# Patient Record
Sex: Female | Born: 2000 | Race: Black or African American | Hispanic: No | Marital: Single | Smoking: Never smoker
Health system: Southern US, Community
[De-identification: ages and names within clinical notes are randomized; demographics above are authoritative.]

## PROBLEM LIST (undated history)

## (undated) ENCOUNTER — Emergency Department (HOSPITAL_COMMUNITY): Admission: EM | Payer: Self-pay

## (undated) ENCOUNTER — Ambulatory Visit (HOSPITAL_COMMUNITY): Admission: EM | Payer: MEDICAID | Source: Home / Self Care | Attending: Family Medicine | Admitting: Family Medicine

## (undated) DIAGNOSIS — F209 Schizophrenia, unspecified: Secondary | ICD-10-CM

## (undated) HISTORY — DX: Schizophrenia, unspecified: F20.9

---

## 2020-10-15 ENCOUNTER — Encounter (HOSPITAL_COMMUNITY): Payer: Self-pay | Admitting: Emergency Medicine

## 2020-10-15 ENCOUNTER — Other Ambulatory Visit: Payer: Self-pay

## 2020-10-15 ENCOUNTER — Inpatient Hospital Stay (HOSPITAL_COMMUNITY)
Admission: EM | Admit: 2020-10-15 | Discharge: 2020-10-18 | DRG: 346 | Disposition: A | Discharge status: Home / Self Care | Payer: Medicaid Other | Attending: General Surgery | Admitting: General Surgery

## 2020-10-15 ENCOUNTER — Emergency Department (HOSPITAL_COMMUNITY): Payer: Medicaid Other

## 2020-10-15 DIAGNOSIS — K611 Rectal abscess: Principal | ICD-10-CM | POA: Diagnosis present

## 2020-10-15 DIAGNOSIS — F17200 Nicotine dependence, unspecified, uncomplicated: Secondary | ICD-10-CM | POA: Diagnosis present

## 2020-10-15 DIAGNOSIS — Z20822 Contact with and (suspected) exposure to covid-19: Secondary | ICD-10-CM | POA: Diagnosis present

## 2020-10-15 LAB — CBC WITH DIFFERENTIAL/PLATELET
Abs Immature Granulocytes: 0.04 10*3/uL (ref 0.00–0.07)
Basophils Absolute: 0 10*3/uL (ref 0.0–0.1)
Basophils Relative: 0 %
Eosinophils Absolute: 0.1 10*3/uL (ref 0.0–0.5)
Eosinophils Relative: 1 %
HCT: 36.3 % (ref 36.0–46.0)
Hemoglobin: 11.3 g/dL — ABNORMAL LOW (ref 12.0–15.0)
Immature Granulocytes: 0 %
Lymphocytes Relative: 11 %
Lymphs Abs: 1.5 10*3/uL (ref 0.7–4.0)
MCH: 26.9 pg (ref 26.0–34.0)
MCHC: 31.1 g/dL (ref 30.0–36.0)
MCV: 86.4 fL (ref 80.0–100.0)
Monocytes Absolute: 1.1 10*3/uL — ABNORMAL HIGH (ref 0.1–1.0)
Monocytes Relative: 8 %
Neutro Abs: 10.6 10*3/uL — ABNORMAL HIGH (ref 1.7–7.7)
Neutrophils Relative %: 80 %
Platelets: 240 10*3/uL (ref 150–400)
RBC: 4.2 MIL/uL (ref 3.87–5.11)
RDW: 12.4 % (ref 11.5–15.5)
WBC: 13.4 10*3/uL — ABNORMAL HIGH (ref 4.0–10.5)
nRBC: 0 % (ref 0.0–0.2)

## 2020-10-15 LAB — HCG, QUANTITATIVE, PREGNANCY: hCG, Beta Chain, Quant, S: 1 m[IU]/mL (ref <5)

## 2020-10-15 LAB — BASIC METABOLIC PANEL
Anion gap: 10 (ref 5–15)
BUN: 9 mg/dL (ref 6–20)
CO2: 20 mmol/L — ABNORMAL LOW (ref 22–32)
Calcium: 8.9 mg/dL (ref 8.9–10.3)
Chloride: 106 mmol/L (ref 98–111)
Creatinine, Ser: 0.57 mg/dL (ref 0.44–1.00)
GFR, Estimated: >60 mL/min (ref >60)
Glucose, Bld: 92 mg/dL (ref 70–99)
Potassium: 3.6 mmol/L (ref 3.5–5.1)
Sodium: 136 mmol/L (ref 135–145)

## 2020-10-15 LAB — I-STAT BETA HCG BLOOD, ED (MC, WL, AP ONLY): I-stat hCG, quantitative: 6.8 m[IU]/mL — ABNORMAL HIGH (ref <5)

## 2020-10-15 MED ORDER — ONDANSETRON 4 MG PO TBDP: 4.0000 mg | ORAL_TABLET | Freq: Four times a day (QID) | ORAL | Status: DC | PRN

## 2020-10-15 MED ORDER — ZOLPIDEM TARTRATE 5 MG PO TABS: 5.0000 mg | ORAL_TABLET | Freq: Every evening | ORAL | Status: DC | PRN

## 2020-10-15 MED ORDER — CLINDAMYCIN PHOSPHATE 600 MG/50ML IV SOLN
600.0000 mg | Freq: Once | INTRAVENOUS | Status: AC
  Administered 2020-10-15: 600 mg via INTRAVENOUS
  Filled 2020-10-15: qty 50

## 2020-10-15 MED ORDER — FENTANYL CITRATE (PF) 100 MCG/2ML IJ SOLN
50.0000 ug | Freq: Once | INTRAMUSCULAR | Status: AC
  Administered 2020-10-15: 50 ug via INTRAVENOUS
  Filled 2020-10-15: qty 2

## 2020-10-15 MED ORDER — SODIUM CHLORIDE 0.9 % IV BOLUS
1000.0000 mL | Freq: Once | INTRAVENOUS | Status: AC
  Administered 2020-10-15: 1000 mL via INTRAVENOUS

## 2020-10-15 MED ORDER — MORPHINE SULFATE (PF) 4 MG/ML IV SOLN: 4.0000 mg | INTRAVENOUS | Status: DC | PRN

## 2020-10-15 MED ORDER — ACETAMINOPHEN 650 MG RE SUPP: 650.0000 mg | Freq: Four times a day (QID) | RECTAL | Status: DC | PRN

## 2020-10-15 MED ORDER — OXYCODONE HCL 5 MG PO TABS
5.0000 mg | ORAL_TABLET | ORAL | Status: DC | PRN
  Administered 2020-10-16 – 2020-10-17 (×3): 10 mg via ORAL
  Filled 2020-10-15 (×3): qty 2

## 2020-10-15 MED ORDER — ACETAMINOPHEN 325 MG PO TABS
650.0000 mg | ORAL_TABLET | Freq: Four times a day (QID) | ORAL | Status: DC | PRN
  Filled 2020-10-15: qty 2

## 2020-10-15 MED ORDER — ONDANSETRON HCL 4 MG/2ML IJ SOLN
4.0000 mg | Freq: Once | INTRAMUSCULAR | Status: AC
  Administered 2020-10-15: 4 mg via INTRAVENOUS
  Filled 2020-10-15: qty 2

## 2020-10-15 MED ORDER — POTASSIUM CHLORIDE IN NACL 20-0.9 MEQ/L-% IV SOLN
INTRAVENOUS | Status: DC
  Filled 2020-10-15 (×4): qty 1000

## 2020-10-15 MED ORDER — DIPHENHYDRAMINE HCL 50 MG/ML IJ SOLN: 25.0000 mg | Freq: Four times a day (QID) | INTRAMUSCULAR | Status: DC | PRN

## 2020-10-15 MED ORDER — MORPHINE SULFATE (PF) 4 MG/ML IV SOLN
4.0000 mg | Freq: Once | INTRAVENOUS | Status: AC
  Administered 2020-10-15: 4 mg via INTRAVENOUS
  Filled 2020-10-15: qty 1

## 2020-10-15 MED ORDER — ONDANSETRON HCL 4 MG/2ML IJ SOLN: 4.0000 mg | Freq: Four times a day (QID) | INTRAMUSCULAR | Status: DC | PRN

## 2020-10-15 MED ORDER — IOHEXOL 300 MG/ML  SOLN
100.0000 mL | Freq: Once | INTRAMUSCULAR | Status: AC | PRN
  Administered 2020-10-15: 100 mL via INTRAVENOUS

## 2020-10-15 MED ORDER — PIPERACILLIN-TAZOBACTAM 3.375 G IVPB
3.3750 g | Freq: Three times a day (TID) | INTRAVENOUS | Status: DC
  Administered 2020-10-16 – 2020-10-17 (×4): 3.375 g via INTRAVENOUS
  Filled 2020-10-15 (×4): qty 50

## 2020-10-15 MED ORDER — DIPHENHYDRAMINE HCL 25 MG PO CAPS: 25.0000 mg | ORAL_CAPSULE | Freq: Four times a day (QID) | ORAL | Status: DC | PRN

## 2020-10-15 NOTE — ED Notes (Addendum)
Pt removed from monitor by CT staff.

## 2020-10-15 NOTE — ED Triage Notes (Signed)
Pt coming from home. Complaint of abscess that is not getting better. Pt treated at [facility] ed earlier this week and prescribed antibiotics but is not feeling better. VSS. NAD.

## 2020-10-15 NOTE — H&P (Signed)
Sarah Solis is an 19 y.o. female.   Chief Complaint: perirectal pain HPI: 19 year old female presents to the emergency department complaining of left perirectal swelling and pain.  She was seen in the emergency department a couple days ago and diagnosed with suspected perirectal abscess and given clindamycin.  She continues to complain of pain.  She has had a small amount of drainage from the area as well.  It is painful for her to go to the bathroom.  She underwent evaluation in the emergency department which included laboratory studies showing white blood cell count 13,400.  She underwent CT scan of the abdomen and pelvis which shows a 6 cm left perirectal abscess.  I was asked to see her regarding surgical management.  She reports a history of infected cysts in her axilla in the past as well as an infected pilonidal cyst.  History reviewed. No pertinent past medical history.  History reviewed. No pertinent surgical history.  No family history on file. Social History:  has no history on file for tobacco use, alcohol use, and drug use.  Allergies: No Known Allergies  (Not in a hospital admission)   Results for orders placed or performed during the hospital encounter of 10/15/20 (from the past 48 hour(s))  Basic metabolic panel     Status: Abnormal   Collection Time: 10/15/20  7:19 PM  Result Value Ref Range   Sodium 136 135 - 145 mmol/L   Potassium 3.6 3.5 - 5.1 mmol/L   Chloride 106 98 - 111 mmol/L   CO2 20 (L) 22 - 32 mmol/L   Glucose, Bld 92 70 - 99 mg/dL    Comment: Glucose reference range applies only to samples taken after fasting for at least 8 hours.   BUN 9 6 - 20 mg/dL   Creatinine, Ser 1.190.57 0.44 - 1.00 mg/dL   Calcium 8.9 8.9 - 14.710.3 mg/dL   GFR, Estimated >82>60 >95>60 mL/min    Comment: (NOTE) Calculated using the CKD-EPI Creatinine Equation (2021)    Anion gap 10 5 - 15    Comment: Performed at [facility], [address] [place], [place] [address]  CBC with  Differential     Status: Abnormal   Collection Time: 10/15/20  7:19 PM  Result Value Ref Range   WBC 13.4 (H) 4.0 - 10.5 K/uL   RBC 4.20 3.87 - 5.11 MIL/uL   Hemoglobin 11.3 (L) 12.0 - 15.0 g/dL   HCT 86.536.3 36 - 46 %   MCV 86.4 80.0 - 100.0 fL   MCH 26.9 26.0 - 34.0 pg   MCHC 31.1 30.0 - 36.0 g/dL   RDW 78.412.4 69.611.5 - 29.515.5 %   Platelets 240 150 - 400 K/uL   nRBC 0.0 0.0 - 0.2 %   Neutrophils Relative % 80 %   Neutro Abs 10.6 (H) 1.7 - 7.7 K/uL   Lymphocytes Relative 11 %   Lymphs Abs 1.5 0.7 - 4.0 K/uL   Monocytes Relative 8 %   Monocytes Absolute 1.1 (H) 0.1 - 1.0 K/uL   Eosinophils Relative 1 %   Eosinophils Absolute 0.1 0.0 - 0.5 K/uL   Basophils Relative 0 %   Basophils Absolute 0.0 0.0 - 0.1 K/uL   Immature Granulocytes 0 %   Abs Immature Granulocytes 0.04 0.00 - 0.07 K/uL    Comment: Performed at [facility], [address] [place], [place] [address]  hCG, quantitative, pregnancy     Status: None   Collection Time:  10/15/20  7:26 PM  Result Value Ref Range   hCG, Beta Chain, Quant, S 1 <5 mIU/mL    Comment:          GEST. AGE      CONC.  (mIU/mL)   <=1 WEEK        5 - 50     2 WEEKS       50 - 500     3 WEEKS       100 - 10,000     4 WEEKS     1,000 - 30,000     5 WEEKS     3,500 - 115,000   6-8 WEEKS     12,000 - 270,000    12 WEEKS     15,000 - 220,000        FEMALE AND NON-PREGNANT FEMALE:     LESS THAN 5 mIU/mL Performed at [facility], [address], [place], [place] [address]   I-Stat Beta hCG blood, ED ([facility], [facility], [facility] only)     Status: Abnormal   Collection Time: 10/15/20  8:46 PM  Result Value Ref Range   I-stat hCG, quantitative 6.8 (H) <5 mIU/mL   Comment 3            Comment:   GEST. AGE      CONC.  (mIU/mL)   <=1 WEEK        5 - 50     2 WEEKS       50 - 500     3 WEEKS       100 - 10,000     4 WEEKS     1,000 - 30,000        FEMALE AND NON-PREGNANT FEMALE:     LESS THAN 5 mIU/mL    CT ABDOMEN PELVIS W CONTRAST  Result Date:  10/15/2020 CLINICAL DATA:  Rectal abscess, history of the same EXAM: CT ABDOMEN AND PELVIS WITH CONTRAST TECHNIQUE: Multidetector CT imaging of the abdomen and pelvis was performed using the standard protocol following bolus administration of intravenous contrast. CONTRAST:  OMNIPAQUE IOHEXOL 300 MG/ML  SOLN COMPARISON:  None. FINDINGS: Lower chest: Lung bases are clear. Normal heart size. No pericardial effusion. Hepatobiliary: No worrisome focal liver lesions. Smooth liver surface contour. Normal hepatic attenuation. Normal gallbladder and biliary tree. Pancreas: No pancreatic ductal dilatation or surrounding inflammatory changes. Spleen: Normal in size. No concerning splenic lesions. Adrenals/Urinary Tract: Normal adrenal glands. Kidneys are normally located with symmetric enhancement. Tiny subcentimeter hypoattenuating focus in the upper pole right kidney too small to fully characterize on CT imaging but statistically likely benign. No suspicious renal lesion, urolithiasis or hydronephrosis. Urinary bladder is largely decompressed at the time of exam and therefore poorly evaluated by CT imaging. No gross abnormality is seen. Stomach/Bowel: Distal esophagus, stomach and duodenum are unremarkable. No small bowel thickening or dilatation. A normal appendix is visualized. No colonic dilatation or wall thickening. No evidence of obstruction. Circumferential thickening of the rectum asymmetric thickening of the left levator plate and small amount of fluid and stranding within the left intersphincteric plane. This is contiguous with findings along the left gluteal cleft/ischioanal fossa as described below. Vascular/Lymphatic: No significant vascular findings are present. Likely reactive inguinal nodes are present. Several measuring up to 1 cm short axis. Reproductive: Anteverted uterus. Normal follicles present in both ovaries including what is likely a collapsing corpus luteum in the left ovary. Other: Extensive  skin thickening and subcutaneous fat  stranding extending predominantly along the left gluteal cleft with a partially rim enhancing heterogeneous attenuation collection in the subcutaneous tissues measuring approximately 6.3 x 3.4 x 5.3 cm in size extending from the gluteal cleft anteriorly towards the ischioanal fossa with surrounding phlegmonous change and extension to the left levator plate. There is asymmetric thickening and inflammation along the left intersphincteric plane and conspicuous loss of discernible fat plane between rectum at the 3 o'clock position and adjacent levator plate. Some low-attenuation free fluid is seen in the pelvis, nonspecific. May be reactive or physiologic in a reproductive age female. No free air is evident. Some mild focal sites of dermal thickening noted along the mons pubis. Correlate with visual inspection. No bowel containing hernias. Musculoskeletal: No acute osseous abnormality or suspicious osseous lesion. Asymmetric changes of th the left levator plate, as above. No intramuscular abscess or collection is seen. IMPRESSION: 1. Extensive skin thickening and subcutaneous fat stranding extending predominantly along the left gluteal cleft with a partially rim enhancing collection in the subcutaneous tissues measuring approximately 6.3 x 3.4 x 5.3 cm in size concerning for a perirectal abscess which extends anteriorly towards the ischioanal fossa and left levator plate. Thickening and inflammation along the left intersphincteric plane and conspicuous loss of discernible fat plane between rectum at the 3 o'clock position and adjacent levator plate. Findings are concerning for a perianal abscess with possible transsphincteric extension within the limitations of CT imaging. 2. Likely reactive inguinal nodes. 3. Small volume low-attenuation free fluid in the pelvis, nonspecific, but may be reactive or physiologic in a reproductive age female. 4. Likely collapsing corpus luteum in the  left ovary. Electronically Signed   By: Kreg Shropshire M.D.   On: 10/15/2020 22:37    Review of Systems  Constitutional: Negative.   HENT: Negative.   Eyes: Negative.   Respiratory: Negative.   Cardiovascular: Negative.   Gastrointestinal: Negative.   Endocrine: Negative.   Genitourinary: Negative.   Musculoskeletal:       See HPI  Allergic/Immunologic: Negative.   Neurological: Negative.   Hematological: Negative.   Psychiatric/Behavioral: Negative.     Blood pressure (!) 119/95, pulse 96, temperature 98.7 F (37.1 C), temperature source Oral, resp. rate 18, height 5\' 6"  (1.676 m), weight 74.8 kg, SpO2 98 %. Physical Exam Constitutional:      Appearance: Normal appearance.  HENT:     Head: Normocephalic.     Right Ear: External ear normal.     Left Ear: External ear normal.     Mouth/Throat:     Mouth: Mucous membranes are moist.  Eyes:     Pupils: Pupils are equal, round, and reactive to light.  Cardiovascular:     Rate and Rhythm: Normal rate and regular rhythm.     Pulses: Normal pulses.     Heart sounds: Normal heart sounds. No murmur heard.   Pulmonary:     Effort: Pulmonary effort is normal. No respiratory distress.     Breath sounds: Normal breath sounds. No wheezing or rhonchi.  Abdominal:     General: Abdomen is flat. There is no distension.     Palpations: Abdomen is soft. There is no mass.     Tenderness: There is no abdominal tenderness. There is no guarding.  Genitourinary:    Comments: Large area of left perirectal fluctuance with a small amount of serosanguineous drainage and significant tenderness Musculoskeletal:     Cervical back: Normal range of motion and neck supple.  Skin:    General:  Skin is warm and dry.     Capillary Refill: Capillary refill takes less than 2 seconds.  Neurological:     General: No focal deficit present.     Mental Status: She is alert and oriented to person, place, and time.  Psychiatric:        Mood and Affect: Mood  normal.      Assessment/Plan Large left perirectal abscess -admit, begin IV Zosyn, will plan incision and drainage in the operating room under general anesthesia by Dr. Magnus Ivan in the morning.  I discussed the procedure, risks, and benefits with her and her mother.  She is agreeable.  Liz Malady, MD 10/15/2020, 11:25 PM

## 2020-10-15 NOTE — ED Provider Notes (Signed)
[facility] EMERGENCY DEPARTMENT Provider Note   CSN: [id number] Arrival date & time: 10/15/20  1707     History Chief Complaint  Patient presents with  . Abscess    Sarah SalisburyQuanna Solis is a 19 y.o. female who presents for evaluation of rectal pain.  He states is been going on for about for 5 days.  She saw an emergency department in [place] and was told that she had an abscess but it was big enough to drain.  She was started on clindamycin which she states she has been taking but states that is continued to get bigger.  She states that the area has become more painful, and larger, more swollen.  She states that it hurts when she tries to sit on it when she tries to have a bowel movement.  She states that she has not noted any fevers.  She denies any abdominal pain, urinary complaints.   The history is provided by the patient.       History reviewed. No pertinent past medical history.  Patient Active Problem List   Diagnosis Date Noted  . Perirectal abscess 10/15/2020    History reviewed. No pertinent surgical history.   OB History   No obstetric history on file.     No family history on file.  Social History   Tobacco Use  . Smoking status: Not on file  Substance Use Topics  . Alcohol use: Not on file  . Drug use: Not on file    Home Medications Prior to Admission medications   Medication Sig Start Date End Date Taking? Authorizing Provider  clindamycin (CLEOCIN) 150 MG capsule Take 150 mg by mouth 3 (three) times daily. 10/13/20  Yes [provider]    Allergies    Patient has no known allergies.  Review of Systems   Review of Systems  Constitutional: Negative for fever.  Respiratory: Negative for shortness of breath.   Cardiovascular: Negative for chest pain.  Gastrointestinal: Negative for abdominal pain, nausea and vomiting.  Genitourinary: Negative for dysuria and hematuria.  Skin: Positive for color change.  Neurological:  Negative for headaches.    Physical Exam Updated Vital Signs BP 105/61   Pulse 92   Temp 98.7 F (37.1 C) (Oral)   Resp 18   Ht 5\' 6"  (1.676 m)   Wt 74.8 kg   SpO2 100%   BMI 26.63 kg/m   Physical Exam Vitals and nursing note reviewed.  Constitutional:      Appearance: Normal appearance. She is well-developed.  HENT:     Head: Normocephalic and atraumatic.  Eyes:     General: Lids are normal.     Conjunctiva/sclera: Conjunctivae normal.     Pupils: Pupils are equal, round, and reactive to light.  Cardiovascular:     Rate and Rhythm: Normal rate and regular rhythm.     Pulses: Normal pulses.     Heart sounds: Normal heart sounds. No murmur heard.  No friction rub. No gallop.   Pulmonary:     Effort: Pulmonary effort is normal.     Breath sounds: Normal breath sounds.  Abdominal:     Palpations: Abdomen is soft. Abdomen is not rigid.     Tenderness: There is no abdominal tenderness. There is no guarding.  Genitourinary:      Comments: The exam was performed with a chaperone present.  Large area of erythema, warmth, induration with medial fluctuance noted to the right gluteal cheek.  There is an area  that is adjacent to the anus.  Digital rectal exam shows tenderness.  No mass palpated. Musculoskeletal:        General: Normal range of motion.     Cervical back: Full passive range of motion without pain.  Skin:    General: Skin is warm and dry.     Capillary Refill: Capillary refill takes less than 2 seconds.  Neurological:     Mental Status: She is alert and oriented to person, place, and time.  Psychiatric:        Speech: Speech normal.     ED Results / Procedures / Treatments   Labs (all labs ordered are listed, but only abnormal results are displayed) Labs Reviewed  BASIC METABOLIC PANEL - Abnormal; Notable for the following components:      Result Value   CO2 20 (*)    All other components within normal limits  CBC WITH DIFFERENTIAL/PLATELET - Abnormal;  Notable for the following components:   WBC 13.4 (*)    Hemoglobin 11.3 (*)    Neutro Abs 10.6 (*)    Monocytes Absolute 1.1 (*)    All other components within normal limits  I-STAT BETA HCG BLOOD, ED ([facility], [facility], [facility] ONLY) - Abnormal; Notable for the following components:   I-stat hCG, quantitative 6.8 (*)    All other components within normal limits  RESPIRATORY PANEL BY RT PCR (FLU A&B, COVID)  HCG, QUANTITATIVE, PREGNANCY  HIV ANTIBODY (ROUTINE TESTING W REFLEX)  BASIC METABOLIC PANEL  CBC    EKG None  Radiology CT ABDOMEN PELVIS W CONTRAST  Result Date: 10/15/2020 CLINICAL DATA:  Rectal abscess, history of the same EXAM: CT ABDOMEN AND PELVIS WITH CONTRAST TECHNIQUE: Multidetector CT imaging of the abdomen and pelvis was performed using the standard protocol following bolus administration of intravenous contrast. CONTRAST:  OMNIPAQUE IOHEXOL 300 MG/ML  SOLN COMPARISON:  None. FINDINGS: Lower chest: Lung bases are clear. Normal heart size. No pericardial effusion. Hepatobiliary: No worrisome focal liver lesions. Smooth liver surface contour. Normal hepatic attenuation. Normal gallbladder and biliary tree. Pancreas: No pancreatic ductal dilatation or surrounding inflammatory changes. Spleen: Normal in size. No concerning splenic lesions. Adrenals/Urinary Tract: Normal adrenal glands. Kidneys are normally located with symmetric enhancement. Tiny subcentimeter hypoattenuating focus in the upper pole right kidney too small to fully characterize on CT imaging but statistically likely benign. No suspicious renal lesion, urolithiasis or hydronephrosis. Urinary bladder is largely decompressed at the time of exam and therefore poorly evaluated by CT imaging. No gross abnormality is seen. Stomach/Bowel: Distal esophagus, stomach and duodenum are unremarkable. No small bowel thickening or dilatation. A normal appendix is visualized. No colonic dilatation or wall thickening. No evidence of  obstruction. Circumferential thickening of the rectum asymmetric thickening of the left levator plate and small amount of fluid and stranding within the left intersphincteric plane. This is contiguous with findings along the left gluteal cleft/ischioanal fossa as described below. Vascular/Lymphatic: No significant vascular findings are present. Likely reactive inguinal nodes are present. Several measuring up to 1 cm short axis. Reproductive: Anteverted uterus. Normal follicles present in both ovaries including what is likely a collapsing corpus luteum in the left ovary. Other: Extensive skin thickening and subcutaneous fat stranding extending predominantly along the left gluteal cleft with a partially rim enhancing heterogeneous attenuation collection in the subcutaneous tissues measuring approximately 6.3 x 3.4 x 5.3 cm in size extending from the gluteal cleft anteriorly towards the ischioanal fossa with surrounding phlegmonous change and extension to the  left levator plate. There is asymmetric thickening and inflammation along the left intersphincteric plane and conspicuous loss of discernible fat plane between rectum at the 3 o'clock position and adjacent levator plate. Some low-attenuation free fluid is seen in the pelvis, nonspecific. May be reactive or physiologic in a reproductive age female. No free air is evident. Some mild focal sites of dermal thickening noted along the mons pubis. Correlate with visual inspection. No bowel containing hernias. Musculoskeletal: No acute osseous abnormality or suspicious osseous lesion. Asymmetric changes of th the left levator plate, as above. No intramuscular abscess or collection is seen. IMPRESSION: 1. Extensive skin thickening and subcutaneous fat stranding extending predominantly along the left gluteal cleft with a partially rim enhancing collection in the subcutaneous tissues measuring approximately 6.3 x 3.4 x 5.3 cm in size concerning for a perirectal abscess which  extends anteriorly towards the ischioanal fossa and left levator plate. Thickening and inflammation along the left intersphincteric plane and conspicuous loss of discernible fat plane between rectum at the 3 o'clock position and adjacent levator plate. Findings are concerning for a perianal abscess with possible transsphincteric extension within the limitations of CT imaging. 2. Likely reactive inguinal nodes. 3. Small volume low-attenuation free fluid in the pelvis, nonspecific, but may be reactive or physiologic in a reproductive age female. 4. Likely collapsing corpus luteum in the left ovary. Electronically Signed   By: Kreg Shropshire M.D.   On: 10/15/2020 22:37    Procedures Procedures (including critical care time)  Medications Ordered in ED Medications  clindamycin (CLEOCIN) IVPB 600 mg (600 mg Intravenous New Bag/Given 10/15/20 2335)  0.9 % NaCl with KCl 20 mEq/ L  infusion (has no administration in time range)  acetaminophen (TYLENOL) tablet 650 mg (has no administration in time range)    Or  acetaminophen (TYLENOL) suppository 650 mg (has no administration in time range)  oxyCODONE (Oxy IR/ROXICODONE) immediate release tablet 5-10 mg (has no administration in time range)  morphine 4 MG/ML injection 4 mg (has no administration in time range)  zolpidem (AMBIEN) tablet 5 mg (has no administration in time range)  diphenhydrAMINE (BENADRYL) capsule 25 mg (has no administration in time range)    Or  diphenhydrAMINE (BENADRYL) injection 25 mg (has no administration in time range)  ondansetron (ZOFRAN-ODT) disintegrating tablet 4 mg (has no administration in time range)    Or  ondansetron (ZOFRAN) injection 4 mg (has no administration in time range)  piperacillin-tazobactam (ZOSYN) IVPB 3.375 g (has no administration in time range)  fentaNYL (SUBLIMAZE) injection 50 mcg (50 mcg Intravenous Given 10/15/20 1917)  ondansetron (ZOFRAN) injection 4 mg (4 mg Intravenous Given 10/15/20 1917)  iohexol  (OMNIPAQUE) 300 MG/ML solution 100 mL (100 mLs Intravenous Contrast Given 10/15/20 2203)  ondansetron (ZOFRAN) injection 4 mg (4 mg Intravenous Given 10/15/20 2323)  morphine 4 MG/ML injection 4 mg (4 mg Intravenous Given 10/15/20 2322)  sodium chloride 0.9 % bolus 1,000 mL (1,000 mLs Intravenous New Bag/Given 10/15/20 2335)    ED Course  I have reviewed the triage vital signs and the nursing notes.  Pertinent labs & imaging results that were available during my care of the patient were reviewed by me and considered in my medical decision making (see chart for details).  Clinical Course as of Oct 16 2355  Tue Oct 15, 2020  1937 CT ABDOMEN PELVIS W CONTRAST [KK]  2216 CT ABDOMEN PELVIS W CONTRAST [KK]    Clinical Course User Index [KK] Criss Rosales, Student-PA  MDM Rules/Calculators/A&P                          19 year old female who presents for evaluation of rectal pain.  History of pain for 5 days.  Sought ED 3 days ago and was started on antibiotics.  States that despite taking antibiotics, the areas become more painful, larger.  No fevers.  Nausea arrival, she is afebrile, nontoxic-appearing.  Vital signs are stable.  On exam, she has a large area of warmth, erythema, induration noted left gluteus that is adjacent to the anus.  There appears to be some medial fluctuance.  Digital rectal exam shows tenderness.  No mass palpated.  Given her tenderness, we will plan for a CT scan to evaluate the extent of abscess.  BMP is normal rate and creatinine.  CBC shows leukocytosis of 13.4.  Hemoglobin is 11.3.  She has an evidence of a 6 x 3 x 5 cm perirectal abscess that extends anteriorly toward the ischio anal fossa and left levator plate.  She also has thickening and fluid noted to the left intersphincteric plane.  Findings are concerning for perianal abscess with possible Tran sphincteric extension.  Discussed patient with Dr. Janee Morn (General Surgery) who will admit the  patient.  Portions of this note were generated with Scientist, clinical (histocompatibility and immunogenetics). Dictation errors may occur despite best attempts at proofreading. Final Clinical Impression(s) / ED Diagnoses Final diagnoses:  Perirectal abscess    Rx / DC Orders ED Discharge Orders    None       Rosana Hoes 10/15/20 2356    Terald Sleeper, MD 10/16/20 0028

## 2020-10-16 ENCOUNTER — Encounter (HOSPITAL_COMMUNITY): Payer: Self-pay | Admitting: General Surgery

## 2020-10-16 ENCOUNTER — Observation Stay (HOSPITAL_COMMUNITY): Payer: Medicaid Other | Admitting: Certified Registered"

## 2020-10-16 ENCOUNTER — Encounter (HOSPITAL_COMMUNITY): Admission: EM | Disposition: A | Discharge status: Home / Self Care | Payer: Self-pay | Source: Home / Self Care

## 2020-10-16 HISTORY — PX: INCISION AND DRAINAGE PERIRECTAL ABSCESS: SHX1804

## 2020-10-16 LAB — BASIC METABOLIC PANEL
Anion gap: 11 (ref 5–15)
BUN: 7 mg/dL (ref 6–20)
CO2: 19 mmol/L — ABNORMAL LOW (ref 22–32)
Calcium: 8.1 mg/dL — ABNORMAL LOW (ref 8.9–10.3)
Chloride: 107 mmol/L (ref 98–111)
Creatinine, Ser: 0.58 mg/dL (ref 0.44–1.00)
GFR, Estimated: >60 mL/min (ref >60)
Glucose, Bld: 86 mg/dL (ref 70–99)
Potassium: 3.2 mmol/L — ABNORMAL LOW (ref 3.5–5.1)
Sodium: 137 mmol/L (ref 135–145)

## 2020-10-16 LAB — CBC
HCT: 33.9 % — ABNORMAL LOW (ref 36.0–46.0)
Hemoglobin: 10.6 g/dL — ABNORMAL LOW (ref 12.0–15.0)
MCH: 27.3 pg (ref 26.0–34.0)
MCHC: 31.3 g/dL (ref 30.0–36.0)
MCV: 87.4 fL (ref 80.0–100.0)
Platelets: 217 10*3/uL (ref 150–400)
RBC: 3.88 MIL/uL (ref 3.87–5.11)
RDW: 12.6 % (ref 11.5–15.5)
WBC: 12.9 10*3/uL — ABNORMAL HIGH (ref 4.0–10.5)
nRBC: 0 % (ref 0.0–0.2)

## 2020-10-16 LAB — HCG, QUANTITATIVE, PREGNANCY: hCG, Beta Chain, Quant, S: 1 m[IU]/mL (ref <5)

## 2020-10-16 LAB — RESPIRATORY PANEL BY RT PCR (FLU A&B, COVID)
Influenza A by PCR: NEGATIVE
Influenza B by PCR: NEGATIVE
SARS Coronavirus 2 by RT PCR: NEGATIVE

## 2020-10-16 LAB — HIV ANTIBODY (ROUTINE TESTING W REFLEX): HIV Screen 4th Generation wRfx: NONREACTIVE

## 2020-10-16 SURGERY — INCISION AND DRAINAGE, ABSCESS, PERIRECTAL
Anesthesia: General | Site: Perineum | Laterality: Left

## 2020-10-16 MED ORDER — OXYCODONE HCL 5 MG/5ML PO SOLN: 5.0000 mg | Freq: Once | ORAL | Status: DC | PRN

## 2020-10-16 MED ORDER — FENTANYL CITRATE (PF) 100 MCG/2ML IJ SOLN
INTRAMUSCULAR | Status: AC
  Administered 2020-10-16: 50 ug via INTRAVENOUS
  Filled 2020-10-16: qty 2

## 2020-10-16 MED ORDER — BUPIVACAINE HCL (PF) 0.25 % IJ SOLN
INTRAMUSCULAR | Status: AC
  Filled 2020-10-16: qty 30

## 2020-10-16 MED ORDER — SUFENTANIL CITRATE 50 MCG/ML IV SOLN
INTRAVENOUS | Status: AC
  Filled 2020-10-16: qty 1

## 2020-10-16 MED ORDER — FENTANYL CITRATE (PF) 100 MCG/2ML IJ SOLN: 25.0000 ug | INTRAMUSCULAR | Status: DC | PRN

## 2020-10-16 MED ORDER — GLYCOPYRROLATE 0.2 MG/ML IJ SOLN
INTRAMUSCULAR | Status: DC | PRN
  Administered 2020-10-16: .2 mg via INTRAVENOUS

## 2020-10-16 MED ORDER — ACETAMINOPHEN 160 MG/5ML PO SOLN: 1000.0000 mg | Freq: Once | ORAL | Status: DC | PRN

## 2020-10-16 MED ORDER — SUFENTANIL CITRATE 50 MCG/ML IV SOLN
INTRAVENOUS | Status: DC | PRN
  Administered 2020-10-16: 10 ug via INTRAVENOUS

## 2020-10-16 MED ORDER — CHLORHEXIDINE GLUCONATE 0.12 % MT SOLN
OROMUCOSAL | Status: AC
  Filled 2020-10-16: qty 15

## 2020-10-16 MED ORDER — LIDOCAINE HCL (CARDIAC) PF 100 MG/5ML IV SOSY
PREFILLED_SYRINGE | INTRAVENOUS | Status: DC | PRN
  Administered 2020-10-16: 60 mg via INTRATRACHEAL

## 2020-10-16 MED ORDER — PIPERACILLIN-TAZOBACTAM 3.375 G IVPB 30 MIN
3.3750 g | INTRAVENOUS | Status: AC
  Administered 2020-10-16: 3.375 g via INTRAVENOUS
  Filled 2020-10-16: qty 50

## 2020-10-16 MED ORDER — BUPIVACAINE LIPOSOME 1.3 % IJ SUSP
20.0000 mL | INTRAMUSCULAR | Status: AC
  Administered 2020-10-16: 20 mL
  Filled 2020-10-16: qty 20

## 2020-10-16 MED ORDER — ACETAMINOPHEN 500 MG PO TABS: 1000.0000 mg | ORAL_TABLET | Freq: Once | ORAL | Status: DC | PRN

## 2020-10-16 MED ORDER — ONDANSETRON HCL 4 MG/2ML IJ SOLN
INTRAMUSCULAR | Status: DC | PRN
  Administered 2020-10-16: 4 mg via INTRAVENOUS

## 2020-10-16 MED ORDER — CHLORHEXIDINE GLUCONATE 0.12 % MT SOLN: 15.0000 mL | OROMUCOSAL | Status: AC

## 2020-10-16 MED ORDER — MORPHINE SULFATE (PF) 4 MG/ML IV SOLN
4.0000 mg | INTRAVENOUS | Status: DC | PRN
  Administered 2020-10-17: 4 mg via INTRAVENOUS
  Filled 2020-10-16: qty 1

## 2020-10-16 MED ORDER — BUPIVACAINE HCL (PF) 0.25 % IJ SOLN
INTRAMUSCULAR | Status: DC | PRN
  Administered 2020-10-16: 20 mL

## 2020-10-16 MED ORDER — DEXAMETHASONE SODIUM PHOSPHATE 10 MG/ML IJ SOLN
INTRAMUSCULAR | Status: DC | PRN
  Administered 2020-10-16: 10 mg via INTRAVENOUS

## 2020-10-16 MED ORDER — LACTATED RINGERS IV SOLN: INTRAVENOUS | Status: DC | PRN

## 2020-10-16 MED ORDER — FENTANYL CITRATE (PF) 100 MCG/2ML IJ SOLN
25.0000 ug | INTRAMUSCULAR | Status: DC | PRN
  Administered 2020-10-16: 50 ug via INTRAVENOUS

## 2020-10-16 MED ORDER — OXYCODONE HCL 5 MG PO TABS: 5.0000 mg | ORAL_TABLET | Freq: Once | ORAL | Status: DC | PRN

## 2020-10-16 MED ORDER — ACETAMINOPHEN 10 MG/ML IV SOLN: 1000.0000 mg | Freq: Once | INTRAVENOUS | Status: DC | PRN

## 2020-10-16 MED ORDER — PROPOFOL 10 MG/ML IV BOLUS
INTRAVENOUS | Status: DC | PRN
  Administered 2020-10-16: 200 mg via INTRAVENOUS

## 2020-10-16 MED ORDER — LACTATED RINGERS IV SOLN: INTRAVENOUS | Status: DC

## 2020-10-16 MED ORDER — OXYCODONE HCL 5 MG PO TABS
ORAL_TABLET | ORAL | Status: AC
  Filled 2020-10-16: qty 1

## 2020-10-16 SURGICAL SUPPLY — 35 items
BNDG CONFORM 2 STRL LF (GAUZE/BANDAGES/DRESSINGS) ×2 IMPLANT
BNDG GAUZE ELAST 4 BULKY (GAUZE/BANDAGES/DRESSINGS) ×2 IMPLANT
CANISTER SUCT 3000ML PPV (MISCELLANEOUS) ×2 IMPLANT
COVER SURGICAL LIGHT HANDLE (MISCELLANEOUS) ×2 IMPLANT
COVER WAND RF STERILE (DRAPES) IMPLANT
DRAPE LAPAROTOMY T 102X78X121 (DRAPES) ×2 IMPLANT
DRAPE UTILITY XL STRL (DRAPES) ×4 IMPLANT
DRSG PAD ABDOMINAL 8X10 ST (GAUZE/BANDAGES/DRESSINGS) ×2 IMPLANT
ELECT CAUTERY BLADE 6.4 (BLADE) ×2 IMPLANT
ELECT REM PT RETURN 9FT ADLT (ELECTROSURGICAL) ×2
ELECTRODE REM PT RTRN 9FT ADLT (ELECTROSURGICAL) ×1 IMPLANT
GAUZE PACKING IODOFORM 1X5 (PACKING) IMPLANT
GAUZE SPONGE 4X4 12PLY STRL (GAUZE/BANDAGES/DRESSINGS) ×2 IMPLANT
GLOVE SURG SIGNA 7.5 PF LTX (GLOVE) ×2 IMPLANT
GOWN STRL REUS W/ TWL LRG LVL3 (GOWN DISPOSABLE) ×1 IMPLANT
GOWN STRL REUS W/ TWL XL LVL3 (GOWN DISPOSABLE) ×1 IMPLANT
GOWN STRL REUS W/TWL LRG LVL3 (GOWN DISPOSABLE) ×1
GOWN STRL REUS W/TWL XL LVL3 (GOWN DISPOSABLE) ×1
KIT BASIN OR (CUSTOM PROCEDURE TRAY) ×2 IMPLANT
KIT TURNOVER KIT B (KITS) ×2 IMPLANT
LEGGING LITHOTOMY PAIR STRL (DRAPES) ×2 IMPLANT
NS IRRIG 1000ML POUR BTL (IV SOLUTION) ×2 IMPLANT
PACK GENERAL/GYN (CUSTOM PROCEDURE TRAY) ×2 IMPLANT
PACK LITHOTOMY IV (CUSTOM PROCEDURE TRAY) IMPLANT
PAD ARMBOARD 7.5X6 YLW CONV (MISCELLANEOUS) ×2 IMPLANT
PENCIL BUTTON HOLSTER BLD 10FT (ELECTRODE) ×2 IMPLANT
SPONGE LAP 18X18 RF (DISPOSABLE) IMPLANT
SWAB COLLECTION DEVICE MRSA (MISCELLANEOUS) IMPLANT
SWAB CULTURE ESWAB REG 1ML (MISCELLANEOUS) IMPLANT
SYR BULB EAR ULCER 3OZ GRN STR (SYRINGE) IMPLANT
TOWEL GREEN STERILE (TOWEL DISPOSABLE) ×2 IMPLANT
TOWEL GREEN STERILE FF (TOWEL DISPOSABLE) ×2 IMPLANT
TUBE CONNECTING 12X1/4 (SUCTIONS) ×2 IMPLANT
UNDERPAD 30X36 HEAVY ABSORB (UNDERPADS AND DIAPERS) ×2 IMPLANT
YANKAUER SUCT BULB TIP NO VENT (SUCTIONS) ×2 IMPLANT

## 2020-10-16 NOTE — ED Notes (Signed)
Abscess to buttocks draining moderate amount of purulent drainage. Applied abd pad to area.

## 2020-10-16 NOTE — Plan of Care (Signed)
  Problem: Education: Goal: Required Educational Video(s) 10/16/2020 1900 by Tobi Bastos, RN Outcome: Progressing 10/16/2020 1513 by Tobi Bastos, RN Outcome: Progressing   Problem: Clinical Measurements: Goal: Ability to maintain clinical measurements within normal limits will improve Outcome: Progressing Goal: Postoperative complications will be avoided or minimized Outcome: Progressing   Problem: Skin Integrity: Goal: Demonstration of wound healing without infection will improve Outcome: Progressing

## 2020-10-16 NOTE — Op Note (Signed)
Procedure Note  Ryeleigh Santore 10/16/2020   Pre-op Diagnosis: left perirectal abscess     Post-op Diagnosis: same  Procedure(s): INCISION AND DRAINAGE LEFT PERIRECTAL ABSCESS  Surgeon(s): Abigail Miyamoto, MD  Anesthesia: General  Staff:  Circulator: Hermelinda Dellen, RN Scrub Person: Molly Maduro D, RN  Estimated Blood Loss: Minimal               Procedure: The patient was brought to operating identifies correct patient.  She is less upon the operating table general anesthesia was induced.  The patient was then placed in lithotomy position.  On her left perianal area near the buttock, she had multiple small wounds.  I ellipsed the skin around this and made one large wound.  I then took this down to the abscess cavity.  Most of the purulence had already drained from the cavity.  I irrigated the cavity with saline.  I anesthetized it circumferentially with Marcaine and Exparel.  I then achieved hemostasis with the cautery.  There appeared to be no communication with the anus.  I then packed the large abscess cavity with a 2 inch Kling.  This was then covered with dry gauze and ABD pads.  The patient tolerated procedure well.  All the counts were correct at the end of the procedure.  The patient was then extubated in the operating room and taken in a stable condition to the recovery room.          Abigail Miyamoto   Date: 10/16/2020  Time: 10:45 AM

## 2020-10-16 NOTE — Plan of Care (Signed)
  Problem: Education: Goal: Required Educational Video(s) Outcome: Progressing   

## 2020-10-16 NOTE — Transfer of Care (Signed)
Immediate Anesthesia Transfer of Care Note  Patient: Sarah Solis  Procedure(s) Performed: IRRIGATION AND DEBRIDEMENT PERIRECTAL ABSCESS (Left Perineum)  Patient Location: PACU  Anesthesia Type:General  Level of Consciousness: awake, alert , oriented and patient cooperative  Airway & Oxygen Therapy: Patient Spontanous Breathing  Post-op Assessment: Report given to RN, Post -op Vital signs reviewed and stable and Patient moving all extremities X 4  Post vital signs: Reviewed and stable  Last Vitals:  Vitals Value Taken Time  BP 104/70 10/16/20 1052  Temp    Pulse 96 10/16/20 1053  Resp 21 10/16/20 1053  SpO2 100 % 10/16/20 1053  Vitals shown include unvalidated device data.  Last Pain:  Vitals:   10/16/20 0858  TempSrc: Oral  PainSc:          Complications: No complications documented.

## 2020-10-16 NOTE — ED Notes (Signed)
Underpad changed due to purulent drainage output. Assisted pt with bedpan.

## 2020-10-16 NOTE — ED Notes (Signed)
Pt requesting supplies to wash. Nurse gave supplies and completed linen change.

## 2020-10-16 NOTE — Discharge Instructions (Signed)
How to Take a Sitz Bath A sitz bath is a warm water bath that may be used to care for your rectum, genital area, or the area between your rectum and genitals (perineum). For a sitz bath, the water only comes up to your hips and covers your buttocks. A sitz bath may done at home in a bathtub or with a portable sitz bath that fits over the toilet. Your health care provider may recommend a sitz bath to help:  Relieve pain and discomfort after delivering a baby.  Relieve pain and itching from hemorrhoids or anal fissures.  Relieve pain after certain surgeries.  Relax muscles that are sore or tight. How to take a sitz bath Take 3-4 sitz baths a day, or as many as told by your health care provider. Bathtub sitz bath To take a sitz bath in a bathtub: 1. Partially fill a bathtub with warm water. The water should be deep enough to cover your hips and buttocks when you are sitting in the tub. 2. If your health care provider told you to put medicine in the water, follow his or her instructions. 3. Sit in the water. 4. Open the tub drain a little, and leave it open during your bath. 5. Turn on the warm water again, enough to replace the water that is draining out. Keep the water running throughout your bath. This helps keep the water at the right level and the right temperature. 6. Soak in the water for 15-20 minutes, or as long as told by your health care provider. 7. When you are done, be careful when you stand up. You may feel dizzy. 8. After the sitz bath, pat yourself dry. Do not rub your skin to dry it.  Over-the-toilet sitz bath To take a sitz bath with an over-the-toilet basin: 1. Follow the manufacturer's instructions. 2. Fill the basin with warm water. 3. If your health care provider told you to put medicine in the water, follow his or her instructions. 4. Sit on the seat. Make sure the water covers your buttocks and perineum. 5. Soak in the water for 15-20 minutes, or as long as told by  your health care provider. 6. After the sitz bath, pat yourself dry. Do not rub your skin to dry it. 7. Clean and dry the basin between uses. 8. Discard the basin if it cracks, or according to the manufacturer's instructions. Contact a health care provider if:  Your symptoms get worse. Do not continue with sitz baths if your symptoms get worse.  You have new symptoms. If this happens, do not continue with sitz baths until you talk with your health care provider. Summary  A sitz bath is a warm water bath in which the water only comes up to your hips and covers your buttocks.  A sitz bath may help relieve itching, relieve pain, and relax muscles that are sore or tight in the lower part of your body, including your genital area.  Take 3-4 sitz baths a day, or as many as told by your health care provider. Soak in the water for 15-20 minutes.  Do not continue with sitz baths if your symptoms get worse. This information is not intended to replace advice given to you by your health care provider. Make sure you discuss any questions you have with your health care provider. Document Revised: 04/24/2019 Document Reviewed: 11/25/2017 Elsevier Patient Education  2020 [employer]  

## 2020-10-16 NOTE — Anesthesia Preprocedure Evaluation (Addendum)
Anesthesia Evaluation  Patient identified by MRN, date of birth, ID band Patient awake    Reviewed: Allergy & Precautions, NPO status , Patient's Chart, lab work & pertinent test results  History of Anesthesia Complications Negative for: history of anesthetic complications  Airway Mallampati: II  TM Distance: >3 FB Neck ROM: Full    Dental  (+) Dental Advisory Given, Teeth Intact   Pulmonary neg recent URI, Current Smoker and Patient abstained from smoking.,  Covid-19 Nucleic Acid Test Results Lab Results      Component                Value               Date                      SARSCOV2NAA              NEGATIVE            10/15/2020          \    breath sounds clear to auscultation       Cardiovascular negative cardio ROS   Rhythm:Regular     Neuro/Psych negative neurological ROS  negative psych ROS   GI/Hepatic negative GI ROS, Neg liver ROS,   Endo/Other  negative endocrine ROS  Renal/GU negative Renal ROS     Musculoskeletal negative musculoskeletal ROS (+)   Abdominal   Peds  Hematology negative hematology ROS (+)   Anesthesia Other Findings   Reproductive/Obstetrics Lab Results      Component                Value               Date                      HCG                      6.8 (H)             10/15/2020             preg test indeterminate, patient with recent spotting which she believed was her menstrual cycle. Discussed with patient and will proceed with surgery, withhold midazolam, will get urine test for confirmation prior to discharge                             Anesthesia Physical Anesthesia Plan  ASA: II  Anesthesia Plan: General   Post-op Pain Management:    Induction: Intravenous  PONV Risk Score and Plan: 2 and Ondansetron and Dexamethasone  Airway Management Planned: Oral ETT  Additional Equipment: None  Intra-op Plan:   Post-operative Plan:  Extubation in OR  Informed Consent: I have reviewed the patients History and Physical, chart, labs and discussed the procedure including the risks, benefits and alternatives for the proposed anesthesia with the patient or authorized representative who has indicated his/her understanding and acceptance.     Dental advisory given  Plan Discussed with: CRNA and Surgeon  Anesthesia Plan Comments:         Anesthesia Quick Evaluation

## 2020-10-16 NOTE — Anesthesia Procedure Notes (Signed)
Procedure Name: LMA Insertion Date/Time: 10/16/2020 10:18 AM Performed by: Melina Schools, CRNA Pre-anesthesia Checklist: Patient identified, Emergency Drugs available, Suction available, Patient being monitored and Timeout performed Patient Re-evaluated:Patient Re-evaluated prior to induction Oxygen Delivery Method: Circle system utilized Preoxygenation: Pre-oxygenation with 100% oxygen Induction Type: IV induction Ventilation: Mask ventilation without difficulty LMA Size: 4.0 Number of attempts: 1 Placement Confirmation: positive ETCO2 and breath sounds checked- equal and bilateral Tube secured with: Tape Dental Injury: Teeth and Oropharynx as per pre-operative assessment

## 2020-10-16 NOTE — ED Notes (Signed)
Pt ambulated to restroom. 

## 2020-10-16 NOTE — Progress Notes (Signed)
Patient ID: Sarah Solis, female   DOB: April 04, 2001, 19 y.o.   MRN: [id number]   Pre Procedure note for inpatients:   Donneisha Beane has been scheduled for Procedure(s): IRRIGATION AND DEBRIDEMENT PERIRECTAL ABSCESS (Left) today. The various methods of treatment have been discussed with the patient. After consideration of the risks, benefits and treatment options the patient has consented to the planned procedure.   The patient has been seen and labs reviewed. There are no changes in the patient's condition to prevent proceeding with the planned procedure today.  Recent labs:  Lab Results  Component Value Date   WBC 12.9 (H) 10/16/2020   HGB 10.6 (L) 10/16/2020   HCT 33.9 (L) 10/16/2020   PLT 217 10/16/2020   GLUCOSE 86 10/16/2020   NA 137 10/16/2020   K 3.2 (L) 10/16/2020   CL 107 10/16/2020   CREATININE 0.58 10/16/2020   BUN 7 10/16/2020   CO2 19 (L) 10/16/2020    Abigail Miyamoto, MD 10/16/2020 9:38 AM

## 2020-10-17 ENCOUNTER — Encounter (HOSPITAL_COMMUNITY): Payer: Self-pay | Admitting: Surgery

## 2020-10-17 DIAGNOSIS — F17200 Nicotine dependence, unspecified, uncomplicated: Secondary | ICD-10-CM | POA: Diagnosis present

## 2020-10-17 DIAGNOSIS — Z20822 Contact with and (suspected) exposure to covid-19: Secondary | ICD-10-CM | POA: Diagnosis present

## 2020-10-17 DIAGNOSIS — K611 Rectal abscess: Secondary | ICD-10-CM | POA: Diagnosis present

## 2020-10-17 MED ORDER — CLINDAMYCIN HCL 300 MG PO CAPS
300.0000 mg | ORAL_CAPSULE | Freq: Three times a day (TID) | ORAL | Status: DC
  Administered 2020-10-17 – 2020-10-18 (×3): 300 mg via ORAL
  Filled 2020-10-17 (×5): qty 1

## 2020-10-17 MED ORDER — MORPHINE SULFATE (PF) 4 MG/ML IV SOLN: 4.0000 mg | INTRAVENOUS | Status: DC | PRN

## 2020-10-17 MED ORDER — ENOXAPARIN SODIUM 40 MG/0.4ML ~~LOC~~ SOLN
40.0000 mg | SUBCUTANEOUS | Status: DC
  Administered 2020-10-17: 40 mg via SUBCUTANEOUS
  Filled 2020-10-17 (×2): qty 0.4

## 2020-10-17 MED ORDER — POLYETHYLENE GLYCOL 3350 17 G PO PACK
17.0000 g | PACK | Freq: Every day | ORAL | Status: DC
  Administered 2020-10-17: 17 g via ORAL
  Filled 2020-10-17 (×2): qty 1

## 2020-10-17 NOTE — Anesthesia Postprocedure Evaluation (Signed)
Anesthesia Post Note  Patient: Sarah Solis  Procedure(s) Performed: IRRIGATION AND DEBRIDEMENT PERIRECTAL ABSCESS (Left Perineum)     Patient location during evaluation: PACU Anesthesia Type: General Level of consciousness: awake and alert Pain management: pain level controlled Vital Signs Assessment: post-procedure vital signs reviewed and stable Respiratory status: spontaneous breathing, nonlabored ventilation, respiratory function stable and patient connected to nasal cannula oxygen Cardiovascular status: blood pressure returned to baseline and stable Postop Assessment: no apparent nausea or vomiting Anesthetic complications: no   No complications documented.  Last Vitals:  Vitals:   10/17/20 0325 10/17/20 0500  BP: (!) 93/47 (!) 95/49  Pulse: (!) 58   Resp: 14   Temp: 36.7 C   SpO2: 98%     Last Pain:  Vitals:   10/17/20 0500  TempSrc:   PainSc: 6                  Logun Colavito

## 2020-10-17 NOTE — Progress Notes (Signed)
1 Day Post-Op  Subjective: Having pain as expected, but no other complaints.  Repeat hcg negative.  ROS: See above, otherwise other systems negative  Objective: Vital signs in last 24 hours: Temp:  [97.7 F (36.5 C)-99 F (37.2 C)] 98 F (36.7 C) (11/11 0325) Pulse Rate:  [58-95] 58 (11/11 0325) Resp:  [14-21] 14 (11/11 0325) BP: (93-113)/(47-80) 95/49 (11/11 0500) SpO2:  [98 %-100 %] 98 % (11/11 0325)    Intake/Output from previous day: 11/10 0701 - 11/11 0700 In: 1912.9 [P.O.:480; I.V.:1382.9; IV Piggyback:50] Out: 2 [Blood:2] Intake/Output this shift: No intake/output data recorded.  PE: Skin: left sided perirectal wound packing removed.  Wound is completely clean.  Decreasing erythema around wound.  No purulent drainage  Lab Results:  Recent Labs    10/15/20 1919 10/16/20 0316  WBC 13.4* 12.9*  HGB 11.3* 10.6*  HCT 36.3 33.9*  PLT 240 217   BMET Recent Labs    10/15/20 1919 10/16/20 0316  NA 136 137  K 3.6 3.2*  CL 106 107  CO2 20* 19*  GLUCOSE 92 86  BUN 9 7  CREATININE 0.57 0.58  CALCIUM 8.9 8.1*   PT/INR No results for input(s): LABPROT, INR in the last 72 hours. CMP     Component Value Date/Time   NA 137 10/16/2020 0316   K 3.2 (L) 10/16/2020 0316   CL 107 10/16/2020 0316   CO2 19 (L) 10/16/2020 0316   GLUCOSE 86 10/16/2020 0316   BUN 7 10/16/2020 0316   CREATININE 0.58 10/16/2020 0316   CALCIUM 8.1 (L) 10/16/2020 0316   GFRNONAA >60 10/16/2020 0316   Lipase  No results found for: LIPASE     Studies/Results: CT ABDOMEN PELVIS W CONTRAST  Result Date: 10/15/2020 CLINICAL DATA:  Rectal abscess, history of the same EXAM: CT ABDOMEN AND PELVIS WITH CONTRAST TECHNIQUE: Multidetector CT imaging of the abdomen and pelvis was performed using the standard protocol following bolus administration of intravenous contrast. CONTRAST:  OMNIPAQUE IOHEXOL 300 MG/ML  SOLN COMPARISON:  None. FINDINGS: Lower chest: Lung bases are clear.  Normal heart size. No pericardial effusion. Hepatobiliary: No worrisome focal liver lesions. Smooth liver surface contour. Normal hepatic attenuation. Normal gallbladder and biliary tree. Pancreas: No pancreatic ductal dilatation or surrounding inflammatory changes. Spleen: Normal in size. No concerning splenic lesions. Adrenals/Urinary Tract: Normal adrenal glands. Kidneys are normally located with symmetric enhancement. Tiny subcentimeter hypoattenuating focus in the upper pole right kidney too small to fully characterize on CT imaging but statistically likely benign. No suspicious renal lesion, urolithiasis or hydronephrosis. Urinary bladder is largely decompressed at the time of exam and therefore poorly evaluated by CT imaging. No gross abnormality is seen. Stomach/Bowel: Distal esophagus, stomach and duodenum are unremarkable. No small bowel thickening or dilatation. A normal appendix is visualized. No colonic dilatation or wall thickening. No evidence of obstruction. Circumferential thickening of the rectum asymmetric thickening of the left levator plate and small amount of fluid and stranding within the left intersphincteric plane. This is contiguous with findings along the left gluteal cleft/ischioanal fossa as described below. Vascular/Lymphatic: No significant vascular findings are present. Likely reactive inguinal nodes are present. Several measuring up to 1 cm short axis. Reproductive: Anteverted uterus. Normal follicles present in both ovaries including what is likely a collapsing corpus luteum in the left ovary. Other: Extensive skin thickening and subcutaneous fat stranding extending predominantly along the left gluteal cleft with a partially rim enhancing heterogeneous attenuation collection in the subcutaneous tissues  measuring approximately 6.3 x 3.4 x 5.3 cm in size extending from the gluteal cleft anteriorly towards the ischioanal fossa with surrounding phlegmonous change and extension to the  left levator plate. There is asymmetric thickening and inflammation along the left intersphincteric plane and conspicuous loss of discernible fat plane between rectum at the 3 o'clock position and adjacent levator plate. Some low-attenuation free fluid is seen in the pelvis, nonspecific. May be reactive or physiologic in a reproductive age female. No free air is evident. Some mild focal sites of dermal thickening noted along the mons pubis. Correlate with visual inspection. No bowel containing hernias. Musculoskeletal: No acute osseous abnormality or suspicious osseous lesion. Asymmetric changes of th the left levator plate, as above. No intramuscular abscess or collection is seen. IMPRESSION: 1. Extensive skin thickening and subcutaneous fat stranding extending predominantly along the left gluteal cleft with a partially rim enhancing collection in the subcutaneous tissues measuring approximately 6.3 x 3.4 x 5.3 cm in size concerning for a perirectal abscess which extends anteriorly towards the ischioanal fossa and left levator plate. Thickening and inflammation along the left intersphincteric plane and conspicuous loss of discernible fat plane between rectum at the 3 o'clock position and adjacent levator plate. Findings are concerning for a perianal abscess with possible transsphincteric extension within the limitations of CT imaging. 2. Likely reactive inguinal nodes. 3. Small volume low-attenuation free fluid in the pelvis, nonspecific, but may be reactive or physiologic in a reproductive age female. 4. Likely collapsing corpus luteum in the left ovary. Electronically Signed   By: Kreg Shropshire M.D.   On: 10/15/2020 22:37    Anti-infectives: Anti-infectives (From admission, onward)   Start     Dose/Rate Route Frequency Ordered Stop   10/17/20 1400  clindamycin (CLEOCIN) capsule 300 mg        300 mg Oral Every 8 hours 10/17/20 1003     10/16/20 0945  piperacillin-tazobactam (ZOSYN) IVPB 3.375 g         3.375 g 100 mL/hr over 30 Minutes Intravenous To Short Stay 10/16/20 0944 10/16/20 1038   10/15/20 2345  piperacillin-tazobactam (ZOSYN) IVPB 3.375 g  Status:  Discontinued        3.375 g 12.5 mL/hr over 240 Minutes Intravenous Every 8 hours 10/15/20 2332 10/17/20 1003   10/15/20 2245  clindamycin (CLEOCIN) IVPB 600 mg        600 mg 100 mL/hr over 30 Minutes Intravenous  Once 10/15/20 2241 10/16/20 0005       Assessment/Plan POD 1, s/p I&D of perirectal abscess -packing removed.  Won't repack wound -sitz bathes TID and after each BM -miralax daily to help soften stools -mobilize -plan for DC tomorrow -Switch to oral clinda   FEN - regular diet VTE - lovenox ID - DC zosyn, oral clinda, complete 5 days total   LOS: 0 days    Letha Cape , [facility] 10/17/2020, 10:08 AM Please see Amion for pager number during day hours 7:00am-4:30pm or 7:00am -11:30am on weekends

## 2020-10-18 MED ORDER — OXYCODONE HCL 5 MG PO TABS: 5.0000 mg | ORAL_TABLET | Freq: Four times a day (QID) | ORAL | 0 refills | Status: AC | PRN

## 2020-10-18 MED ORDER — ACETAMINOPHEN 325 MG PO TABS
650.0000 mg | ORAL_TABLET | Freq: Four times a day (QID) | ORAL | Status: DC | PRN
Start: 2020-10-18 — End: 2022-08-05

## 2020-10-18 MED ORDER — IBUPROFEN 200 MG PO TABS: 400.0000 mg | ORAL_TABLET | Freq: Three times a day (TID) | ORAL | Status: DC | PRN

## 2020-10-18 MED ORDER — POLYETHYLENE GLYCOL 3350 17 G PO PACK
17.0000 g | PACK | Freq: Every day | ORAL | 0 refills | Status: DC | PRN
Start: 2020-10-18 — End: 2022-08-04

## 2020-10-18 NOTE — Discharge Summary (Signed)
    Patient ID: Sarah Solis [id number] December 25, 2000 19 y.o.  Admit date: 10/15/2020 Discharge date: 10/18/2020  Admitting Diagnosis: Perirectal abscess  Discharge Diagnosis Patient Active Problem List   Diagnosis Date Noted  . Perirectal abscess 10/15/2020  s/p I&D  Consultants none  Reason for Admission: 19 year old female presents to the emergency department complaining of left perirectal swelling and pain.  She was seen in the emergency department a couple days ago and diagnosed with suspected perirectal abscess and given clindamycin.  She continues to complain of pain.  She has had a small amount of drainage from the area as well.  It is painful for her to go to the bathroom.  She underwent evaluation in the emergency department which included laboratory studies showing white blood cell count 13,400.  She underwent CT scan of the abdomen and pelvis which shows a 6 cm left perirectal abscess.  I was asked to see her regarding surgical management.  She reports a history of infected cysts in her axilla in the past as well as an infected pilonidal cyst.  Procedures Incision and drainage of perirectal abscess, Dr. Vladimir Faster Course:  The patient was admitted and started on IV zosyn.  She went to the OR and had the above procedure completed.  The following day her packing was removed.  Her wound was clean and packing not replaced.  She was started on sitz bathes.  She was transitioned to oral clindamycin.  On POD 2, her pain was better controlled and her wound remained clean.  She was otherwise stable for DC home.  Physical Exam: Skin: wound is clean with minimal drainage.  No further erythema and less induration.    Allergies as of 10/18/2020   No Known Allergies     Medication List    TAKE these medications   acetaminophen 325 MG tablet Commonly known as: TYLENOL Take 2-3 tablets (650-975 mg total) by mouth every 6 (six) hours as needed for mild pain (or temp > 100).    clindamycin 150 MG capsule Commonly known as: CLEOCIN Take 150 mg by mouth 3 (three) times daily.   ibuprofen 200 MG tablet Commonly known as: ADVIL Take 2 tablets (400 mg total) by mouth every 8 (eight) hours as needed.   oxyCODONE 5 MG immediate release tablet Commonly known as: Oxy IR/ROXICODONE Take 1 tablet (5 mg total) by mouth every 6 (six) hours as needed for up to 5 days for moderate pain.   polyethylene glycol 17 g packet Commonly known as: MIRALAX / GLYCOLAX Take 17 g by mouth daily as needed.         Follow-up Information    [facility], [place]. Call on 10/29/2020.   Specialty: General Surgery Why: at 11:30 AM for wound check.  Please arrive 30 minutes prior to your appointment to check in and fill out paperwork. Bring photo ID and insurance information. Contact information: [address] [place] [address] [phone]              Signed: Barnetta Chapel, [facility] Surgery 10/18/2020, 10:25 AM Please see Amion for pager number during day hours 7:00am-4:30pm, 7-11:30am on Weekends

## 2020-10-18 NOTE — Plan of Care (Signed)
  Problem: Education: Goal: Required Educational Video(s) Outcome: Progressing   

## 2022-01-16 IMAGING — CT CT ABD-PELV W/ CM
2 of 4 series · 13 of 46 positions shown, 15 images · IV contrast (APPLIED)
Comparison: None.

CLINICAL DATA: Rectal abscess, history of the same

EXAM:
CT ABDOMEN AND PELVIS WITH CONTRAST
TECHNIQUE: Multidetector CT imaging of the abdomen and pelvis was performed
using the standard protocol following bolus administration of
intravenous contrast.
CONTRAST:  100mL OMNIPAQUE IOHEXOL 300 MG/ML  SOLN

[Series 3: abdomen 5.0 · axial · 0.66mm/px · z∈[-594,-159]mm · 10 of 97 slices shown, 12 images]
[im 5/97  soft-tissue]
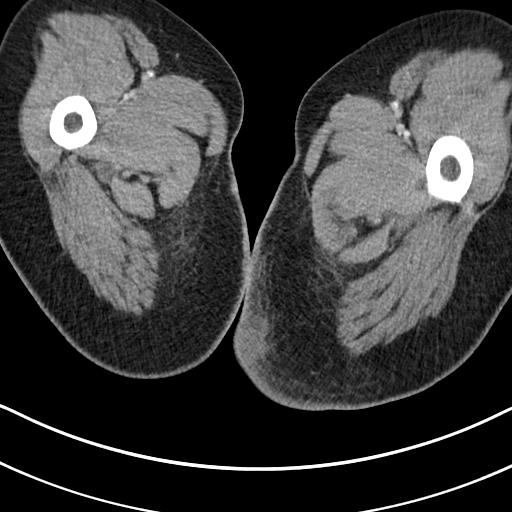
[im 5/97  bone]
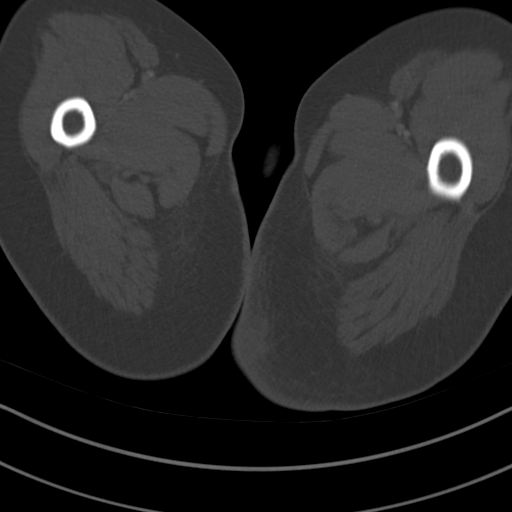
[im 15/97  soft-tissue]
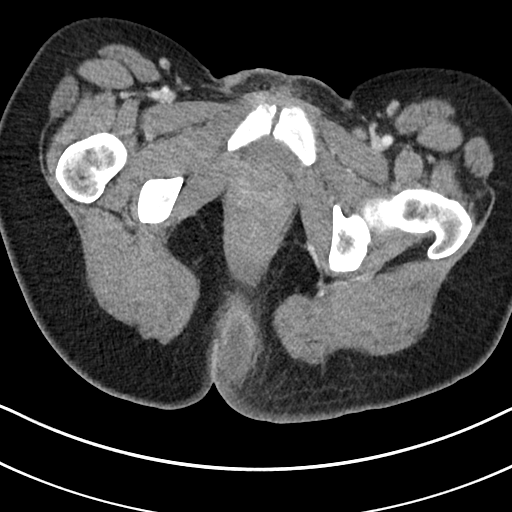
[im 25/97  soft-tissue]
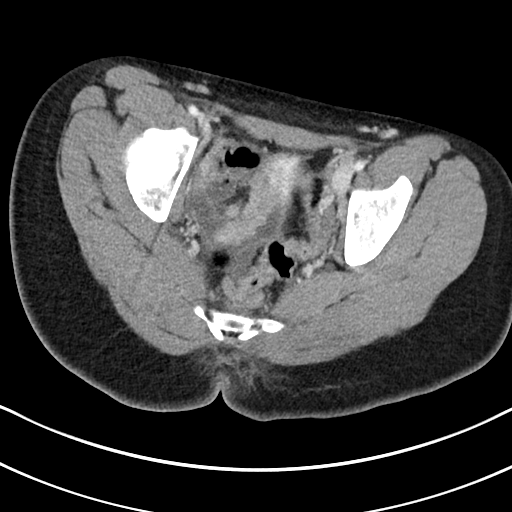
[im 34/97  soft-tissue]
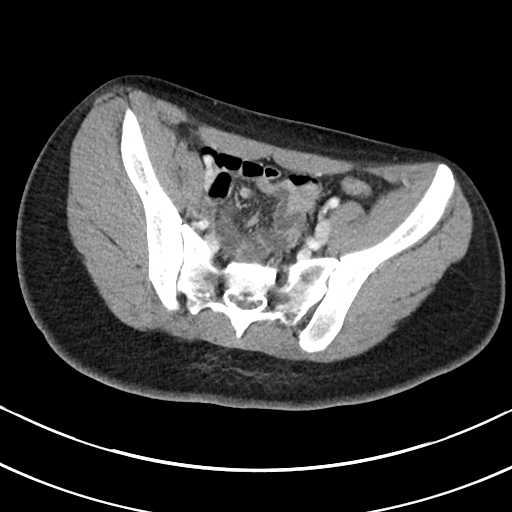
[im 44/97  soft-tissue]
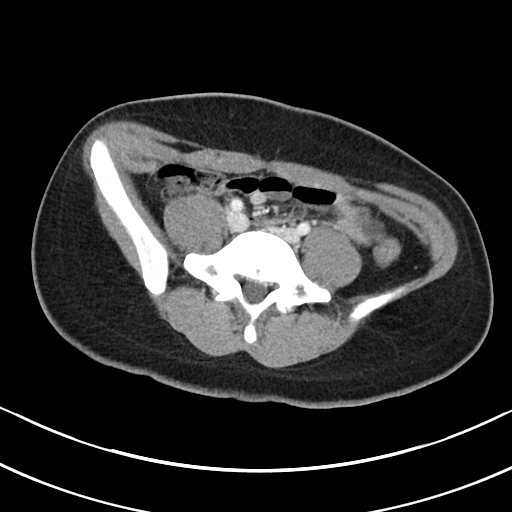
[im 53/97  soft-tissue]
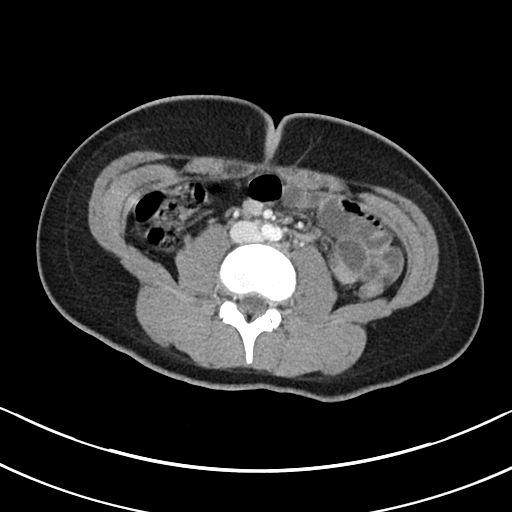
[im 63/97  soft-tissue]
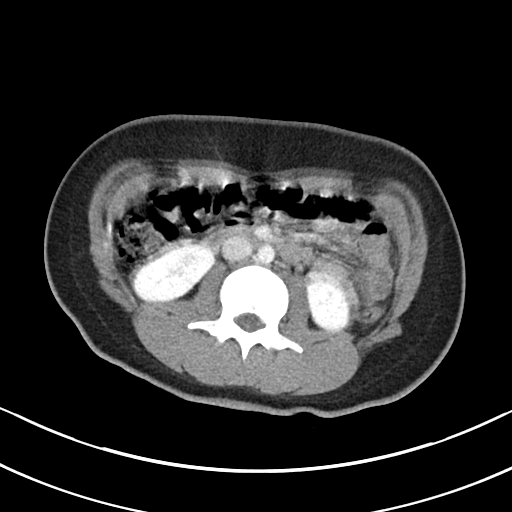
[im 73/97  soft-tissue]
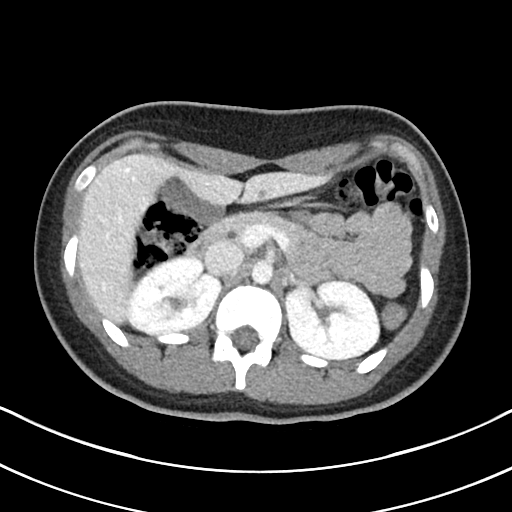
[im 82/97  soft-tissue]
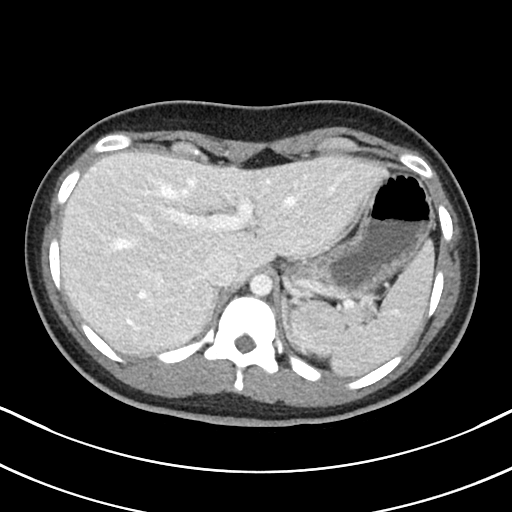
[im 82/97  bone]
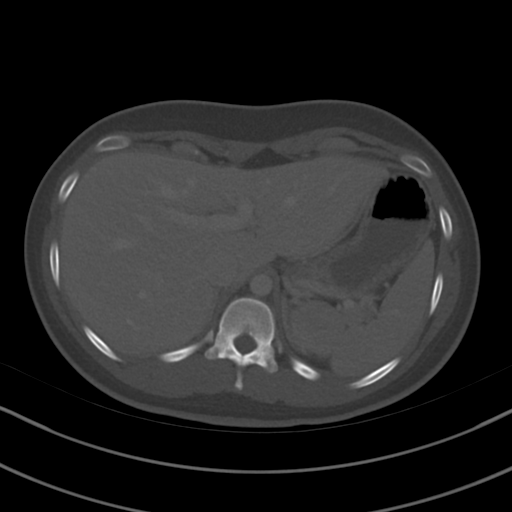
[im 92/97  soft-tissue]
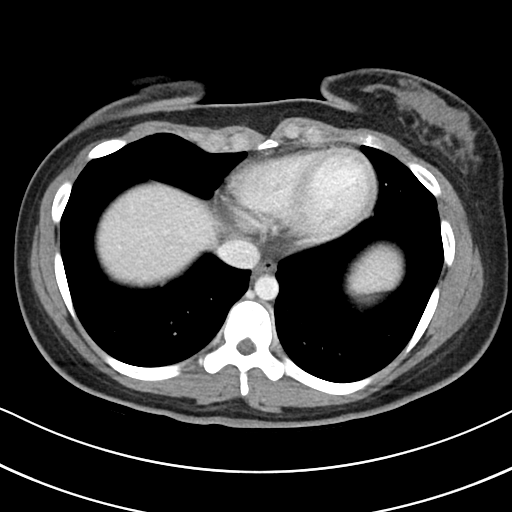

[Series 6: abdomen 3.0 mpr cor · coronal · 0.65mm/px · 3 of 82 slices shown]
[im 28/82  soft-tissue]
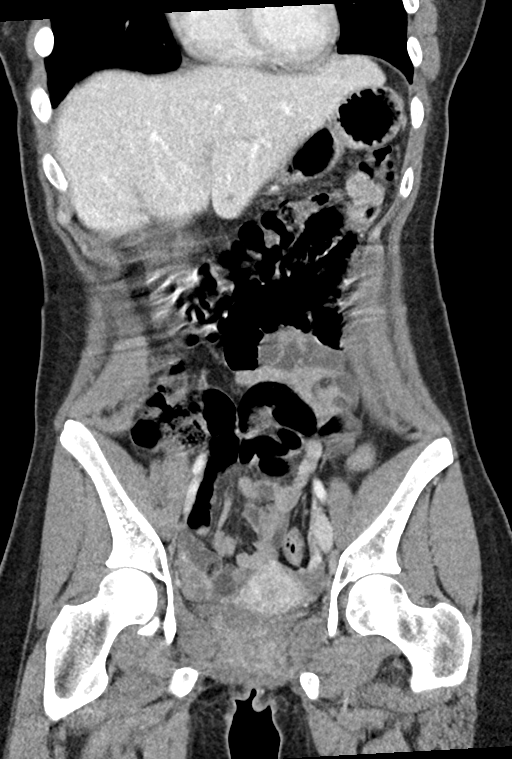
[im 37/82  soft-tissue]
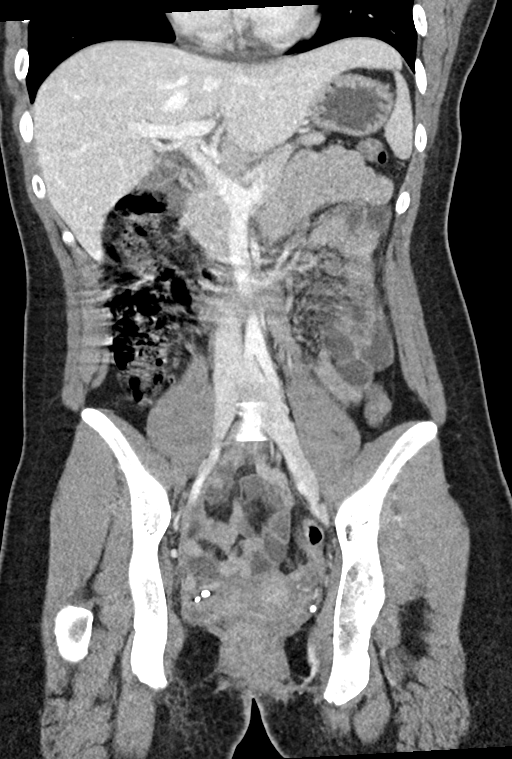
[im 46/82  soft-tissue]
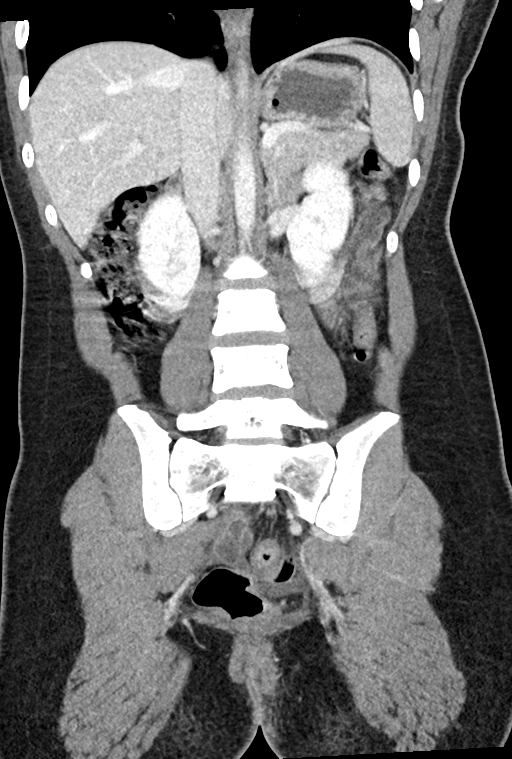

[13 of 46 positions shown; findings below may reference images not displayed]

FINDINGS: Lower chest: Lung bases are clear. Normal heart size. No pericardial
effusion.

Hepatobiliary: No worrisome focal liver lesions. Smooth liver
surface contour. Normal hepatic attenuation. Normal gallbladder and
biliary tree.

Pancreas: No pancreatic ductal dilatation or surrounding
inflammatory changes.

Spleen: Normal in size. No concerning splenic lesions.

Adrenals/Urinary Tract: Normal adrenal glands. Kidneys are normally
located with symmetric enhancement. Tiny subcentimeter
hypoattenuating focus in the upper pole right kidney too small to
fully characterize on CT imaging but statistically likely benign. No
suspicious renal lesion, urolithiasis or hydronephrosis. Urinary
bladder is largely decompressed at the time of exam and therefore
poorly evaluated by CT imaging. No gross abnormality is seen.

Stomach/Bowel: Distal esophagus, stomach and duodenum are
unremarkable. No small bowel thickening or dilatation. A normal
appendix is visualized. No colonic dilatation or wall thickening. No
evidence of obstruction. Circumferential thickening of the rectum
asymmetric thickening of the left levator plate and small amount of
fluid and stranding within the left intersphincteric plane. This is
contiguous with findings along the left gluteal cleft/ischioanal
fossa as described below.

Vascular/Lymphatic: No significant vascular findings are present.
Likely reactive inguinal nodes are present. Several measuring up to
1 cm short axis.

Reproductive: Anteverted uterus. Normal follicles present in both
ovaries including what is likely a collapsing corpus luteum in the
left ovary.

Other: Extensive skin thickening and subcutaneous fat stranding
extending predominantly along the left gluteal cleft with a
partially rim enhancing heterogeneous attenuation collection in the
subcutaneous tissues measuring approximately 6.3 x 3.4 x 5.3 cm in
size extending from the gluteal cleft anteriorly towards the
ischioanal fossa with surrounding phlegmonous change and extension
to the left levator plate. There is asymmetric thickening and
inflammation along the left intersphincteric plane and conspicuous
loss of discernible fat plane between rectum at the 3 o'clock
position and adjacent levator plate.

Some low-attenuation free fluid is seen in the pelvis, nonspecific.
May be reactive or physiologic in a reproductive age female. No free
air is evident. Some mild focal sites of dermal thickening noted
along the mons pubis. Correlate with visual inspection.

No bowel containing hernias.

Musculoskeletal: No acute osseous abnormality or suspicious osseous
lesion. Asymmetric changes of th the left levator plate, as above.
No intramuscular abscess or collection is seen.
IMPRESSION: 1. Extensive skin thickening and subcutaneous fat stranding
extending predominantly along the left gluteal cleft with a
partially rim enhancing collection in the subcutaneous tissues
measuring approximately 6.3 x 3.4 x 5.3 cm in size concerning for a
perirectal abscess which extends anteriorly towards the ischioanal
fossa and left levator plate. Thickening and inflammation along the
left intersphincteric plane and conspicuous loss of discernible fat
plane between rectum at the 3 o'clock position and adjacent levator
plate. Findings are concerning for a perianal abscess with possible
transsphincteric extension within the limitations of CT imaging.
2. Likely reactive inguinal nodes.
3. Small volume low-attenuation free fluid in the pelvis,
nonspecific, but may be reactive or physiologic in a reproductive
age female.
4. Likely collapsing corpus luteum in the left ovary.

## 2022-05-15 ENCOUNTER — Inpatient Hospital Stay (HOSPITAL_BASED_OUTPATIENT_CLINIC_OR_DEPARTMENT_OTHER): Admission: RE | Admit: 2022-05-15 | Payer: Medicaid Other | Source: Ambulatory Visit | Admitting: Radiology

## 2022-05-15 DIAGNOSIS — Z1231 Encounter for screening mammogram for malignant neoplasm of breast: Secondary | ICD-10-CM

## 2022-08-04 ENCOUNTER — Ambulatory Visit (HOSPITAL_COMMUNITY)
Admission: EM | Admit: 2022-08-04 | Discharge: 2022-08-05 | Disposition: A | Discharge status: Other Acute Inpatient Hospital | Payer: No Payment, Other | Attending: Psychiatry | Admitting: Psychiatry

## 2022-08-04 DIAGNOSIS — F32A Depression, unspecified: Secondary | ICD-10-CM | POA: Diagnosis not present

## 2022-08-04 DIAGNOSIS — F29 Unspecified psychosis not due to a substance or known physiological condition: Secondary | ICD-10-CM | POA: Insufficient documentation

## 2022-08-04 DIAGNOSIS — F2081 Schizophreniform disorder: Secondary | ICD-10-CM

## 2022-08-04 DIAGNOSIS — R4589 Other symptoms and signs involving emotional state: Secondary | ICD-10-CM

## 2022-08-04 DIAGNOSIS — R45851 Suicidal ideations: Secondary | ICD-10-CM | POA: Diagnosis not present

## 2022-08-04 DIAGNOSIS — Z20822 Contact with and (suspected) exposure to covid-19: Secondary | ICD-10-CM | POA: Insufficient documentation

## 2022-08-04 DIAGNOSIS — F69 Unspecified disorder of adult personality and behavior: Secondary | ICD-10-CM

## 2022-08-04 DIAGNOSIS — F22 Delusional disorders: Secondary | ICD-10-CM

## 2022-08-04 DIAGNOSIS — Z79899 Other long term (current) drug therapy: Secondary | ICD-10-CM | POA: Insufficient documentation

## 2022-08-04 LAB — CBC WITH DIFFERENTIAL/PLATELET
Abs Immature Granulocytes: 0.02 10*3/uL (ref 0.00–0.07)
Basophils Absolute: 0.1 10*3/uL (ref 0.0–0.1)
Basophils Relative: 1 %
Eosinophils Absolute: 0.1 10*3/uL (ref 0.0–0.5)
Eosinophils Relative: 1 %
HCT: 37.8 % (ref 36.0–46.0)
Hemoglobin: 12.6 g/dL (ref 12.0–15.0)
Immature Granulocytes: 0 %
Lymphocytes Relative: 21 %
Lymphs Abs: 1.6 10*3/uL (ref 0.7–4.0)
MCH: 27.6 pg (ref 26.0–34.0)
MCHC: 33.3 g/dL (ref 30.0–36.0)
MCV: 82.9 fL (ref 80.0–100.0)
Monocytes Absolute: 0.4 10*3/uL (ref 0.1–1.0)
Monocytes Relative: 5 %
Neutro Abs: 5.6 10*3/uL (ref 1.7–7.7)
Neutrophils Relative %: 72 %
Platelets: 276 10*3/uL (ref 150–400)
RBC: 4.56 MIL/uL (ref 3.87–5.11)
RDW: 12.6 % (ref 11.5–15.5)
WBC: 7.8 10*3/uL (ref 4.0–10.5)
nRBC: 0 % (ref 0.0–0.2)

## 2022-08-04 LAB — POCT URINE DRUG SCREEN - MANUAL ENTRY (I-SCREEN)
POC Amphetamine UR: NOT DETECTED
POC Buprenorphine (BUP): NOT DETECTED
POC Cocaine UR: NOT DETECTED
POC Marijuana UR: NOT DETECTED
POC Methadone UR: NOT DETECTED
POC Methamphetamine UR: NOT DETECTED
POC Morphine: NOT DETECTED
POC Oxazepam (BZO): NOT DETECTED
POC Oxycodone UR: NOT DETECTED
POC Secobarbital (BAR): NOT DETECTED

## 2022-08-04 LAB — COMPREHENSIVE METABOLIC PANEL
ALT: 10 U/L (ref 0–44)
AST: 13 U/L — ABNORMAL LOW (ref 15–41)
Albumin: 4.1 g/dL (ref 3.5–5.0)
Alkaline Phosphatase: 40 U/L (ref 38–126)
Anion gap: 8 (ref 5–15)
BUN: 12 mg/dL (ref 6–20)
CO2: 26 mmol/L (ref 22–32)
Calcium: 9.5 mg/dL (ref 8.9–10.3)
Chloride: 105 mmol/L (ref 98–111)
Creatinine, Ser: 0.65 mg/dL (ref 0.44–1.00)
GFR, Estimated: >60 mL/min (ref >60)
Glucose, Bld: 128 mg/dL — ABNORMAL HIGH (ref 70–99)
Potassium: 3.3 mmol/L — ABNORMAL LOW (ref 3.5–5.1)
Sodium: 139 mmol/L (ref 135–145)
Total Bilirubin: 0.5 mg/dL (ref 0.3–1.2)
Total Protein: 7.4 g/dL (ref 6.5–8.1)

## 2022-08-04 LAB — RESP PANEL BY RT-PCR (FLU A&B, COVID) ARPGX2
Influenza A by PCR: NEGATIVE
Influenza B by PCR: NEGATIVE
SARS Coronavirus 2 by RT PCR: NEGATIVE

## 2022-08-04 LAB — POCT PREGNANCY, URINE: Preg Test, Ur: NEGATIVE

## 2022-08-04 LAB — LIPID PANEL
Cholesterol: 144 mg/dL (ref 0–200)
HDL: 35 mg/dL — ABNORMAL LOW (ref >40)
LDL Cholesterol: 103 mg/dL — ABNORMAL HIGH (ref 0–99)
Total CHOL/HDL Ratio: 4.1 RATIO
Triglycerides: 30 mg/dL (ref <150)
VLDL: 6 mg/dL (ref 0–40)

## 2022-08-04 LAB — ETHANOL: Alcohol, Ethyl (B): <10 mg/dL (ref <10)

## 2022-08-04 LAB — HEMOGLOBIN A1C
Hgb A1c MFr Bld: 5.1 % (ref 4.8–5.6)
Mean Plasma Glucose: 99.67 mg/dL

## 2022-08-04 LAB — TSH: TSH: 1.11 u[IU]/mL (ref 0.350–4.500)

## 2022-08-04 LAB — POC SARS CORONAVIRUS 2 AG: SARSCOV2ONAVIRUS 2 AG: NEGATIVE

## 2022-08-04 MED ORDER — ALUM & MAG HYDROXIDE-SIMETH 200-200-20 MG/5ML PO SUSP: 30.0000 mL | ORAL | Status: DC | PRN

## 2022-08-04 MED ORDER — RISPERIDONE 0.5 MG PO TABS
0.5000 mg | ORAL_TABLET | Freq: Two times a day (BID) | ORAL | Status: DC
  Administered 2022-08-04 – 2022-08-05 (×3): 0.5 mg via ORAL
  Filled 2022-08-04 (×3): qty 1

## 2022-08-04 MED ORDER — ACETAMINOPHEN 325 MG PO TABS: 650.0000 mg | ORAL_TABLET | Freq: Four times a day (QID) | ORAL | Status: DC | PRN

## 2022-08-04 MED ORDER — MAGNESIUM HYDROXIDE 400 MG/5ML PO SUSP: 30.0000 mL | Freq: Every day | ORAL | Status: DC | PRN

## 2022-08-04 NOTE — ED Notes (Signed)
Dash service called for lab pick up to [facility].

## 2022-08-04 NOTE — BH Assessment (Signed)
Comprehensive Clinical Assessment (CCA) Note  08/04/2022 Sarah Solis [id number]  DISPOSITION: Gave clinical report to [name], NP who recommended Pt be admitted for continuous assessment. Pt meets criteria for inpatient psychiatric treatment.  The patient demonstrates the following risk factors for suicide: Chronic risk factors for suicide include: N/A. Acute risk factors for suicide include: N/A. Protective factors for this patient include: positive social support and responsibility to others (children, family). Considering these factors, the overall suicide risk at this point appears to be low. Patient is not appropriate for outpatient follow up due to psychotic symptoms.   Flowsheet Row ED from 08/04/2022 in [facility]  C-SSRS RISK CATEGORY No Risk      Pt is a 21 year old single female who presents to [facility] via Patent examiner with altered mental status. Pt was initially voluntary but her mother, [name] [phone], went to the magistrate and petitioned for involuntary commitment. Affidavit and petition states: "[name] is hearing voices and seeing things that are not there. [name] has stopped eating for days. [name] states that something is after her. [name] went outside and sat in the rain, the walking back and forth checking windows like someone was after her. [name] acted in a way that created a substantial risk of serious bodily harm and there is a reasonable probability that this conduct will be repeated."  Pt is a poor historian and is slow to respond to questions. She cannot explain why she came to [facility]. She repeatedly states, "I overthink things." She describes her mood as "down" and comments that she feels "empty inside." She cannot answer simple questions, replying "I don't know" to almost everything. She says she is living from place to place. When asked if she has children, she replies "Anyone around  is my child. I love caring for people."  Pt's mother and mother's husband arrived after assessment. They report Pt's behavior began to change several months ago and her functioning has declined. Pt has never had these symptoms before and has no history of mental health problems. Mother reports Pt has not been sleeping and tonight woke up everyone in the house because she is fearful someone is coming after her. She said she could hear voices and noises. Mother says Pt's behavior has been frightening her younger siblings. She went outside after midnight and was sitting in the rain. She then began running through people's yards, saying someone was after her. Mother and stepfather report Pt has not been eating has has lost at least 25 pounds. They report she used to enjoy dressing well and now is not caring for her hygiene and grooming. Mother states Pt worked two jobs and now does not work and isolates herself. She says she cannot think of any stressors Pt is experiencing or any history of trauma. She says Pt has not made any suicidal statement and engaged in intentional self-injury. She has not been physically aggressive. Mother does not believe Pt is currently using drugs but suspects Pt may have used marijuana laced with something that started this change in behavior. Mother reports Pt's biological father has been diagnosed with schizophrenia.   Pt is casually dressed and has a hoodie over her head. She is alert and oriented to person and place but not time or situation. Pt speaks in a soft tone, at low volume and slow pace. Motor behavior appears normal. Eye contact is fair. Pt's mood is dysthymic and affect is constricted. Thought process is  tangential. At times, Pt appears preoccupied. Insight is poor and judgment is impaired. She is generally cooperative.    Chief Complaint: No chief complaint on file.  Visit Diagnosis: F33.3 Major depressive disorder, Recurrent episode, With psychotic features   CCA  Screening, Triage and Referral (STR)  Patient Reported Information How did you hear about [place]? Legal System  What Is the Reason for Your Visit/Call Today? Pt presents to [facility] voluntarily, accompanied by [employer]. Pt reports calling [employer] tonight because she was overthinking and her mind wanders. Pt reports being homeless and stated that she stays wherever she can lay her head. Pt was not able to give much information pertaining to why she called to be brought in tonight. Pt is very soft spoken and lacked eye contact at times. Per [employer] officer, pt's mother is on her way to magistrate's office to take out IVC paperwork. Pt denies SI, HI, AVH and alcohol/substance use.  How Long Has This Been Causing You Problems? <Week  What Do You Feel Would Help You the Most Today? Treatment for Depression or other mood problem   Have You Recently Had Any Thoughts About Hurting Yourself? No  Are You Planning to Commit Suicide/Harm Yourself At This time? No   Have you Recently Had Thoughts About Hurting Someone [name]? No  Are You Planning to Harm Someone at This Time? No  Explanation: No data recorded  Have You Used Any Alcohol or Drugs in the Past 24 Hours? No  How Long Ago Did You Use Drugs or Alcohol? No data recorded What Did You Use and How Much? No data recorded  Do You Currently Have a Therapist/Psychiatrist? No  Name of Therapist/Psychiatrist: No data recorded  Have You Been Recently Discharged From Any Office Practice or Programs? No  Explanation of Discharge From Practice/Program: No data recorded    CCA Screening Triage Referral Assessment Type of Contact: Face-to-Face  Telemedicine Service Delivery:   Is this Initial or Reassessment? No data recorded Date Telepsych consult ordered in [facility]:  No data recorded Time Telepsych consult ordered in [facility]:  No data recorded Location of Assessment: [facility] Assessment Services  Provider Location: [facility] Assessment Services   Collateral Involvement:  Pt's mother: [name] [phone]   Does Patient Have a Court Appointed Legal Guardian? No data recorded Name and Contact of Legal Guardian: No data recorded If Minor and Not Living with Parent(s), Who has Custody? NA  Is CPS involved or ever been involved? Never  Is APS involved or ever been involved? Never   Patient Determined To Be At Risk for Harm To Self or Others Based on Review of Patient Reported Information or Presenting Complaint? Yes, for Self-Harm  Method: No data recorded Availability of Means: No data recorded Intent: No data recorded Notification Required: No data recorded Additional Information for Danger to Others Potential: No data recorded Additional Comments for Danger to Others Potential: No data recorded Are There Guns or Other Weapons in Your Home? No data recorded Types of Guns/Weapons: No data recorded Are These Weapons Safely Secured?                            No data recorded Who Could Verify You Are Able To Have These Secured: No data recorded Do You Have any Outstanding Charges, Pending Court Dates, Parole/Probation? No data recorded Contacted To Inform of Risk of Harm To Self or Others: Family/Significant Other:    Does Patient Present  under Involuntary Commitment? Yes  IVC Papers Initial File Date: 08/04/22   [place] of Residence: [place]   Patient Currently Receiving the Following Services: Not Receiving Services   Determination of Need: Emergent (2 hours)   Options For Referral: Inpatient Hospitalization     CCA Biopsychosocial Patient Reported Schizophrenia/Schizoaffective Diagnosis in Past: No   Strengths: Pt has family support   Mental Health Symptoms Depression:   Change in energy/activity; Difficulty Concentrating; Fatigue; Increase/decrease in appetite; Sleep (too much or little)   Duration of Depressive symptoms:  Duration of Depressive Symptoms: Greater than two weeks   Mania:   None   Anxiety:     Worrying; Tension; Sleep; Restlessness; Fatigue; Difficulty concentrating   Psychosis:   Hallucinations   Duration of Psychotic symptoms:  Duration of Psychotic Symptoms: Less than six months   Trauma:   None   Obsessions:   None   Compulsions:   None   Inattention:   N/A   Hyperactivity/Impulsivity:   N/A   Oppositional/Defiant Behaviors:   N/A   Emotional Irregularity:   None   Other Mood/Personality Symptoms:   NA    Mental Status Exam Appearance and self-care  Stature:   Average   Weight:   Average weight   Clothing:   Casual   Grooming:   Normal   Cosmetic use:   Age appropriate   Posture/gait:   Normal   Motor activity:   Not Remarkable   Sensorium  Attention:   Inattentive   Concentration:   Preoccupied   Orientation:   Person; Place   Recall/memory:   Defective in Recent   Affect and Mood  Affect:   Constricted   Mood:   Dysphoric   Relating  Eye contact:   Fleeting   Facial expression:   Constricted   Attitude toward examiner:   Passive   Thought and Language  Speech flow:  Paucity; Soft   Thought content:   Appropriate to Mood and Circumstances   Preoccupation:   None   Hallucinations:   Auditory   Organization:  No data recorded  [employer] of Knowledge:   Average   Intelligence:   Average   Abstraction:   Functional   Judgement:   Poor   Reality Testing:   Distorted   Insight:   Poor   Decision Making:   Impulsive   Social Functioning  Social Maturity:   Isolates   Social Judgement:   Normal   Stress  Stressors:   Other (Comment) (None identified)   Coping Ability:   Overwhelmed   Skill Deficits:   None   Supports:   Family     Religion: Religion/Spirituality Are You A Religious Person?:  (Pt says "I don't know.")  Leisure/Recreation: Leisure / Recreation Do You Have Hobbies?:  (Pt says "I don't know.")  Exercise/Diet: Exercise/Diet Do  You Exercise?:  (Pt says "I don't know.") Have You Gained or Lost A Significant Amount of Weight in the Past Six Months?: Yes-Lost Number of Pounds Lost?: 25 Do You Follow a Special Diet?: No Do You Have Any Trouble Sleeping?: Yes Explanation of Sleeping Difficulties: Mother states Pt is not sleeping   CCA Employment/Education Employment/Work Situation: Employment / Work Situation Employment Situation: Unemployed Patient's Job has Been Impacted by Current Illness: Yes Describe how Patient's Job has Been Impacted: Pt has been unable to work due to mental health symptoms. Has Patient ever Been in the [employer]?: No  Education: Education Is Patient Currently Attending School?:  No Last Grade Completed:  (Pt says "I don't know.")   CCA Family/Childhood History Family and Relationship History: Family history Marital status: Single Does patient have children?: No  Childhood History:  Childhood History By whom was/is the patient raised?: Mother Did patient suffer any verbal/emotional/physical/sexual abuse as a child?:  (Pt says "I don't know.") Did patient suffer from severe childhood neglect?:  (Pt says "I don't know.") Has patient ever been sexually abused/assaulted/raped as an adolescent or adult?:  (Pt says "I don't know.") Was the patient ever a victim of a crime or a disaster?:  (Pt says "I don't know.") Witnessed domestic violence?:  (Pt says "I don't know.") Has patient been affected by domestic violence as an adult?:  (Pt says "I don't know.")  Child/Adolescent Assessment:     CCA Substance Use Alcohol/Drug Use: Alcohol / Drug Use Pain Medications: Denies use Prescriptions: Denies use Over the Counter: Denies use History of alcohol / drug use?: No history of alcohol / drug abuse Longest period of sobriety (when/how long): NA                         ASAM's:  Six Dimensions of Multidimensional Assessment  Dimension 1:  Acute Intoxication and/or  Withdrawal Potential:      Dimension 2:  Biomedical Conditions and Complications:      Dimension 3:  Emotional, Behavioral, or Cognitive Conditions and Complications:     Dimension 4:  Readiness to Change:     Dimension 5:  Relapse, Continued use, or Continued Problem Potential:     Dimension 6:  Recovery/Living Environment:     ASAM Severity Score:    ASAM Recommended Level of Treatment:     Substance use Disorder (SUD)    Recommendations for Services/Supports/Treatments:    Discharge Disposition:    DSM5 Diagnoses: Patient Active Problem List   Diagnosis Date Noted   Perirectal abscess 10/15/2020     Referrals to Alternative Service(s): Referred to Alternative Service(s):   Place:   Date:   Time:    Referred to Alternative Service(s):   Place:   Date:   Time:    Referred to Alternative Service(s):   Place:   Date:   Time:    Referred to Alternative Service(s):   Place:   Date:   Time:     Pamalee Leyden, [facility]

## 2022-08-04 NOTE — ED Notes (Signed)
Pt sleeping@this time. Breathing even and unlabored will continue to monitor for safety 

## 2022-08-04 NOTE — ED Notes (Signed)
Patient admitted to [facility] endorsing SI. Denies HI, AVH.  Patient was slow to cooperate during the admission assessment. Skin assessment complete. Belongings inventoried. Patient oriented to unit and unit rules. Meal and drinks offered to patient. Patient verbalized agreement to treatment plans. Patient verbally contracts for safety during hospitalization. Will continue to monitor for safety.

## 2022-08-04 NOTE — ED Notes (Signed)
Patient resting quietly in bed with eyes closed. Respirations equal and unlabored, skin warm and dry, NAD. No change in assessment or acuity. Routine safety checks conducted according to facility protocol. Will continue to monitor for safety.   

## 2022-08-04 NOTE — ED Notes (Signed)
Patient refused EKG.

## 2022-08-04 NOTE — ED Notes (Signed)
Patient A&Ox4. Patient denies SI/HI and AVH. Patient denies any physical complaints when asked. No acute distress noted. Support and encouragement provided. Routine safety checks conducted according to facility protocol. Encouraged patient to notify staff if thoughts of harm toward self or others arise. Patient verbalize understanding and agreement. Will continue to monitor for safety.    

## 2022-08-04 NOTE — ED Triage Notes (Signed)
Pt presents to [facility] voluntarily, accompanied by [employer]. Pt reports calling [employer] tonight because she was overthinking and her mind wanders. Pt reports being homeless and stated that she stays wherever she can lay her head. Pt was not able to give much information pertaining to why she called to be brought in tonight. Pt is very soft spoken and lacked eye contact at times. Per [employer] officer, pt's mother is on her way to magistrate's office to take out IVC paperwork. Pt denies SI, HI, AVH and alcohol/substance use.

## 2022-08-04 NOTE — ED Provider Notes (Signed)
[facility] Continuous Assessment Admission H&P  Date: 08/04/22 Patient Name: Sarah Solis MRN: [id number] Chief Complaint: No chief complaint on file.     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  Patient endorsed passive suicide ideation, stating she is looking for help she thinks she is overstressed.  Per the patient I feel empty inside.  Patient denies having a psychiatrist or therapist.  Per the patient she lives all over, patient seems to be homeless at this point.  Patient states she is originally from the [place] area.  Patient seems not to be forthcoming with information saying I think over thinking things.  TTS report, Pt's mother and mother's husband arrived after assessment. They report Pt's behavior began to change several months ago and her functioning has declined. Pt has never had these symptoms before and has no history of mental health problems. Mother reports Pt has not been sleeping and tonight woke up everyone in the house because she is fearful someone is coming after her. She said she could hear voices and noises. Mother says Pt's behavior has been frightening her younger siblings. She went outside after midnight and was sitting in the rain. She then began running through people's yards, saying someone was after her. Mother and stepfather report Pt has not been eating has has lost at least 25 pounds. They report she used to enjoy dressing well and now is not caring for her hygiene and grooming. Mother states Pt worked two jobs and now does not work and isolates herself. She says she cannot think of any stressors Pt is experiencing or any history of trauma. She says Pt has not made any suicidal statement and engaged in intentional self-injury. She has not been physically aggressive.  Mother does not believe Pt is currently using drugs but suspects Pt may have used marijuana laced with something that started this change in behavior. Mother reports Pt's biological father has been diagnosed with schizophrenia.     Collaborate: patient mother later on petitioned for IVC,  according to mothers pt is not homeless,  however she has been acting more psychotic.  Pt has often time would sit in the rain for no reason,  pt was working two job but not working now.  Pt tonight was running into other people yard and acting strange. According to pt mother pt has been closing and checking windows at home and acting suspicious of others.   Face-to-face observation of patient, patient is alert and oriented, speech is clear but very soft spoken, patient maintained minimal eye contact.  Mood is depressed affect is flat congruent with mood.  Patient report suicidal thoughts but denies any plan, patient denies HI, AVH or paranoia.  Patient denies smoking or drug use.  However patient is very withdrawn and keeps saying I do not know when asked a question.  PHQ 2-9:   [place] from 08/04/2022 in [place] No Risk        Total Time spent with patient: 20 minutes  Musculoskeletal  Strength & Muscle Tone: within normal limits Gait & Station: normal Patient leans: N/A  Psychiatric Specialty Exam  Presentation General Appearance: Casual  Eye Contact:Fair  Speech:Blocked  Speech Volume:Decreased  Handedness:Ambidextrous   Mood and Affect  Mood:Euthymic  Affect:Constricted  Thought Process  Thought Processes:Linear  Descriptions of Associations:Tangential  Orientation:Full (Time, Place and Person)  Thought Content:Scattered    Hallucinations:Hallucinations: None  Ideas of Reference:None  Suicidal Thoughts:Suicidal Thoughts: Yes, Passive SI Passive Intent and/or Plan: Without Intent  Homicidal Thoughts:Homicidal  Thoughts: No   Sensorium  Memory:Immediate Fair  Judgment:Fair  Insight:Fair   Executive Functions  Concentration:Fair  Attention Span:Fair  [place]   Psychomotor Activity  Psychomotor Activity:Psychomotor Activity: Normal   Assets  Assets:Desire for Improvement; Housing   Sleep  Sleep:Sleep: Fair   Nutritional Assessment (For OBS and FBC admissions only) Has the patient had a weight loss or gain of 10 pounds or more in the last 3 months?: No Has the patient had a decrease in food intake/or appetite?: No Does the patient have dental problems?: No Does the patient have eating habits or behaviors that may be indicators of an eating disorder including binging or inducing vomiting?: No Has the patient recently lost weight without trying?: 0 Has the patient been eating poorly because of a decreased appetite?: 0 Malnutrition Screening Tool Score: 0    Physical Exam HENT:     Head: Normocephalic.     Nose: Nose normal.  Cardiovascular:     Rate and Rhythm: Normal rate.  Pulmonary:     Effort: Pulmonary effort is normal.  Musculoskeletal:        General: Normal range of motion.     Cervical back: Normal range of motion.  Neurological:     General: No focal deficit present.     Mental Status: She is alert.  Psychiatric:        Mood and Affect: Mood normal.        Behavior: Behavior normal.        Thought Content: Thought content normal.        Judgment: Judgment normal.    Review of Systems  Constitutional: Negative.   HENT: Negative.    Eyes: Negative.   Respiratory: Negative.    Cardiovascular: Negative.   Gastrointestinal: Negative.   Genitourinary: Negative.   Musculoskeletal: Negative.   Skin: Negative.   Neurological: Negative.   Endo/Heme/Allergies: Negative.   Psychiatric/Behavioral:  The patient is nervous/anxious.     Blood pressure 111/82, pulse 77, temperature 98.2 F (36.8 C), temperature  source Oral, resp. rate 18, SpO2 100 %. There is no height or weight on file to calculate BMI.  Past Psychiatric History: none   Is the patient at risk to self? Yes  Has the patient been a risk to self in the past 6 months? No .    Has the patient been a risk to self within the distant past? No   Is the patient a risk to others? No   Has the patient been a risk to others in the past 6 months? No   Has the patient been a risk to others within the distant past? No   Past Medical History: No past medical history on file.  Past Surgical History:  Procedure Laterality Date   INCISION AND DRAINAGE PERIRECTAL ABSCESS Left 10/16/2020   Procedure: IRRIGATION AND DEBRIDEMENT PERIRECTAL ABSCESS;  Surgeon: Coralie Keens, MD;  Location: [facility];  Service: General;  Laterality: Left;    Family History: No family history on file.  Social History:  Social History   Socioeconomic History   Marital status: Single    Spouse name: Not on file   Number of children: Not on file   Years  of education: Not on file   Highest education level: Not on file  Occupational History   Not on file  Tobacco Use   Smoking status: Never   Smokeless tobacco: Never  Vaping Use   Vaping Use: Never used  Substance and Sexual Activity   Alcohol use: Not Currently   Drug use: Never   Sexual activity: Not Currently  Other Topics Concern   Not on file  Social History Narrative   Not on file   Social Determinants of Health   Financial Resource Strain: Not on file  Food Insecurity: Not on file  Transportation Needs: Not on file  Physical Activity: Not on file  Stress: Not on file  Social Connections: Not on file  Intimate Partner Violence: Not on file    SDOH:  SDOH Screenings   Alcohol Screen: Not on file  Depression (NAT5-5): Not on file  Financial Resource Strain: Not on file  Food Insecurity: Not on file  Housing: Not on file  Physical Activity: Not on file  Social Connections: Not on file   Stress: Not on file  Tobacco Use: Low Risk  (12/21/2020)   Patient History    Smoking Tobacco Use: Never    Smokeless Tobacco Use: Never    Passive Exposure: Not on file  Transportation Needs: Not on file    Last Labs:  No visits with results within 6 Month(s) from this visit.  Latest known visit with results is:  Admission on 10/15/2020, Discharged on 10/18/2020  Component Date Value Ref Range Status   Sodium 10/15/2020 136  135 - 145 mmol/L Final   Potassium 10/15/2020 3.6  3.5 - 5.1 mmol/L Final   Chloride 10/15/2020 106  98 - 111 mmol/L Final   CO2 10/15/2020 20 (L)  22 - 32 mmol/L Final   Glucose, Bld 10/15/2020 92  70 - 99 mg/dL Final   Glucose reference range applies only to samples taken after fasting for at least 8 hours.   BUN 10/15/2020 9  6 - 20 mg/dL Final   Creatinine, Ser 10/15/2020 0.57  0.44 - 1.00 mg/dL Final   Calcium 73/22/0254 8.9  8.9 - 10.3 mg/dL Final   GFR, Estimated 10/15/2020 >60  >60 mL/min Final   Comment: (NOTE) Calculated using the CKD-EPI Creatinine Equation (2021)    Anion gap 10/15/2020 10  5 - 15 Final   Performed at [facility], [address], [place], [place] [address]   WBC 10/15/2020 13.4 (H)  4.0 - 10.5 K/uL Final   RBC 10/15/2020 4.20  3.87 - 5.11 MIL/uL Final   Hemoglobin 10/15/2020 11.3 (L)  12.0 - 15.0 g/dL Final   HCT 37/62/8315 36.3  36.0 - 46.0 % Final   MCV 10/15/2020 86.4  80.0 - 100.0 fL Final   MCH 10/15/2020 26.9  26.0 - 34.0 pg Final   MCHC 10/15/2020 31.1  30.0 - 36.0 g/dL Final   RDW 17/61/6073 12.4  11.5 - 15.5 % Final   Platelets 10/15/2020 240  150 - 400 K/uL Final   nRBC 10/15/2020 0.0  0.0 - 0.2 % Final   Neutrophils Relative % 10/15/2020 80  % Final   Neutro Abs 10/15/2020 10.6 (H)  1.7 - 7.7 K/uL Final   Lymphocytes Relative 10/15/2020 11  % Final   Lymphs Abs 10/15/2020 1.5  0.7 - 4.0 K/uL Final   Monocytes Relative 10/15/2020 8  % Final   Monocytes Absolute 10/15/2020 1.1 (H)  0.1 - 1.0 K/uL Final  Eosinophils Relative 10/15/2020 1  % Final   Eosinophils Absolute 10/15/2020 0.1  0.0 - 0.5 K/uL Final   Basophils Relative 10/15/2020 0  % Final   Basophils Absolute 10/15/2020 0.0  0.0 - 0.1 K/uL Final   Immature Granulocytes 10/15/2020 0  % Final   Abs Immature Granulocytes 10/15/2020 0.04  0.00 - 0.07 K/uL Final   Performed at [facility], [place] [address] [place], [place] [address]   I-stat hCG, quantitative 10/15/2020 6.8 (H)  <5 mIU/mL Final   Comment 3 10/15/2020          Final   Comment:   GEST. AGE      CONC.  (mIU/mL)   <=1 WEEK        5 - 50     2 WEEKS       50 - 500     3 WEEKS       100 - 10,000     4 WEEKS     1,000 - 30,000        FEMALE AND NON-PREGNANT FEMALE:     LESS THAN 5 mIU/mL    hCG, Beta Chain, Quant, S 10/15/2020 1  <5 mIU/mL Final   Comment:          GEST. AGE      CONC.  (mIU/mL)   <=1 WEEK        5 - 50     2 WEEKS       50 - 500     3 WEEKS       100 - 10,000     4 WEEKS     1,000 - 30,000     5 WEEKS     3,500 - 115,000   6-8 WEEKS     12,000 - 270,000    12 WEEKS     15,000 - 220,000        FEMALE AND NON-PREGNANT FEMALE:     LESS THAN 5 mIU/mL Performed at [facility], [place] [address] [place], [place] [address]    SARS Coronavirus 2 by RT PCR 10/15/2020 NEGATIVE  NEGATIVE Final   Comment: (NOTE) SARS-CoV-2 target nucleic acids are NOT DETECTED.  The SARS-CoV-2 RNA is generally detectable in upper respiratoy specimens during the acute phase of infection. The lowest concentration of SARS-CoV-2 viral copies this assay can detect is 131 copies/mL. A negative result does not preclude SARS-Cov-2 infection and should not be used as the sole basis for treatment or other patient management decisions. A negative result may occur with  improper specimen collection/handling, submission of specimen other than nasopharyngeal swab, presence of viral mutation(s) within the areas targeted by this assay, and inadequate number of viral  copies (<131 copies/mL). A negative result must be combined with clinical observations, patient history, and epidemiological information. The expected result is Negative.  Fact Sheet for Patients:  PinkCheek.be  Fact Sheet for Healthcare Providers:  GravelBags.it  This test is no                          t yet approved or cleared by the [place] FDA and  has been authorized for detection and/or diagnosis of SARS-CoV-2 by FDA under an Emergency Use Authorization (EUA). This EUA will remain  in effect (meaning this test can be used) for the duration of the COVID-19 declaration under Section 564(b)(1) of the Act, 21 U.S.C. section 360bbb-3(b)(1), unless the authorization is  terminated or revoked sooner.     Influenza A by PCR 10/15/2020 NEGATIVE  NEGATIVE Final   Influenza B by PCR 10/15/2020 NEGATIVE  NEGATIVE Final   Comment: (NOTE) The Xpert Xpress SARS-CoV-2/FLU/RSV assay is intended as an aid in  the diagnosis of influenza from Nasopharyngeal swab specimens and  should not be used as a sole basis for treatment. Nasal washings and  aspirates are unacceptable for Xpert Xpress SARS-CoV-2/FLU/RSV  testing.  Fact Sheet for Patients: https://www.moore.com/  Fact Sheet for Healthcare Providers: https://www.young.biz/  This test is not yet approved or cleared by the [place] FDA and  has been authorized for detection and/or diagnosis of SARS-CoV-2 by  FDA under an Emergency Use Authorization (EUA). This EUA will remain  in effect (meaning this test can be used) for the duration of the  Covid-19 declaration under Section 564(b)(1) of the Act, 21  U.S.C. section 360bbb-3(b)(1), unless the authorization is  terminated or revoked. Performed at [facility], [address], [place], [place] [address]    HIV Screen 4th Generation wRfx 10/16/2020 Non Reactive  Non Reactive  Final   Performed at [facility], [address], [place], [place] [address]   Sodium 10/16/2020 137  135 - 145 mmol/L Final   Potassium 10/16/2020 3.2 (L)  3.5 - 5.1 mmol/L Final   Chloride 10/16/2020 107  98 - 111 mmol/L Final   CO2 10/16/2020 19 (L)  22 - 32 mmol/L Final   Glucose, Bld 10/16/2020 86  70 - 99 mg/dL Final   Glucose reference range applies only to samples taken after fasting for at least 8 hours.   BUN 10/16/2020 7  6 - 20 mg/dL Final   Creatinine, Ser 10/16/2020 0.58  0.44 - 1.00 mg/dL Final   Calcium 14/97/0263 8.1 (L)  8.9 - 10.3 mg/dL Final   GFR, Estimated 10/16/2020 >60  >60 mL/min Final   Comment: (NOTE) Calculated using the CKD-EPI Creatinine Equation (2021)    Anion gap 10/16/2020 11  5 - 15 Final   Performed at [facility], [address], [place], [place] [address]   WBC 10/16/2020 12.9 (H)  4.0 - 10.5 K/uL Final   RBC 10/16/2020 3.88  3.87 - 5.11 MIL/uL Final   Hemoglobin 10/16/2020 10.6 (L)  12.0 - 15.0 g/dL Final   HCT 50/27/7412 33.9 (L)  36.0 - 46.0 % Final   MCV 10/16/2020 87.4  80.0 - 100.0 fL Final   MCH 10/16/2020 27.3  26.0 - 34.0 pg Final   MCHC 10/16/2020 31.3  30.0 - 36.0 g/dL Final   RDW 87/86/7672 12.6  11.5 - 15.5 % Final   Platelets 10/16/2020 217  150 - 400 K/uL Final   nRBC 10/16/2020 0.0  0.0 - 0.2 % Final   Performed at [facility], [address], [place], [place] [address]   hCG, Conley Rolls, Sharene Butters, S 10/16/2020 1  <5 mIU/mL Final   Comment:          GEST. AGE      CONC.  (mIU/mL)   <=1 WEEK        5 - 50     2 WEEKS       50 - 500     3 WEEKS       100 - 10,000     4 WEEKS     1,000 - 30,000     5 WEEKS     3,500 - 115,000  6-8 WEEKS     12,000 - 270,000    12 WEEKS     15,000 - 220,000        FEMALE AND NON-PREGNANT FEMALE:     LESS THAN 5 mIU/mL Performed at [facility], [place] [address], [place], [place] [address]     Allergies: Patient has no known allergies.  PTA Medications: (Not in a  hospital admission)   Medical Decision Making  Inpatient observation  Meds ordered this encounter  Medications   acetaminophen (TYLENOL) tablet 650 mg   alum & mag hydroxide-simeth (MAALOX/MYLANTA) 200-200-20 MG/5ML suspension 30 mL   magnesium hydroxide (MILK OF MAGNESIA) suspension 30 mL   DISCONTD: alum & mag hydroxide-simeth (MAALOX/MYLANTA) 200-200-20 MG/5ML suspension 30 mL    Lab Orders         Resp Panel by RT-PCR (Flu A&B, Covid) Anterior Nasal Swab         CBC with Differential/Platelet         Comprehensive metabolic panel         Hemoglobin A1c         Ethanol         Lipid panel         TSH         Pregnancy, urine         POCT Urine Drug Screen - (I-Screen)         POCT Urine Drug Screen - (I-Screen)        Recommendations  Based on my evaluation the patient does not appear to have an emergency medical condition.  Evette Georges, NP 08/04/22  6:08 AM

## 2022-08-04 NOTE — Discharge Instructions (Addendum)
Transfer to [facility] 

## 2022-08-04 NOTE — ED Provider Notes (Cosign Needed)
Behavioral Health Progress Note  Date and Time: 08/04/2022 5:33 PM Name: Sarah Solis MRN:  [id number]  Subjective:   Sarah Solis is a 21 year old female with no significant past psychiatric history who presented to the [facility] behavioral health urgent care under involuntary commitment, paperwork filled out by her mother Sarah Solis, [phone].  Per IVC documentation, the patient has been hearing voices, seeing things that are not there.  She has not been eating and not attending to her hygiene.  She believes that people are after her.  On assessment this morning, the patient exhibits thought blocking and paucity of speech.  She appears to be responding to internal stimuli at points.  She exhibits poor eye contact and flat affect.  When asked what brought her into the behavioral health urgent care she states "there are people in my house".  She denies experiencing auditory hallucinations.  She reports suicidal thoughts "sometimes" but not now.  She reports having a good relationship with her mom.  She denies using any illegal drugs.  She states that she vapes nicotine from time to time.  She reports that her mood has been somewhat depressed recently due to "people in my past".  Called IVC petitioner and patient's mother at the number listed above.  She reports the patient was "totally normal" 3 months ago.  She states at that time the patient quit her jobs and isolated herself.  She says that she has been sitting on the couch in 1 spot not doing anything all day.  She states that at night the patient will go out into the rain and will burst into the rooms of her other children looking and checking windows to make sure no one is watching them.  The patient's mother feels she has been "drugged".  Discussed with her the strong possibility that the patient has a primary psychotic disorder, namely schizophrenia.  The patient's mother reports that the patient's father likely had  schizophrenia.  Diagnosis:  Final diagnoses:  Behavior concern in adult  Depressed affect  Psychotic paranoia (HCC)    Total Time spent with patient: 30 minutes  Past Psychiatric History: as above Past Medical History: No past medical history on file.  Past Surgical History:  Procedure Laterality Date   INCISION AND DRAINAGE PERIRECTAL ABSCESS Left 10/16/2020   Procedure: IRRIGATION AND DEBRIDEMENT PERIRECTAL ABSCESS;  Surgeon: Abigail Miyamoto, MD;  Location: [facility] OR;  Service: General;  Laterality: Left;   Family History: No family history on file. Family Psychiatric  History: Father with schizophrenia Social History:  Social History   Substance and Sexual Activity  Alcohol Use Not Currently     Social History   Substance and Sexual Activity  Drug Use Never    Social History   Socioeconomic History   Marital status: Single    Spouse name: Not on file   Number of children: Not on file   Years of education: Not on file   Highest education level: Not on file  Occupational History   Not on file  Tobacco Use   Smoking status: Never   Smokeless tobacco: Never  Vaping Use   Vaping Use: Never used  Substance and Sexual Activity   Alcohol use: Not Currently   Drug use: Never   Sexual activity: Not Currently  Other Topics Concern   Not on file  Social History Narrative   Not on file   Social Determinants of Health   Financial Resource Strain: Not on file  Food Insecurity: Not  on file  Transportation Needs: Not on file  Physical Activity: Not on file  Stress: Not on file  Social Connections: Not on file   SDOH:  SDOH Screenings   Alcohol Screen: Not on file  Depression (PHQ2-9): Not on file  Financial Resource Strain: Not on file  Food Insecurity: Not on file  Housing: Not on file  Physical Activity: Not on file  Social Connections: Not on file  Stress: Not on file  Tobacco Use: Low Risk  (12/21/2020)   Patient History    Smoking Tobacco Use: Never     Smokeless Tobacco Use: Never    Passive Exposure: Not on file  Transportation Needs: Not on file   Additional Social History:    Pain Medications: Denies use Prescriptions: Denies use Over the Counter: Denies use History of alcohol / drug use?: No history of alcohol / drug abuse Longest period of sobriety (when/how long): NA                    Sleep: Fair  Appetite:  Fair  Current Medications:  Current Facility-Administered Medications  Medication Dose Route Frequency Provider Last Rate Last Admin   acetaminophen (TYLENOL) tablet 650 mg  650 mg Oral Q6H PRN [name], NP       alum & mag hydroxide-simeth (MAALOX/MYLANTA) 200-200-20 MG/5ML suspension 30 mL  30 mL Oral Q4H PRN [name], NP       magnesium hydroxide (MILK OF MAGNESIA) suspension 30 mL  30 mL Oral Daily PRN [name], NP       risperiDONE (RISPERDAL) tablet 0.5 mg  0.5 mg Oral BID Carlyn Reichert, MD   0.5 mg at 08/04/22 1026   Current Outpatient Medications  Medication Sig Dispense Refill   acetaminophen (TYLENOL) 325 MG tablet Take 2-3 tablets (650-975 mg total) by mouth every 6 (six) hours as needed for mild pain (or temp > 100).     ibuprofen (ADVIL) 200 MG tablet Take 2 tablets (400 mg total) by mouth every 8 (eight) hours as needed.      Labs  Lab Results:  Admission on 08/04/2022  Component Date Value Ref Range Status   SARS Coronavirus 2 by RT PCR 08/04/2022 NEGATIVE  NEGATIVE Final   Comment: (NOTE) SARS-CoV-2 target nucleic acids are NOT DETECTED.  The SARS-CoV-2 RNA is generally detectable in upper respiratory specimens during the acute phase of infection. The lowest concentration of SARS-CoV-2 viral copies this assay can detect is 138 copies/mL. A negative result does not preclude SARS-Cov-2 infection and should not be used as the sole basis for treatment or other patient management decisions. A negative result may occur with  improper specimen collection/handling, submission  of specimen other than nasopharyngeal swab, presence of viral mutation(s) within the areas targeted by this assay, and inadequate number of viral copies(<138 copies/mL). A negative result must be combined with clinical observations, patient history, and epidemiological information. The expected result is Negative.  Fact Sheet for Patients:  BloggerCourse.com  Fact Sheet for Healthcare Providers:  SeriousBroker.it  This test is no                          t yet approved or cleared by the [place] FDA and  has been authorized for detection and/or diagnosis of SARS-CoV-2 by FDA under an Emergency Use Authorization (EUA). This EUA will remain  in effect (meaning this test can be used) for the duration of the COVID-19 declaration  under Section 564(b)(1) of the Act, 21 U.S.C.section 360bbb-3(b)(1), unless the authorization is terminated  or revoked sooner.       Influenza A by PCR 08/04/2022 NEGATIVE  NEGATIVE Final   Influenza B by PCR 08/04/2022 NEGATIVE  NEGATIVE Final   Comment: (NOTE) The Xpert Xpress SARS-CoV-2/FLU/RSV plus assay is intended as an aid in the diagnosis of influenza from Nasopharyngeal swab specimens and should not be used as a sole basis for treatment. Nasal washings and aspirates are unacceptable for Xpert Xpress SARS-CoV-2/FLU/RSV testing.  Fact Sheet for Patients: BloggerCourse.com  Fact Sheet for Healthcare Providers: SeriousBroker.it  This test is not yet approved or cleared by the [place] FDA and has been authorized for detection and/or diagnosis of SARS-CoV-2 by FDA under an Emergency Use Authorization (EUA). This EUA will remain in effect (meaning this test can be used) for the duration of the COVID-19 declaration under Section 564(b)(1) of the Act, 21 U.S.C. section 360bbb-3(b)(1), unless the authorization is terminated  or revoked.  Performed at [facility], [address] [place], [place] [address]    POC Amphetamine UR 08/04/2022 None Detected  NONE DETECTED (Cut Off Level 1000 ng/mL) Final   POC Secobarbital (BAR) 08/04/2022 None Detected  NONE DETECTED (Cut Off Level 300 ng/mL) Final   POC Buprenorphine (BUP) 08/04/2022 None Detected  NONE DETECTED (Cut Off Level 10 ng/mL) Final   POC Oxazepam (BZO) 08/04/2022 None Detected  NONE DETECTED (Cut Off Level 300 ng/mL) Final   POC Cocaine UR 08/04/2022 None Detected  NONE DETECTED (Cut Off Level 300 ng/mL) Final   POC Methamphetamine UR 08/04/2022 None Detected  NONE DETECTED (Cut Off Level 1000 ng/mL) Final   POC Morphine 08/04/2022 None Detected  NONE DETECTED (Cut Off Level 300 ng/mL) Final   POC Methadone UR 08/04/2022 None Detected  NONE DETECTED (Cut Off Level 300 ng/mL) Final   POC Oxycodone UR 08/04/2022 None Detected  NONE DETECTED (Cut Off Level 100 ng/mL) Final   POC Marijuana UR 08/04/2022 None Detected  NONE DETECTED (Cut Off Level 50 ng/mL) Final   SARSCOV2ONAVIRUS 2 AG 08/04/2022 NEGATIVE  NEGATIVE Final   Comment: (NOTE) SARS-CoV-2 antigen NOT DETECTED.   Negative results are presumptive.  Negative results do not preclude SARS-CoV-2 infection and should not be used as the sole basis for treatment or other patient management decisions, including infection  control decisions, particularly in the presence of clinical signs and  symptoms consistent with COVID-19, or in those who have been in contact with the virus.  Negative results must be combined with clinical observations, patient history, and epidemiological information. The expected result is Negative.  Fact Sheet for Patients: https://www.jennings-kim.com/  Fact Sheet for Healthcare Providers: https://alexander-rogers.biz/  This test is not yet approved or cleared by the [place] FDA and  has been authorized for detection and/or diagnosis of  SARS-CoV-2 by FDA under an Emergency Use Authorization (EUA).  This EUA will remain in effect (meaning this test can be used) for the duration of  the COV                          ID-19 declaration under Section 564(b)(1) of the Act, 21 U.S.C. section 360bbb-3(b)(1), unless the authorization is terminated or revoked sooner.     Preg Test, Ur 08/04/2022 NEGATIVE  NEGATIVE Final   Comment:        THE SENSITIVITY OF THIS METHODOLOGY IS >24 mIU/mL     Blood Alcohol  level:  No results found for: "ETH"  Metabolic Disorder Labs: No results found for: "HGBA1C", "MPG" No results found for: "PROLACTIN" No results found for: "CHOL", "TRIG", "HDL", "CHOLHDL", "VLDL", "LDLCALC"  Therapeutic Lab Levels: No results found for: "LITHIUM" No results found for: "VALPROATE" No results found for: "CBMZ"  Physical Findings   Flowsheet Row ED from 08/04/2022 in [facility]  C-SSRS RISK CATEGORY No Risk        Musculoskeletal  Strength & Muscle Tone: within normal limits Gait & Station: normal Patient leans: N/A  Psychiatric Specialty Exam  Presentation  General Appearance: Casual  Eye Contact:Poor  Speech:Blocked  Speech Volume:Decreased    Mood and Affect  Mood: depressed Affect:Blunt   Thought Process  Thought Processes:Disorganized  Descriptions of Associations:Circumstantial  Orientation:Full (Time, Place and Person)  Thought Content:Scattered  Diagnosis of Schizophrenia or Schizoaffective disorder in past: No  Duration of Psychotic Symptoms: Less than six months   Hallucinations:Hallucinations: None  Ideas of Reference:None  Suicidal Thoughts: denies  Homicidal Thoughts:Homicidal Thoughts: No   Sensorium  Memory:Immediate Fair; Recent Fair; Remote Fair  Judgment:Impaired  Insight:Poor   Executive Functions  Concentration:Poor  Attention Span:Poor  Recall:Poor  Fund of  Knowledge:Poor  Language:Poor   Psychomotor Activity  Psychomotor Activity:Psychomotor Activity: Normal   Assets  Assets:Communication Skills; Social Support; Housing   Sleep  Sleep:Sleep: Fair   Nutritional Assessment (For OBS and [facility] admissions only) Has the patient had a weight loss or gain of 10 pounds or more in the last 3 months?: No Has the patient had a decrease in food intake/or appetite?: No Does the patient have dental problems?: No Does the patient have eating habits or behaviors that may be indicators of an eating disorder including binging or inducing vomiting?: No Has the patient recently lost weight without trying?: 0 Has the patient been eating poorly because of a decreased appetite?: 0 Malnutrition Screening Tool Score: 0    Physical Exam  Physical Exam Constitutional:      Appearance: the patient is not toxic-appearing.  Pulmonary:     Effort: Pulmonary effort is normal.  Neurological:     General: No focal deficit present.     Mental Status: the patient is alert and oriented to person, place, and time.   Review of Systems  Respiratory:  Negative for shortness of breath.   Cardiovascular:  Negative for chest pain.  Gastrointestinal:  Negative for abdominal pain, constipation, diarrhea, nausea and vomiting.  Neurological:  Negative for headaches.   Blood pressure 111/82, pulse 77, temperature 98.2 F (36.8 C), temperature source Oral, resp. rate 18, SpO2 100 %. There is no height or weight on file to calculate BMI.  Treatment Plan Summary: Daily contact with patient to assess and evaluate symptoms and progress in treatment and Medication management  Status: Involuntary admission to the observation unit  #Schizophreniform disorder - Risperdal 0.5 mg twice daily, patient counseled on benefits and side effects and agrees to trial - Recommend [facility] once stabilized  Labs: [facility], UDS negative  Carlyn Reichert, MD 08/04/2022 5:33  PM

## 2022-08-04 NOTE — ED Notes (Signed)
Pt sleeping@this time. Breathing even and unlabored. Will continue to monitor for safety 

## 2022-08-05 ENCOUNTER — Inpatient Hospital Stay (HOSPITAL_COMMUNITY)
Admission: AD | Admit: 2022-08-05 | Discharge: 2022-08-13 | DRG: 885 | Disposition: A | Discharge status: Home / Self Care | Payer: Federal, State, Local not specified - Other | Source: Intra-hospital | Attending: Emergency Medicine | Admitting: Emergency Medicine

## 2022-08-05 ENCOUNTER — Encounter (HOSPITAL_COMMUNITY): Payer: Self-pay | Admitting: Student

## 2022-08-05 ENCOUNTER — Other Ambulatory Visit: Payer: Self-pay

## 2022-08-05 DIAGNOSIS — Z79899 Other long term (current) drug therapy: Secondary | ICD-10-CM

## 2022-08-05 DIAGNOSIS — E559 Vitamin D deficiency, unspecified: Secondary | ICD-10-CM | POA: Diagnosis present

## 2022-08-05 DIAGNOSIS — F17290 Nicotine dependence, other tobacco product, uncomplicated: Secondary | ICD-10-CM | POA: Diagnosis present

## 2022-08-05 DIAGNOSIS — F411 Generalized anxiety disorder: Secondary | ICD-10-CM | POA: Diagnosis present

## 2022-08-05 DIAGNOSIS — Z818 Family history of other mental and behavioral disorders: Secondary | ICD-10-CM | POA: Diagnosis not present

## 2022-08-05 DIAGNOSIS — K5900 Constipation, unspecified: Secondary | ICD-10-CM | POA: Diagnosis present

## 2022-08-05 DIAGNOSIS — F29 Unspecified psychosis not due to a substance or known physiological condition: Secondary | ICD-10-CM | POA: Diagnosis not present

## 2022-08-05 DIAGNOSIS — F2081 Schizophreniform disorder: Principal | ICD-10-CM | POA: Diagnosis present

## 2022-08-05 DIAGNOSIS — G4700 Insomnia, unspecified: Secondary | ICD-10-CM | POA: Diagnosis present

## 2022-08-05 DIAGNOSIS — F419 Anxiety disorder, unspecified: Secondary | ICD-10-CM

## 2022-08-05 MED ORDER — TRAZODONE HCL 50 MG PO TABS
50.0000 mg | ORAL_TABLET | Freq: Every evening | ORAL | Status: DC | PRN
  Administered 2022-08-09: 50 mg via ORAL
  Filled 2022-08-05: qty 1

## 2022-08-05 MED ORDER — POTASSIUM CHLORIDE CRYS ER 20 MEQ PO TBCR
20.0000 meq | EXTENDED_RELEASE_TABLET | Freq: Once | ORAL | Status: AC
  Administered 2022-08-05: 20 meq via ORAL
  Filled 2022-08-05: qty 1

## 2022-08-05 MED ORDER — RISPERIDONE 0.5 MG PO TABS
0.5000 mg | ORAL_TABLET | Freq: Every day | ORAL | Status: DC
  Administered 2022-08-06 – 2022-08-08 (×3): 0.5 mg via ORAL
  Filled 2022-08-05 (×5): qty 1

## 2022-08-05 MED ORDER — RISPERIDONE 0.5 MG PO TABS: 0.5000 mg | ORAL_TABLET | Freq: Every day | ORAL | Status: DC

## 2022-08-05 MED ORDER — RISPERIDONE 1 MG PO TABS: 1.0000 mg | ORAL_TABLET | Freq: Every day | ORAL | Status: DC

## 2022-08-05 MED ORDER — RISPERIDONE 1 MG PO TABS
1.0000 mg | ORAL_TABLET | Freq: Every day | ORAL | Status: DC
  Administered 2022-08-05 – 2022-08-07 (×3): 1 mg via ORAL
  Filled 2022-08-05 (×6): qty 1

## 2022-08-05 MED ORDER — ACETAMINOPHEN 325 MG PO TABS: 650.0000 mg | ORAL_TABLET | Freq: Four times a day (QID) | ORAL | Status: DC | PRN

## 2022-08-05 MED ORDER — HYDROXYZINE HCL 25 MG PO TABS
25.0000 mg | ORAL_TABLET | Freq: Three times a day (TID) | ORAL | Status: DC | PRN
  Administered 2022-08-06 – 2022-08-09 (×3): 25 mg via ORAL
  Filled 2022-08-05 (×3): qty 1

## 2022-08-05 MED ORDER — ALUM & MAG HYDROXIDE-SIMETH 200-200-20 MG/5ML PO SUSP: 30.0000 mL | ORAL | Status: DC | PRN

## 2022-08-05 MED ORDER — MAGNESIUM HYDROXIDE 400 MG/5ML PO SUSP: 30.0000 mL | Freq: Every day | ORAL | Status: DC | PRN

## 2022-08-05 MED ORDER — ENSURE ENLIVE PO LIQD
237.0000 mL | Freq: Two times a day (BID) | ORAL | Status: DC
  Administered 2022-08-06 – 2022-08-13 (×13): 237 mL via ORAL
  Filled 2022-08-05 (×20): qty 237

## 2022-08-05 NOTE — Progress Notes (Signed)
Patient was transferred involuntary from [facility]. Patient was IVC'd by mother d/t bizarre behaviors and paranoia. Patient's UDS was neg for substances. Patient says that she vapes nicotine and drinks alcohol on occasion. During admission, patient appeared to be thought blocking and was guarded on approach. She presented with blunted affect although stated her mood as calm. She did disclose that she was feeling lonely. She said that her goal for admission is to get help to "feel more motivated." Patient in agreement with POC and signed consent forms. RN oriented patient to the unit and provided water pitcher and juice. Patient was encouraged to drink fluids to help with low BP.

## 2022-08-05 NOTE — Tx Team (Signed)
Initial Treatment Plan 08/05/2022 4:39 PM [name] VVZ:[id number]    PATIENT STRESSORS: Occupational concerns   Other:     "People let me down."  PATIENT STRENGTHS: Capable of independent living  Physical Health  Supportive family/friends    PATIENT IDENTIFIED PROBLEMS: Possible psychosis  Insomnia  Anxiety                 DISCHARGE CRITERIA:  Ability to meet basic life and health needs Adequate post-discharge living arrangements Improved stabilization in mood, thinking, and/or behavior Motivation to continue treatment in a less acute level of care  PRELIMINARY DISCHARGE PLAN: Outpatient therapy Return to previous living arrangement  PATIENT/FAMILY INVOLVEMENT: This treatment plan has been presented to and reviewed with the patient, Sarah Solis.  The patient has been given the opportunity to ask questions and make suggestions.  Gardiner Barefoot, RN 08/05/2022, 4:39 PM

## 2022-08-05 NOTE — ED Provider Notes (Signed)
FBC/OBS ASAP Discharge Summary  Date and Time: 08/05/2022 4:24 PM  Name: Sarah Solis  MRN:  [id number]   Discharge Diagnoses:  Final diagnoses:  Behavior concern in adult  Depressed affect  Psychotic paranoia (HCC)  Schizophreniform disorder (HCC)    Subjective:  Sarah Solis is a 21 year old female with no significant past psychiatric history who presented to the [facility] behavioral health urgent care under involuntary commitment, paperwork filled out by her mother [name], [phone].  Per IVC documentation, the patient has been hearing voices, seeing things that are not there.  She has not been eating and not attending to her hygiene.  She believes that people are after her.  Other notable factors include that the patient's mother reports she was normal 3 months ago and abruptly quit her job and isolated herself.  She then subsequently developed erratic behavior and auditory hallucinations.  The patient's mother also reports the patient's father likely had schizophrenia.  It is also notable that the patient's urine drug screen was negative.  Feel strongly that the patient has schizophreniform disorder.  Stay Summary:  On initial assessment, the patient was noted to have thought blocking and paucity of speech.  It was thought that she might be responding to internal stimuli.  She had poor eye contact and a flat affect.  The patient was started on Risperdal 0.5 mg twice daily.  She tolerated this medication well.  Her dose is set to increase to 0.5 mg daily and 1 mg nightly tonight.  Recommend transition to [place] once stabilized.  The patient's mother is very active in the patient's care and plans to visit the patient at the behavioral health hospital tomorrow.    Total Time spent with patient: 15 minutes  Past Psychiatric History: as above Past Medical History: No past medical history on file.  Past Surgical History:  Procedure Laterality Date   INCISION  AND DRAINAGE PERIRECTAL ABSCESS Left 10/16/2020   Procedure: IRRIGATION AND DEBRIDEMENT PERIRECTAL ABSCESS;  Surgeon: Abigail Miyamoto, MD;  Location: [facility] OR;  Service: General;  Laterality: Left;   Family History: No family history on file. Family Psychiatric History: per H and P Social History:  Social History   Substance and Sexual Activity  Alcohol Use Not Currently     Social History   Substance and Sexual Activity  Drug Use Never    Social History   Socioeconomic History   Marital status: Single    Spouse name: Not on file   Number of children: Not on file   Years of education: Not on file   Highest education level: Not on file  Occupational History   Not on file  Tobacco Use   Smoking status: Never   Smokeless tobacco: Never  Vaping Use   Vaping Use: Never used  Substance and Sexual Activity   Alcohol use: Not Currently   Drug use: Never   Sexual activity: Not Currently  Other Topics Concern   Not on file  Social History Narrative   Not on file   Social Determinants of Health   Financial Resource Strain: Not on file  Food Insecurity: Not on file  Transportation Needs: Not on file  Physical Activity: Not on file  Stress: Not on file  Social Connections: Not on file   SDOH:  SDOH Screenings   Alcohol Screen: Not on file  Depression (OQH4-7): Not on file  Financial Resource Strain: Not on file  Food Insecurity: Not on file  Housing: Not on file  Physical Activity: Not on file  Social Connections: Not on file  Stress: Not on file  Tobacco Use: Low Risk  (12/21/2020)   Patient History    Smoking Tobacco Use: Never    Smokeless Tobacco Use: Never    Passive Exposure: Not on file  Transportation Needs: Not on file    Tobacco Cessation:  Prescription not provided because: transfer  Current Medications:  No current facility-administered medications for this encounter.   No current outpatient medications on file.   Facility-Administered  Medications Ordered in Other Encounters  Medication Dose Route Frequency Provider Last Rate Last Admin   acetaminophen (TYLENOL) tablet 650 mg  650 mg Oral Q6H PRN Carlyn Reichert, MD       alum & mag hydroxide-simeth (MAALOX/MYLANTA) 200-200-20 MG/5ML suspension 30 mL  30 mL Oral Q4H PRN Carlyn Reichert, MD       hydrOXYzine (ATARAX) tablet 25 mg  25 mg Oral TID PRN Carlyn Reichert, MD       magnesium hydroxide (MILK OF MAGNESIA) suspension 30 mL  30 mL Oral Daily PRN Carlyn Reichert, MD       Melene Muller ON 08/06/2022] risperiDONE (RISPERDAL) tablet 0.5 mg  0.5 mg Oral Daily Carlyn Reichert, MD       risperiDONE (RISPERDAL) tablet 1 mg  1 mg Oral QHS Carlyn Reichert, MD       traZODone (DESYREL) tablet 50 mg  50 mg Oral QHS PRN Carlyn Reichert, MD        PTA Medications: (Not in a hospital admission)       No data to display          Flowsheet Row ED from 08/04/2022 in [facility]  C-SSRS RISK CATEGORY No Risk       Musculoskeletal  Strength & Muscle Tone: within normal limits Gait & Station: normal Patient leans: N/A  Psychiatric Specialty Exam  Presentation  General Appearance: Casual  Eye Contact:Poor  Speech:Blocked  Speech Volume:Decreased    Mood and Affect  Mood:Euthymic  Affect:Blunt   Thought Process  Thought Processes:Disorganized  Descriptions of Associations:Circumstantial  Orientation:Full (Time, Place and Person)  Thought Content:Scattered  Diagnosis of Schizophrenia or Schizoaffective disorder in past: No  Duration of Psychotic Symptoms: Less than six months   Hallucinations:Hallucinations: None  Ideas of Reference:None  Suicidal Thoughts: "sometimes", contracts for safety Homicidal Thoughts:Homicidal Thoughts: No   Sensorium  Memory:Immediate Fair; Recent Fair; Remote Fair  Judgment:Impaired  Insight:Poor   Executive Functions  Concentration:Poor  Attention Span:Poor  Recall:Poor  Fund of  Knowledge:Poor  Language:Poor   Psychomotor Activity  Psychomotor Activity:Psychomotor Activity: Normal   Assets  Assets:Communication Skills; Social Support; Housing   Sleep  Sleep:Sleep: Fair   Nutritional Assessment (For OBS and [facility] admissions only) Has the patient had a weight loss or gain of 10 pounds or more in the last 3 months?: No Has the patient had a decrease in food intake/or appetite?: No Does the patient have dental problems?: No Does the patient have eating habits or behaviors that may be indicators of an eating disorder including binging or inducing vomiting?: No Has the patient recently lost weight without trying?: 0 Has the patient been eating poorly because of a decreased appetite?: 0 Malnutrition Screening Tool Score: 0    Physical Exam  Physical Exam Constitutional:      Appearance: the patient is not toxic-appearing.  Pulmonary:     Effort: Pulmonary effort is normal.  Neurological:     General: No focal deficit  present.     Mental Status: the patient is alert and oriented to person, place, and time.   Review of Systems  Respiratory:  Negative for shortness of breath.   Cardiovascular:  Negative for chest pain.  Gastrointestinal:  Negative for abdominal pain, constipation, diarrhea, nausea and vomiting.  Neurological:  Negative for headaches.   Blood pressure 115/88, pulse 96, temperature 98.3 F (36.8 C), resp. rate 18, SpO2 100 %. There is no height or weight on file to calculate BMI.  Demographic Factors:  Adolescent or young adult  Loss Factors: NA  Historical Factors: NA  Risk Reduction Factors:   NA  Continued Clinical Symptoms:  psychosis  Cognitive Features That Contribute To Risk:  None    Suicide Risk:  Mild: Patient intermittently reports some suicidal thoughts.  Expect this to improve with medication management and group therapy.   Plan Of Care/Follow-up recommendations:  Transfer to [facility] behavioral health  hospital  Disposition: Transfer to [facility] behavioral health hospital   Carlyn Reichert, MD 08/05/2022, 4:24 PM

## 2022-08-05 NOTE — Progress Notes (Signed)
The patient rated her day as a 10 out of 10 because she was able to make friends on the hallway. Her coping skill is to try and stay calm.

## 2022-08-05 NOTE — ED Notes (Signed)
Pt sleeping@this time. Breathing even and unlabored. Will continue to monitor for safety 

## 2022-08-05 NOTE — ED Notes (Signed)
Pt resting quietly.  Allowed for staff to obtain EKG this morning.  Denies HI, SI, AVH, and pain.  Breathing is even and unlabored.  Will continue to monitor for safety.

## 2022-08-05 NOTE — ED Notes (Signed)
Report has been called to [facility] nursing.  [facility] contacted to transport pt to [facility].

## 2022-08-06 DIAGNOSIS — F2081 Schizophreniform disorder: Principal | ICD-10-CM

## 2022-08-06 MED ORDER — ZIPRASIDONE MESYLATE 20 MG IM SOLR: 20.0000 mg | INTRAMUSCULAR | Status: DC | PRN

## 2022-08-06 MED ORDER — NICOTINE 14 MG/24HR TD PT24
14.0000 mg | MEDICATED_PATCH | Freq: Every day | TRANSDERMAL | Status: DC
  Administered 2022-08-06 – 2022-08-13 (×8): 14 mg via TRANSDERMAL
  Filled 2022-08-06 (×10): qty 1

## 2022-08-06 MED ORDER — RISPERIDONE 2 MG PO TBDP: 2.0000 mg | ORAL_TABLET | Freq: Three times a day (TID) | ORAL | Status: DC | PRN

## 2022-08-06 MED ORDER — LORAZEPAM 1 MG PO TABS: 1.0000 mg | ORAL_TABLET | ORAL | Status: DC | PRN

## 2022-08-06 NOTE — Progress Notes (Signed)
    08/06/22 0611  Vital Signs  Temp 98 F (36.7 C)  Temp Source Oral  Pulse Rate (!) 109  Pulse Rate Source Monitor  BP (!) 141/124  BP Location Right Arm  BP Method Automatic  Patient Position (if appropriate) Sitting  Oxygen Therapy  SpO2 99 %       08/06/22 0613  Vital Signs  Pulse Rate (!) 114  Pulse Rate Source Monitor  BP (!) 124/112  BP Location Right Arm  BP Method Automatic  Patient Position (if appropriate) Standing  Oxygen Therapy  SpO2 100 %    Pt is asymptomatic.  Pt appears anxious.  Administered Hydroxyzine per MAR per pt request.  Rechecked BP:   08/06/22 0638  Vital Signs  Pulse Rate 85  BP 100/66  BP Location Left Arm  BP Method Automatic  Patient Position (if appropriate) Sitting

## 2022-08-06 NOTE — [facility] Suicide Risk Assessment (Signed)
Suicide Risk Assessment  Admission Assessment    [facility] Admission Suicide Risk Assessment   Nursing information obtained from:  Patient  Demographic factors:  Adolescent or young adult, Unemployed  Current Mental Status:  NA  Loss Factors:  NA  Historical Factors:  NA  Risk Reduction Factors:  Living with another person, especially a relative, Positive social support  Total Time spent with patient: 1 hour  Principal Problem: Schizophreniform disorder (HCC)  Diagnosis:  Principal Problem:   Schizophreniform disorder (HCC)  Subjective Data: See H&P.  Continued Clinical Symptoms:  Alcohol Use Disorder Identification Test Final Score (AUDIT): 1 The "Alcohol Use Disorders Identification Test", Guidelines for Use in Primary Care, Second Edition.  World Science writer [facility]). Score between 0-7:  no or low risk or alcohol related problems. Score between 8-15:  moderate risk of alcohol related problems. Score between 16-19:  high risk of alcohol related problems. Score 20 or above:  warrants further diagnostic evaluation for alcohol dependence and treatment.  CLINICAL FACTORS:   Schizophrenia:   Paranoid or undifferentiated type  Musculoskeletal: Strength & Muscle Tone: within normal limits Gait & Station: normal Patient leans: N/A  Psychiatric Specialty Exam:  Presentation  General Appearance: Appropriate for Environment; Disheveled; Casual  Eye Contact:Minimal  Speech:Clear and Coherent; Normal Rate  Speech Volume:Decreased  Handedness:Right   Mood and Affect  Mood:Anxious; Depressed  Affect:Congruent; Restricted  Thought Process  Thought Processes:Coherent; Goal Directed  Descriptions of Associations:Circumstantial  Orientation:Full (Time, Place and Person)  Thought Content:Paranoid Ideation  History of Schizophrenia/Schizoaffective disorder:No  Duration of Psychotic Symptoms:N/A  Hallucinations:Hallucinations: Other (comment) Description of  Auditory Hallucinations: Reports hx of AH prior to admission. Denies any AH at this time.  Ideas of Reference:Paranoia  Suicidal Thoughts:Suicidal Thoughts: No SI Passive Intent and/or Plan: Without Intent; Without Plan; Without Means to Carry Out; Without Access to Means  Homicidal Thoughts:Homicidal Thoughts: No   Sensorium  Memory:Immediate Fair; Remote Fair; Recent Fair  Judgment:Impaired  Insight:Fair  Executive Functions  Concentration:Fair  Attention Span:Fair  Recall:Fair  Fund of Knowledge:Fair  Language:Good   Psychomotor Activity  Psychomotor Activity:Psychomotor Activity: Decreased  Assets  Assets:Communication Skills; Desire for Improvement; Financial Resources/Insurance; Housing; Resilience; Social Support; Physical Health   Sleep  Sleep:Sleep: Good Number of Hours of Sleep: 7.25   Physical Exam: See H&P. Blood pressure 100/66, pulse 85, temperature 98 F (36.7 C), temperature source Oral, resp. rate 16, height 5\' 5"  (1.651 m), weight 59.9 kg, SpO2 100 %. Body mass index is 21.97 kg/m.  COGNITIVE FEATURES THAT CONTRIBUTE TO RISK:  Closed-mindedness, Loss of executive function, Polarized thinking, and Thought constriction (tunnel vision)    SUICIDE RISK:   Severe:  Frequent, intense, and enduring suicidal ideation, specific plan, no subjective intent, but some objective markers of intent (i.e., choice of lethal method), the method is accessible, some limited preparatory behavior, evidence of impaired self-control, severe dysphoria/symptomatology, multiple risk factors present, and few if any protective factors, particularly a lack of social support.  PLAN OF CARE: See H&P.  I certify that inpatient services furnished can reasonably be expected to improve the patient's condition.   , NP, pmhnp, [id number]. 08/06/2022, 2:26 PM

## 2022-08-06 NOTE — Progress Notes (Deleted)
     08/05/22 2047  Psych Admission Type (Psych Patients Only)  Admission Status Involuntary  Psychosocial Assessment  Patient Complaints Insomnia  Eye Contact Fair  Facial Expression Flat  Affect Blunted  Speech Soft;Slow;Logical/coherent  Interaction Cautious;Forwards little;Flirtatious  Motor Activity Other (Comment) (WDL)  Appearance/Hygiene Unremarkable  Behavior Characteristics Cooperative;Calm  Mood Pleasant  Thought Process  Coherency Blocking  Content WDL  Delusions None reported or observed  Perception WDL  Hallucination None reported or observed  Judgment Impaired  Confusion None  Danger to Self  Current suicidal ideation? Denies  Self-Injurious Behavior No self-injurious ideation or behavior indicators observed or expressed   Agreement Not to Harm Self Yes  Description of Agreement verbal  Danger to Others  Danger to Others None reported or observed

## 2022-08-06 NOTE — [facility] Counselor (Signed)
Adult Comprehensive Assessment  Patient ID: Sarah Solis, female   DOB: 29-Apr-2001, 21 y.o.   MRN: [id number]  Information Source: Information source: Patient  Current Stressors:  Patient states their primary concerns and needs for treatment are:: "Depression, anxiety, auditory and visual hallucinations, and suicidal thoughts" Patient states their goals for this hospitilization and ongoing recovery are:: "To manage my stress" Educational / Learning stressors: Pt reports having a 12th grade education Employment / Job issues: Pt reports being unemployed Family Relationships: Pt reports no stressors Surveyor, quantity / Lack of resources (include bankruptcy): Pt reports having no income or Water engineer / Lack of housing: Pt reports living with her mother Physical health (include injuries & life threatening diseases): Pt reports no stressors Social relationships: Pt reports having few social relationships Substance abuse: Pt reports drinking alcohol on special occasions Bereavement / Loss: Pt reports no stressors  Living/Environment/Situation:  Living Arrangements: Parent Living conditions (as described by patient or guardian): House/[place] Who else lives in the home?: Mother How long has patient lived in current situation?: 2 years What is atmosphere in current home: Comfortable, Supportive  Family History:  Marital status: Single Are you sexually active?: No What is your sexual orientation?: Heterosexual Has your sexual activity been affected by drugs, alcohol, medication, or emotional stress?: No Does patient have children?: No  Childhood History:  By whom was/is the patient raised?: Mother Additional childhood history information: Pt reports her father lived in [place] and she visited him occasionally Description of patient's relationship with caregiver when they were a child: "We got along good" Patient's description of current relationship with people who raised  him/her: "We still get along good" How were you disciplined when you got in trouble as a child/adolescent?: Spankings Does patient have siblings?: Yes Number of Siblings: 3 Description of patient's current relationship with siblings: "I get along good with them" Did patient suffer any verbal/emotional/physical/sexual abuse as a child?: No Did patient suffer from severe childhood neglect?: No Has patient ever been sexually abused/assaulted/raped as an adolescent or adult?: No Was the patient ever a victim of a crime or a disaster?: No Witnessed domestic violence?: No Has patient been affected by domestic violence as an adult?: No  Education:  Highest grade of school patient has completed: 12th grade Currently a student?: No Learning disability?: No  Employment/Work Situation:   Employment Situation: Unemployed Patient's Job has Been Impacted by Current Illness: No What is the Longest Time Patient has Held a Job?: 2 years Where was the Patient Employed at that Time?: [employer] Has Patient ever Been in the [employer]?: No  Financial Resources:   Surveyor, quantity resources: No income, Support from parents / caregiver Does patient have a Lawyer or guardian?: No  Alcohol/Substance Abuse:   What has been your use of drugs/alcohol within the last 12 months?: Pt reports drinking alcohol on special occasions If attempted suicide, did drugs/alcohol play a role in this?: No Alcohol/Substance Abuse Treatment Hx: Denies past history Has alcohol/substance abuse ever caused legal problems?: No  Social Support System:   Conservation officer, nature Support System: Poor Describe Community Support System: "My mother" Type of faith/religion: "I believe in God" How does patient's faith help to cope with current illness?: Prayer  Leisure/Recreation:   Do You Have Hobbies?: Yes Leisure and Hobbies: "I don't enjoy doing things anymore"  Strengths/Needs:   What is the patient's perception of their  strengths?: "Motivation and Resilience" Patient states they can use these personal strengths during their treatment to contribute  to their recovery: "To help me get through things with my mental health" Patient states these barriers may affect/interfere with their treatment: None Patient states these barriers may affect their return to the community: None Other important information patient would like considered in planning for their treatment: None  Discharge Plan:   Currently receiving community mental health services: No Patient states concerns and preferences for aftercare planning are: Pt is interested in therapy and medication management Patient states they will know when they are safe and ready for discharge when: "I don't know" Does patient have access to transportation?: Yes (Mother) Does patient have financial barriers related to discharge medications?: Yes Patient description of barriers related to discharge medications: Limited income and no medical insurance Will patient be returning to same living situation after discharge?: Yes  Summary/Recommendations:   Summary and Recommendations (to be completed by the evaluator): Sarah Solis is a 21 year old, female, who was admitted to the hospital due to worsening depression, anxiety, auditory and visual hallucinations, and suicidal thoughts.  The Pt reports that her symptoms have been worsening for approximately 6 months.  She states that she is living with her mother.  She states that she is close with her mother and 3 siblings.  The Pt reports that her father lives in [place] and she has visited with him in the past.  She denies any childhood abuse or trauma.  She reports having a 12th grade education, being unemployed, and having no medical insurance.  She states that her mother helps her financially.  The Pt reports using alcohol on special occasion and denies any other substance use.  The Pt also denies any current or previous  substance use treatment.  While in the hospital the Pt can benefit from crisis stabilization, medication evaluation, group therapy, psycho-education, case management, and discharge planning.  Upon discharge the Pt would like to return to her mother's home.  It is recommended that the Pt follow-up with a local outpatient provider for therapy and medication management services.  It is also recommneded that the Pt continue taking all medications as prescribed by her providers.  Aram Beecham. 08/06/2022

## 2022-08-06 NOTE — [facility] Suicide Risk Assessment (Signed)
[facility] INPATIENT:  Family/Significant Other Suicide Prevention Education  Suicide Prevention Education:  Education Completed; Sarah Solis [phone] (Mother) has been identified by the patient as the family member/significant other with whom the patient will be residing, and identified as the person(s) who will aid the patient in the event of a mental health crisis (suicidal ideations/suicide attempt).  With written consent from the patient, the family member/significant other has been provided the following suicide prevention education, prior to the and/or following the discharge of the patient.  The suicide prevention education provided includes the following: Suicide risk factors Suicide prevention and interventions National Suicide Hotline telephone number [facility] assessment telephone number [facility] Emergency Assistance 911 [facility] and/or Residential Mobile Crisis Unit telephone number  Request made of family/significant other to: Remove weapons (e.g., guns, rifles, knives), all items previously/currently identified as safety concern.   Remove drugs/medications (over-the-counter, prescriptions, illicit drugs), all items previously/currently identified as a safety concern.  The family member/significant other verbalizes understanding of the suicide prevention education information provided.  The family member/significant other agrees to remove the items of safety concern listed above.  CSW spoke with Sarah Solis who states that her daughter has been experiencing paranoia and not eating well for the past 4 months.  She states that her daughter has also been isolating for the past 2 years.  She states that her daughter was previously working 2 jobs and was outgoing and social.  She states that she is not aware of any traumas or concerns that would trigger these new behaviors.  Mrs. Sarah Solis states that her daughter went to visit her father for 1 month is [place]  and came back home 3 weeks ago because "her father could not handle her behaviors".  Mrs. Sarah Solis confirms that her daughter will be returning to her home after discharge and that there are no firearms or weapons in the home.  Sarah Solis also states that she is interested in applying for Emergency Guardianship and Disability for her daughter.  She states that her daughter does have [facility].  CSW completed SPE with Sarah Solis.   Sarah Solis 08/06/2022, 11:08 AM
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
## 2022-08-06 NOTE — Plan of Care (Signed)
  Problem: Education: Goal: Emotional status will improve Outcome: Not Progressing Goal: Mental status will improve Outcome: Not Progressing Goal: Verbalization of understanding the information provided will improve Outcome: Not Progressing   

## 2022-08-06 NOTE — Progress Notes (Signed)
   08/05/22 2047  Psych Admission Type (Psych Patients Only)  Admission Status Involuntary  Psychosocial Assessment  Patient Complaints Insomnia  Eye Contact Fair  Facial Expression Flat  Affect Blunted  Speech Soft;Slow;Logical/coherent  Interaction Cautious;Forwards little  Motor Activity Other (Comment) (WDL)  Appearance/Hygiene Unremarkable  Behavior Characteristics Cooperative;Calm  Mood Pleasant  Thought Process  Coherency Blocking  Content WDL  Delusions None reported or observed  Perception WDL  Hallucination None reported or observed  Judgment Impaired  Confusion None  Danger to Self  Current suicidal ideation? Denies  Self-Injurious Behavior No self-injurious ideation or behavior indicators observed or expressed   Agreement Not to Harm Self Yes  Description of Agreement verbal  Danger to Others  Danger to Others None reported or observed

## 2022-08-06 NOTE — [facility] Group Notes (Signed)
Adult Psychoeducational Group Note  Date:  08/06/2022 Time:  10:16 AM  Group Topic/Focus:  Goals Group:   The focus of this group is to help patients establish daily goals to achieve during treatment and discuss how the patient can incorporate goal setting into their daily lives to aide in recovery.  Participation Level:  Did Not Attend  Additional Comments:  Patient was told multiple times that group was starting but did not attend.   Sarah Solis T Mi Balla 08/06/2022, 10:16 AM

## 2022-08-06 NOTE — Plan of Care (Signed)
  Problem: Education: Goal: Knowledge of [facility] Education information/materials will improve Outcome: Progressing Goal: Verbalization of understanding the information provided will improve Outcome: Progressing   Problem: Physical Regulation: Goal: Ability to maintain clinical measurements within normal limits will improve Outcome: Progressing   Problem: Safety: Goal: Periods of time without injury will increase Outcome: Progressing   Problem: Activity: Goal: Interest or engagement in activities will improve Outcome: Not Progressing   Problem: Self-Concept: Goal: Ability to identify factors that promote anxiety will improve Outcome: Not Progressing Goal: Ability to modify response to factors that promote anxiety will improve Outcome: Not Progressing

## 2022-08-06 NOTE — Progress Notes (Signed)
     08/06/22 2047  Psych Admission Type (Psych Patients Only)  Admission Status Involuntary  Psychosocial Assessment  Patient Complaints None  Eye Contact Fair  Facial Expression Flat  Affect Blunted  Speech Soft;Slow;Logical/coherent  Interaction Forwards little;Guarded  Motor Activity Slow  Appearance/Hygiene Unremarkable  Behavior Characteristics Cooperative;Calm  Mood Pleasant;Depressed  Thought Process  Coherency Blocking  Content WDL  Delusions None reported or observed  Perception WDL  Hallucination None reported or observed  Judgment Impaired  Confusion None  Danger to Self  Current suicidal ideation? Denies  Self-Injurious Behavior No self-injurious ideation or behavior indicators observed or expressed   Agreement Not to Harm Self Yes  Description of Agreement verbal  Danger to Others  Danger to Others None reported or observed

## 2022-08-06 NOTE — Progress Notes (Signed)
Patient appears quiet and flat. Patient denies SI/HI/AVH. Pt is minimal and guarded. Patient complied with morning medication with no reported side effects. Patient remains safe on Q10min checks and contracts for safety.       08/06/22 9937  Psych Admission Type (Psych Patients Only)  Admission Status Involuntary  Psychosocial Assessment  Patient Complaints Insomnia  Eye Contact Fair  Facial Expression Flat  Affect Blunted  Speech Soft;Slow  Interaction Guarded;Forwards little  Motor Activity Slow  Appearance/Hygiene Unremarkable  Behavior Characteristics Cooperative;Calm  Mood Depressed;Pleasant  Thought Process  Coherency Blocking  Content WDL  Delusions None reported or observed  Perception WDL  Hallucination None reported or observed  Judgment Impaired  Confusion None  Danger to Self  Current suicidal ideation? Denies  Self-Injurious Behavior No self-injurious ideation or behavior indicators observed or expressed   Agreement Not to Harm Self Yes  Description of Agreement verbal  Danger to Others  Danger to Others None reported or observed

## 2022-08-06 NOTE — Plan of Care (Signed)
  Problem: Activity: Goal: Interest or engagement in activities will improve Outcome: Progressing Goal: Sleeping patterns will improve Outcome: Progressing   Problem: Education: Goal: Verbalization of understanding the information provided will improve Outcome: Not Progressing

## 2022-08-07 ENCOUNTER — Encounter (HOSPITAL_COMMUNITY): Payer: Self-pay

## 2022-08-07 DIAGNOSIS — F2081 Schizophreniform disorder: Secondary | ICD-10-CM | POA: Diagnosis not present

## 2022-08-07 MED ORDER — MIRTAZAPINE 7.5 MG PO TABS
7.5000 mg | ORAL_TABLET | Freq: Every day | ORAL | Status: DC
  Administered 2022-08-07 – 2022-08-08 (×2): 7.5 mg via ORAL
  Filled 2022-08-07 (×3): qty 1

## 2022-08-07 NOTE — Plan of Care (Signed)
°  Problem: Education: °Goal: Emotional status will improve °Outcome: Progressing °Goal: Mental status will improve °Outcome: Progressing °  °Problem: Activity: °Goal: Interest or engagement in activities will improve °Outcome: Progressing °  °

## 2022-08-07 NOTE — Progress Notes (Signed)
[facility] MD Progress Note  08/07/2022 10:34 AM Sarah Solis  MRN:  [id number]  Reason for admission: 21 year old African-American female with no prior hx of mental health issues. Admitted to the [facility] from the [facility] under IVC petition taken out by her mother. Her chief complain includes, worsening psychosis (auditory/visual hallucinations), not eating or attending to her personal hygiene. Chart review reports also state that she was paranoid (believed that people were after her) & presenting with erratic behaviors. There may be hx of schizophrenia in the family (patient's father). Her urine drug screen was negative of all substances.  Daily notes: Elsa is seen, chart reviewed. The chart findings discussed with the treatment team. She presents alert, oriented & aware of situation. She is visible on the unit, attending group sessions. She is verbally responsive. Her eye contact is improved. She presents with an improved/reactive affect. She says she is doing/feeling better today, but says she feels a little depressed still. She rates her depression #3 & anxiety #4. Says her anxiety is coming from being around people she does not know. She says she is feeling depressed because of the thoughts she has been having that people were after her & her family. She thought that people were trying to harm her family because people think that they have a lot of money, which is not true because her family does not have a lot of money. She says she slept well last night. Says she ate a good breakfast. She is taking & tolerating her treatment regimen. Willadene denies any side effects. She currently denies any SIHI, AVH, delusional thoughts or paranoia. She does not appear to be responding to any internal stimuli. Patient presents much groomed than when first admitted to the hospital. Reviewed current lab results, stable. Reviewed vital signs, stable. Will obtain u/a. Will continue current plan of care as already in progress. Started  on Mirtazapine 7.5 mg po Q hs for insomnia/depression.  Principal Problem: Schizophreniform disorder (HCC)  Diagnosis: Principal Problem:   Schizophreniform disorder (HCC)  Total Time spent with patient: 1 hour  Past Psychiatric History: See H&P.  Past Medical History: History reviewed. No pertinent past medical history.  Past Surgical History:  Procedure Laterality Date   INCISION AND DRAINAGE PERIRECTAL ABSCESS Left 10/16/2020   Procedure: IRRIGATION AND DEBRIDEMENT PERIRECTAL ABSCESS;  Surgeon: Abigail Miyamoto, MD;  Location: [facility] OR;  Service: General;  Laterality: Left;   Family History: History reviewed. No pertinent family history.  Family Psychiatric  History: See H&P.  Social History:  Social History   Substance and Sexual Activity  Alcohol Use Not Currently     Social History   Substance and Sexual Activity  Drug Use Never    Social History   Socioeconomic History   Marital status: Single    Spouse name: Not on file   Number of children: Not on file   Years of education: Not on file   Highest education level: Not on file  Occupational History   Not on file  Tobacco Use   Smoking status: Never   Smokeless tobacco: Never  Vaping Use   Vaping Use: Some days  Substance and Sexual Activity   Alcohol use: Not Currently   Drug use: Never   Sexual activity: Not Currently  Other Topics Concern   Not on file  Social History Narrative   Not on file   Social Determinants of Health   Financial Resource Strain: Not on file  Food Insecurity: Not on file  Transportation Needs: Not on file  Physical Activity: Not on file  Stress: Not on file  Social Connections: Not on file   Additional Social History:   Sleep: Good  Appetite:   Improving.  Current Medications: Current Facility-Administered Medications  Medication Dose Route Frequency Provider Last Rate Last Admin   acetaminophen (TYLENOL) tablet 650 mg  650 mg Oral Q6H PRN Carlyn Reichert, MD        alum & mag hydroxide-simeth (MAALOX/MYLANTA) 200-200-20 MG/5ML suspension 30 mL  30 mL Oral Q4H PRN Carlyn Reichert, MD       feeding supplement (ENSURE ENLIVE / ENSURE PLUS) liquid 237 mL  237 mL Oral BID BM Starleen Blue, NP   237 mL at 08/07/22 0805   hydrOXYzine (ATARAX) tablet 25 mg  25 mg Oral TID PRN Carlyn Reichert, MD   25 mg at 08/06/22 0620   risperiDONE (RISPERDAL M-TABS) disintegrating tablet 2 mg  2 mg Oral Q8H PRN Armandina Stammer I, NP       And   LORazepam (ATIVAN) tablet 1 mg  1 mg Oral PRN Armandina Stammer I, NP       And   ziprasidone (GEODON) injection 20 mg  20 mg Intramuscular PRN Armandina Stammer I, NP       magnesium hydroxide (MILK OF MAGNESIA) suspension 30 mL  30 mL Oral Daily PRN Carlyn Reichert, MD       nicotine (NICODERM CQ - dosed in mg/24 hours) patch 14 mg  14 mg Transdermal Daily Armandina Stammer I, NP   14 mg at 08/07/22 0806   risperiDONE (RISPERDAL) tablet 0.5 mg  0.5 mg Oral Daily Carlyn Reichert, MD   0.5 mg at 08/07/22 0805   risperiDONE (RISPERDAL) tablet 1 mg  1 mg Oral QHS Carlyn Reichert, MD   1 mg at 08/06/22 2047   traZODone (DESYREL) tablet 50 mg  50 mg Oral QHS PRN Carlyn Reichert, MD       Lab Results: No results found for this or any previous visit (from the past 48 hour(s)).  Blood Alcohol level:  Lab Results  Component Value Date   ETH <10 08/04/2022   Metabolic Disorder Labs: Lab Results  Component Value Date   HGBA1C 5.1 08/04/2022   MPG 99.67 08/04/2022   No results found for: "PROLACTIN" Lab Results  Component Value Date   CHOL 144 08/04/2022   TRIG 30 08/04/2022   HDL 35 (L) 08/04/2022   CHOLHDL 4.1 08/04/2022   VLDL 6 08/04/2022   LDLCALC 103 (H) 08/04/2022   Physical Findings: AIMS:  , ,  ,  ,    CIWA:    COWS:     Musculoskeletal: Strength & Muscle Tone: within normal limits Gait & Station: normal Patient leans: N/A  Psychiatric Specialty Exam:  Presentation  General Appearance: Appropriate for Environment; Disheveled;  Casual  Eye Contact:Minimal  Speech:Clear and Coherent; Normal Rate  Speech Volume:Decreased  Handedness:Right   Mood and Affect  Mood:Anxious; Depressed  Affect:Congruent; Restricted  Thought Process  Thought Processes:Coherent; Goal Directed  Descriptions of Associations:Circumstantial  Orientation:Full (Time, Place and Person)  Thought Content:Paranoid Ideation  History of Schizophrenia/Schizoaffective disorder:No  Duration of Psychotic Symptoms:N/A  Hallucinations:Hallucinations: Other (comment) Description of Auditory Hallucinations: Reports hx of AH prior to admission. Denies any AH at this time.  Ideas of Reference:Paranoia  Suicidal Thoughts:Suicidal Thoughts: No SI Passive Intent and/or Plan: Without Intent; Without Plan; Without Means to Carry Out; Without Access to Means  Homicidal Thoughts:Homicidal Thoughts: No  Sensorium  Memory:Immediate Fair; Remote Fair; Recent Fair  Judgment:Impaired  Insight:Fair  Executive Functions  Concentration:Fair  Attention Span:Fair  Recall:Fair  [employer] of Knowledge:Fair  Language:Good  Psychomotor Activity  Psychomotor Activity:Psychomotor Activity: Decreased  Assets  Assets:Communication Skills; Desire for Improvement; Financial Resources/Insurance; Housing; Resilience; Social Support; Physical Health  Sleep  Sleep:Sleep: Good Number of Hours of Sleep: 7.25  Physical Exam: Physical Exam Vitals and nursing note reviewed.  HENT:     Nose: Nose normal.     Mouth/Throat:     Pharynx: Oropharynx is clear.  Eyes:     Pupils: Pupils are equal, round, and reactive to light.  Cardiovascular:     Rate and Rhythm: Normal rate.     Pulses: Normal pulses.  Pulmonary:     Effort: Pulmonary effort is normal.  Genitourinary:    Comments: Deferred Musculoskeletal:        General: Normal range of motion.     Cervical back: Normal range of motion.  Skin:    General: Skin is warm and dry.  Neurological:      General: No focal deficit present.     Mental Status: She is alert and oriented to person, place, and time.    Review of Systems  Constitutional:  Negative for chills and fever.  HENT:  Negative for congestion and sore throat.   Respiratory:  Negative for cough, shortness of breath and wheezing.   Cardiovascular:  Negative for chest pain and palpitations.  Gastrointestinal:  Negative for abdominal pain, constipation, diarrhea, heartburn, nausea and vomiting.  Genitourinary:  Negative for dysuria.  Musculoskeletal:  Negative for joint pain and myalgias.  Skin: Negative.   Neurological:  Negative for dizziness, tingling, tremors, sensory change, speech change, focal weakness, seizures, loss of consciousness, weakness and headaches.  Endo/Heme/Allergies:        Allergies: NKDA  Psychiatric/Behavioral:  Positive for depression and hallucinations (Reports feeling paranoid.). Negative for memory loss, substance abuse and suicidal ideas. The patient is nervous/anxious. The patient does not have insomnia.    Blood pressure (!) 107/56, pulse 81, temperature 97.7 F (36.5 C), temperature source Oral, resp. rate 16, height 5\' 5"  (1.651 m), weight 59.9 kg, SpO2 100 %. Body mass index is 21.97 kg/m.  Treatment Plan Summary: Daily contact with patient to assess and evaluate symptoms and progress in treatment and Medication management.   Continue inpatient hospitalization.  Will continue today 08/07/2022 plan as below except where it is noted.   PLAN Safety and Monitoring: Voluntary admission to inpatient psychiatric unit for safety, stabilization and treatment Daily contact with patient to assess and evaluate symptoms and progress in treatment Patient's case discussed in multi-disciplinary team meeting Observation Level : q15 minute checks Vital signs: q12 hours Precautions: Safety   Long Term Goal(s): Improvement in symptoms so as ready for discharge   Diagnoses Principal  Problem: Schizophreniform disorder (HCC)   Medications. -Continue Risperdal 0.5 5 mg Q daily for mood control. -Continue Risperdal 1 mg po Q bedtime for mood control. -Continue Trazodone 50 mg nightly prn for insomnia.  -Initiated Mirtazapine 7.5 mg po daily for depression/insomnia. -Continue Hydroxyzine 25 mg po tid prn for anxiety.  -Restarted Nicorette gum 2 mg prn for nicotine withdrawal.  -Initiated Nicotine patch 14 mg trans-dermally Q 24 hours for nicotine withdrawal management.   Agitation/psychosis protocols.  -Risperdal M-tabs 2 mg po Q 8 hours prn.  &  -Lorazepam 1 mg po prn x 1 dose.  & Geodon 20 mg IM prn x 1 dose.  Other PRNS -Continue Tylenol 650 mg every 6 hours PRN for mild pain -Continue Maalox 30 ml Q 4 hrs PRN for indigestion -Continue MOM 30 ml po Q 6 hrs for constipation   Discharge Planning: Social work and case management to assist with discharge planning and identification of hospital follow-up needs prior to discharge Estimated LOS: 5-7 days Discharge Concerns: Need to establish a safety plan; Medication compliance and effectiveness Discharge Goals: Return home with outpatient referrals for mental health follow-up including medication management/psychotherapy   Armandina Stammer, NP, pmhnp, fnp-bc. 08/07/2022, 10:34 AM

## 2022-08-07 NOTE — BHH Group Notes (Signed)
Adult Psychoeducational Group Note  Date:  08/07/2022 Time:  9:33 AM  Group Topic/Focus:  Goals Group:   The focus of this group is to help patients establish daily goals to achieve during treatment and discuss how the patient can incorporate goal setting into their daily lives to aide in recovery.  Participation Level:  Active  Participation Quality:  Appropriate  Affect:  Appropriate  Cognitive:  Appropriate  Insight: Appropriate  Engagement in Group:  Engaged  Modes of Intervention:  Discussion  Additional Comments:  Patient attended morning orientation/goal setting group and said that her goal for today is to stay awake. She admits that she has been sleeping a lot since she came in.   Olivette Beckmann W Agamjot Kilgallon 08/07/2022, 9:33 AM

## 2022-08-07 NOTE — BH IP Treatment Plan (Signed)
Interdisciplinary Treatment and Diagnostic Plan Update  08/07/2022 Time of Session: 10:10am Sarah Solis MRN: [id number]  Principal Diagnosis: Schizophreniform disorder [facility])  Secondary Diagnoses: Principal Problem:   Schizophreniform disorder ([place])   Current Medications:  Current Facility-Administered Medications  Medication Dose Route Frequency Provider Last Rate Last Admin   acetaminophen (TYLENOL) tablet 650 mg  650 mg Oral Q6H PRN Corky Sox, MD       alum & mag hydroxide-simeth (MAALOX/MYLANTA) 200-200-20 MG/5ML suspension 30 mL  30 mL Oral Q4H PRN Corky Sox, MD       feeding supplement (ENSURE ENLIVE / ENSURE PLUS) liquid 237 mL  237 mL Oral BID BM Nicholes Rough, NP   237 mL at 08/07/22 0805   hydrOXYzine (ATARAX) tablet 25 mg  25 mg Oral TID PRN Corky Sox, MD   25 mg at 08/06/22 0620   risperiDONE (RISPERDAL M-TABS) disintegrating tablet 2 mg  2 mg Oral Q8H PRN Lindell Spar I, NP       And   LORazepam (ATIVAN) tablet 1 mg  1 mg Oral PRN Lindell Spar I, NP       And   ziprasidone (GEODON) injection 20 mg  20 mg Intramuscular PRN Lindell Spar I, NP       magnesium hydroxide (MILK OF MAGNESIA) suspension 30 mL  30 mL Oral Daily PRN Corky Sox, MD       nicotine (NICODERM CQ - dosed in mg/24 hours) patch 14 mg  14 mg Transdermal Daily Lindell Spar I, NP   14 mg at 08/07/22 0806   risperiDONE (RISPERDAL) tablet 0.5 mg  0.5 mg Oral Daily Corky Sox, MD   0.5 mg at 08/07/22 0805   risperiDONE (RISPERDAL) tablet 1 mg  1 mg Oral QHS Corky Sox, MD   1 mg at 08/06/22 2047   traZODone (DESYREL) tablet 50 mg  50 mg Oral QHS PRN Corky Sox, MD       PTA Medications: Medications Prior to Admission  Medication Sig Dispense Refill Last Dose   risperiDONE (RISPERDAL) 0.5 MG tablet Take 1 tablet (0.5 mg total) by mouth daily.      risperiDONE (RISPERDAL) 1 MG tablet Take 1 tablet (1 mg total) by mouth at bedtime.       Patient Stressors: Occupational  concerns   Other:     Patient Strengths: Capable of independent living  Physical Health  Supportive family/friends   Treatment Modalities: Medication Management, Group therapy, Case management,  1 to 1 session with clinician, Psychoeducation, Recreational therapy.   Physician Treatment Plan for Primary Diagnosis: Schizophreniform disorder ([place]) Long Term Goal(s): Improvement in symptoms so as ready for discharge   Short Term Goals: Ability to identify and develop effective coping behaviors will improve Ability to maintain clinical measurements within normal limits will improve Compliance with prescribed medications will improve Ability to identify triggers associated with substance abuse/mental health issues will improve Ability to identify changes in lifestyle to reduce recurrence of condition will improve Ability to verbalize feelings will improve Ability to disclose and discuss suicidal ideas Ability to demonstrate self-control will improve  Medication Management: Evaluate patient's response, side effects, and tolerance of medication regimen.  Therapeutic Interventions: 1 to 1 sessions, Unit Group sessions and Medication administration.  Evaluation of Outcomes: Not Met  Physician Treatment Plan for Secondary Diagnosis: Principal Problem:   Schizophreniform disorder ([place])  Long Term Goal(s): Improvement in symptoms so as ready for discharge   Short Term Goals: Ability to identify and develop effective coping behaviors  will improve Ability to maintain clinical measurements within normal limits will improve Compliance with prescribed medications will improve Ability to identify triggers associated with substance abuse/mental health issues will improve Ability to identify changes in lifestyle to reduce recurrence of condition will improve Ability to verbalize feelings will improve Ability to disclose and discuss suicidal ideas Ability to demonstrate self-control will improve      Medication Management: Evaluate patient's response, side effects, and tolerance of medication regimen.  Therapeutic Interventions: 1 to 1 sessions, Unit Group sessions and Medication administration.  Evaluation of Outcomes: Not Met   RN Treatment Plan for Primary Diagnosis: Schizophreniform disorder ([place]) Long Term Goal(s): Knowledge of disease and therapeutic regimen to maintain health will improve  Short Term Goals: Ability to remain free from injury will improve, Ability to verbalize frustration and anger appropriately will improve, Ability to demonstrate self-control, Ability to participate in decision making will improve, Ability to verbalize feelings will improve, Ability to disclose and discuss suicidal ideas, Ability to identify and develop effective coping behaviors will improve, and Compliance with prescribed medications will improve  Medication Management: RN will administer medications as ordered by provider, will assess and evaluate patient's response and provide education to patient for prescribed medication. RN will report any adverse and/or side effects to prescribing provider.  Therapeutic Interventions: 1 on 1 counseling sessions, Psychoeducation, Medication administration, Evaluate responses to treatment, Monitor vital signs and CBGs as ordered, Perform/monitor CIWA, COWS, AIMS and Fall Risk screenings as ordered, Perform wound care treatments as ordered.  Evaluation of Outcomes: Not Met   LCSW Treatment Plan for Primary Diagnosis: Schizophreniform disorder [facility]) Long Term Goal(s): Safe transition to appropriate next level of care at discharge, Engage patient in therapeutic group addressing interpersonal concerns.  Short Term Goals: Engage patient in aftercare planning with referrals and resources, Increase social support, Increase ability to appropriately verbalize feelings, Increase emotional regulation, Facilitate acceptance of mental health diagnosis and concerns,  Facilitate patient progression through stages of change regarding substance use diagnoses and concerns, Identify triggers associated with mental health/substance abuse issues, and Increase skills for wellness and recovery  Therapeutic Interventions: Assess for all discharge needs, 1 to 1 time with Social worker, Explore available resources and support systems, Assess for adequacy in community support network, Educate family and significant other(s) on suicide prevention, Complete Psychosocial Assessment, Interpersonal group therapy.  Evaluation of Outcomes: Not Met   Progress in Treatment: Attending groups: No. Participating in groups: No. Taking medication as prescribed: Yes. Toleration medication: Yes. Family/Significant other contact made: Yes, individual(s) contacted:  Sharin Grave [phone] (Mother)  Patient understands diagnosis: No. Discussing patient identified problems/goals with staff: No. Medical problems stabilized or resolved: Yes. Denies suicidal/homicidal ideation: Yes. Issues/concerns per patient self-inventory: No.   New problem(s) identified: No, Describe:  none reported   New Short Term/Long Term Goal(s):   medication stabilization, elimination of SI thoughts, development of comprehensive mental wellness plan.    Patient Goals:  Patient states goals are to "stay more active"  Discharge Plan or Barriers: Patient recently admitted. CSW will continue to follow and assess for appropriate referrals and possible discharge planning.    Reason for Continuation of Hospitalization: Delusions  Depression Hallucinations Medication stabilization Suicidal ideation  Estimated Length of Stay: 3-7 days  Last 3 [place] Suicide Severity Risk Score: [place] Admission (Current) from 08/05/2022 in [place] [facility] ED from 08/04/2022 in [place] No Risk No Risk  Last PHQ 2/9  Scores:     No data to display          Scribe for Treatment Team: Zachery Conch, LCSW 08/07/2022 2:01 PM

## 2022-08-07 NOTE — Progress Notes (Signed)
   08/07/22 0800  Psych Admission Type (Psych Patients Only)  Admission Status Involuntary  Psychosocial Assessment  Patient Complaints None  Eye Contact Fair  Facial Expression Flat  Affect Blunted  Speech Soft;Slow;Logical/coherent  Interaction Guarded  Motor Activity Slow  Appearance/Hygiene Unremarkable  Behavior Characteristics Cooperative;Calm  Mood Depressed;Euphoric;Pleasant  Thought Process  Coherency Blocking  Content WDL  Delusions None reported or observed  Perception WDL  Hallucination None reported or observed  Judgment Impaired  Confusion None  Danger to Self  Current suicidal ideation? Denies  Self-Injurious Behavior No self-injurious ideation or behavior indicators observed or expressed   Agreement Not to Harm Self Yes  Description of Agreement verbal  Danger to Others  Danger to Others None reported or observed

## 2022-08-07 NOTE — Group Note (Signed)
LCSW Group Therapy Note   Group Date: 08/07/2022 Start Time: 1300 End Time: 1400  Type of Therapy and Topic: Group Therapy: Anger Management   Participation Level:  Did Not Attend  Description of Group: In this group, patients will learn helpful strategies and techniques to manage anger, express anger in alternative ways, change hostile attitudes, and prevent aggressive acts, such as verbal abuse and violence.This group will be process-oriented and eductional, with patients participating in exploration of their own experiences as well as giving and receiving support and challenge from other group members.  Therapeutic Goals: Patient will learn to manage anger. Patient will learn to stop violence or the threat of violence. Patient will learn to develop self control over thoughts and actions. Patient will receive support and feedback from others  Summary of Patient Progress: Did not attend    Therapeutic Modalities: Cognitive Behavioral Therapy Solution Focused Therapy Motivational Interviewing  Aram Beecham, LCSWA 08/07/2022  1:40 PM

## 2022-08-07 NOTE — Progress Notes (Signed)
     08/07/22 2026  Psych Admission Type (Psych Patients Only)  Admission Status Involuntary  Psychosocial Assessment  Patient Complaints None  Eye Contact Fair  Facial Expression Flat  Affect Blunted  Speech Soft;Slow;Logical/coherent  Interaction Guarded  Motor Activity Slow  Appearance/Hygiene Unremarkable  Behavior Characteristics Cooperative;Calm  Mood Pleasant;Depressed  Thought Process  Coherency Blocking  Content WDL  Delusions None reported or observed  Perception WDL  Hallucination None reported or observed  Judgment Impaired  Confusion None  Danger to Self  Current suicidal ideation? Denies  Self-Injurious Behavior No self-injurious ideation or behavior indicators observed or expressed   Agreement Not to Harm Self Yes  Description of Agreement verbal  Danger to Others  Danger to Others None reported or observed

## 2022-08-07 NOTE — Progress Notes (Signed)
Recreation Therapy Notes  INPATIENT RECREATION THERAPY ASSESSMENT  Patient Details Name: Sarah Solis MRN: [id number] DOB: [birth year] Today's Date: 08/07/2022       Information Obtained From: Patient  Able to Participate in Assessment/Interview: Yes  Patient Presentation: Alert  Reason for Admission (Per Patient): Other (Comments) (Depression)  Patient Stressors: Other (Comment) ("my thoughts")  Coping Skills:   Isolation, Write, Music, Meditate, Deep Breathing, Prayer, Avoidance, Read, Dance, Hot Bath/Shower, Other (Comment) (thought about self harm)  Leisure Interests (2+):  Music - Listen, Individual - Other (Comment) (Light candles and meditate)  Frequency of Recreation/Participation: Other (Comment) ("whenever I get a chance")  Awareness of Community Resources:  Yes  Community Resources:  Library, Other (Comment) (stores)  Current Use:  (Library- No; Stores- Yes)  If no, Barriers?:    Expressed Interest in [employer] Information: No  [employer] of Residence:  [place]  Patient Main Form of Transportation: Set designer  Patient Strengths:  Staying motivated, staying to self and calm  Patient Identified Areas of Improvement:  "staying on track"  Patient Goal for Hospitalization:  "managing stress"  Current SI (including self-harm):  No  Current HI:  No  Current AVH: No  Staff Intervention Plan: Group Attendance, Collaborate with Interdisciplinary Treatment Team  Consent to Intern Participation: N/A   Caroll Rancher, Richardean Sale, Pawan Knechtel A 08/07/2022, 1:21 PM

## 2022-08-07 NOTE — Progress Notes (Signed)
Adult Psychoeducational Group Note  Date:  08/07/2022 Time:  8:46 PM  Group Topic/Focus:  Wrap-Up Group:   The focus of this group is to help patients review their daily goal of treatment and discuss progress on daily workbooks.  Participation Level:  Active  Participation Quality:  Appropriate  Affect:  Appropriate  Cognitive:  Appropriate  Insight: Appropriate  Engagement in Group:  Engaged  Modes of Intervention:  Education and Exploration  Additional Comments:  Patient attended and participated in group tonight. She reports that he learn that its OK to open up to new people.  Lita Mains [facility] 08/07/2022, 8:46 PM

## 2022-08-07 NOTE — Plan of Care (Signed)
  Problem: Coping: Goal: Ability to verbalize frustrations and anger appropriately will improve Outcome: Progressing Goal: Ability to demonstrate self-control will improve Outcome: Progressing   Problem: Physical Regulation: Goal: Ability to maintain clinical measurements within normal limits will improve Outcome: Progressing   Problem: Coping: Goal: Ability to identify and develop effective coping behavior will improve Outcome: Progressing

## 2022-08-08 DIAGNOSIS — F29 Unspecified psychosis not due to a substance or known physiological condition: Secondary | ICD-10-CM | POA: Diagnosis not present

## 2022-08-08 DIAGNOSIS — F419 Anxiety disorder, unspecified: Secondary | ICD-10-CM

## 2022-08-08 DIAGNOSIS — G4700 Insomnia, unspecified: Secondary | ICD-10-CM

## 2022-08-08 DIAGNOSIS — F411 Generalized anxiety disorder: Secondary | ICD-10-CM

## 2022-08-08 LAB — URINALYSIS, COMPLETE (UACMP) WITH MICROSCOPIC
Bilirubin Urine: NEGATIVE
Glucose, UA: NEGATIVE mg/dL
Ketones, ur: NEGATIVE mg/dL
Nitrite: NEGATIVE
Protein, ur: NEGATIVE mg/dL
Specific Gravity, Urine: 1.02 (ref 1.005–1.030)
pH: 6 (ref 5.0–8.0)

## 2022-08-08 LAB — VITAMIN B12: Vitamin B-12: 396 pg/mL (ref 180–914)

## 2022-08-08 LAB — HIV ANTIBODY (ROUTINE TESTING W REFLEX): HIV Screen 4th Generation wRfx: NONREACTIVE

## 2022-08-08 MED ORDER — RISPERIDONE 1 MG PO TABS
1.0000 mg | ORAL_TABLET | Freq: Two times a day (BID) | ORAL | Status: DC
Start: 2022-08-08 — End: 2022-08-11
  Administered 2022-08-09 – 2022-08-11 (×5): 1 mg via ORAL
  Filled 2022-08-08 (×9): qty 1

## 2022-08-08 MED ORDER — RISPERIDONE 0.5 MG PO TABS
0.5000 mg | ORAL_TABLET | Freq: Once | ORAL | Status: AC
  Administered 2022-08-08: 0.5 mg via ORAL
  Filled 2022-08-08: qty 1

## 2022-08-08 MED ORDER — POLYETHYLENE GLYCOL 3350 17 G PO PACK
17.0000 g | PACK | Freq: Every day | ORAL | Status: DC
  Administered 2022-08-08 – 2022-08-11 (×3): 17 g via ORAL
  Filled 2022-08-08 (×7): qty 1

## 2022-08-08 NOTE — Progress Notes (Addendum)
[facility] MD Progress Note  08/08/2022 2:39 PM Sarah Solis  MRN:  [id number]  Reason for admission: 21 year old African-American female with no prior hx of mental health issues. Admitted to the [facility] from the [facility] under IVC petition taken out by her mother. Her chief complain includes, worsening psychosis (auditory/visual hallucinations), not eating or attending to her personal hygiene. Chart review reports also state that she was paranoid (believed that people were after her) & presenting with erratic behaviors. There may be hx of schizophrenia in the family (patient's father). Her urine drug screen was negative of all substances.  24 hr chart review: V/S for the past 24 hrs has been WNL. Pt has been compliant with all of her scheduled medications, and required Atarax 25 mg earlier today morning for anxiety. She slept for a total of 7.75 hrs last night as per nursing flow sheets. As per nursing documentation over the past 24 hrs, she has been thought blocking, anxious and guarded.  Today's patient assessment note: Pt presents during, this encounter with flat affect and depressed mood, attention to personal hygiene and grooming is fair, eye contact is good, speech is clear & coherent. Thought contents are organized and logical, and pt currently denies SI/HI/AVH. When asked questions related to paranoia, pt responds: "I don't know" but is hesitant prior to responding to question. There was no hesitancy in providing responses to other questions being asked. It is inferred that pt is continuing to present with paranoia at this time. She denies any medication related side effects, but when asked specifically when her last BM was, she states that she has not had one in 4 days. We will  add Daily Miralax to medication regimen for constipation, and we are increasing Risperdal to 1 mg BID for management of psychosis.  Pt reports that her sleep quality last night was good, and she reports a good appetite. We will continue  all other medications as listed below and we will continue to monitor.   Principal Problem: Schizophrenia spectrum disorder with psychotic disorder type not yet determined (HCC)  Diagnosis: Principal Problem:   Schizophrenia spectrum disorder with psychotic disorder type not yet determined (HCC) Active Problems:   Insomnia   Anxiety state  Total Time spent with patient: 1 hour  Past Psychiatric History: See H&P.  Past Medical History: History reviewed. No pertinent past medical history.  Past Surgical History:  Procedure Laterality Date   INCISION AND DRAINAGE PERIRECTAL ABSCESS Left 10/16/2020   Procedure: IRRIGATION AND DEBRIDEMENT PERIRECTAL ABSCESS;  Surgeon: Abigail Miyamoto, MD;  Location: [facility] OR;  Service: General;  Laterality: Left;   Family History: History reviewed. No pertinent family history.  Family Psychiatric  History: See H&P.  Social History:  Social History   Substance and Sexual Activity  Alcohol Use Not Currently     Social History   Substance and Sexual Activity  Drug Use Never    Social History   Socioeconomic History   Marital status: Single    Spouse name: Not on file   Number of children: Not on file   Years of education: Not on file   Highest education level: Not on file  Occupational History   Not on file  Tobacco Use   Smoking status: Never   Smokeless tobacco: Never  Vaping Use   Vaping Use: Some days  Substance and Sexual Activity   Alcohol use: Not Currently   Drug use: Never   Sexual activity: Not Currently  Other Topics Concern  Not on file  Social History Narrative   Not on file   Social Determinants of Health   Financial Resource Strain: Not on file  Food Insecurity: Not on file  Transportation Needs: Not on file  Physical Activity: Not on file  Stress: Not on file  Social Connections: Not on file   Additional Social History:   Sleep: Good  Appetite:   Improving.  Current Medications: Current  Facility-Administered Medications  Medication Dose Route Frequency Provider Last Rate Last Admin   acetaminophen (TYLENOL) tablet 650 mg  650 mg Oral Q6H PRN Carlyn Reichert, MD       alum & mag hydroxide-simeth (MAALOX/MYLANTA) 200-200-20 MG/5ML suspension 30 mL  30 mL Oral Q4H PRN Carlyn Reichert, MD       feeding supplement (ENSURE ENLIVE / ENSURE PLUS) liquid 237 mL  237 mL Oral BID BM Starleen Blue, NP   237 mL at 08/07/22 1808   hydrOXYzine (ATARAX) tablet 25 mg  25 mg Oral TID PRN Carlyn Reichert, MD   25 mg at 08/08/22 0815   risperiDONE (RISPERDAL M-TABS) disintegrating tablet 2 mg  2 mg Oral Q8H PRN Armandina Stammer I, NP       And   LORazepam (ATIVAN) tablet 1 mg  1 mg Oral PRN Armandina Stammer I, NP       And   ziprasidone (GEODON) injection 20 mg  20 mg Intramuscular PRN Armandina Stammer I, NP       magnesium hydroxide (MILK OF MAGNESIA) suspension 30 mL  30 mL Oral Daily PRN Carlyn Reichert, MD       mirtazapine (REMERON) tablet 7.5 mg  7.5 mg Oral QHS Nwoko, Nicole Kindred I, NP   7.5 mg at 08/07/22 2026   nicotine (NICODERM CQ - dosed in mg/24 hours) patch 14 mg  14 mg Transdermal Daily Armandina Stammer I, NP   14 mg at 08/08/22 0817   polyethylene glycol (MIRALAX / GLYCOLAX) packet 17 g  17 g Oral Daily Nkwenti, Tyler Aas, NP       risperiDONE (RISPERDAL) tablet 0.5 mg  0.5 mg Oral Once Starleen Blue, NP       risperiDONE (RISPERDAL) tablet 1 mg  1 mg Oral BID Starleen Blue, NP       traZODone (DESYREL) tablet 50 mg  50 mg Oral QHS PRN Carlyn Reichert, MD       Lab Results:  Results for orders placed or performed during the hospital encounter of 08/05/22 (from the past 48 hour(s))  HIV Antibody (routine testing w rflx)     Status: None   Collection Time: 08/08/22  6:36 AM  Result Value Ref Range   HIV Screen 4th Generation wRfx Non Reactive Non Reactive    Comment: Performed at [facility], [address], [place], [place] [address]  Vitamin B12     Status: None   Collection Time: 08/08/22   6:36 AM  Result Value Ref Range   Vitamin B-12 396 180 - 914 pg/mL    Comment: (NOTE) This assay is not validated for testing neonatal or myeloproliferative syndrome specimens for Vitamin B12 levels. Performed at [facility], [address] [place], [place] [address]     Blood Alcohol level:  Lab Results  Component Value Date   ETH <10 08/04/2022   Metabolic Disorder Labs: Lab Results  Component Value Date   HGBA1C 5.1 08/04/2022   MPG 99.67 08/04/2022   No results found for: "PROLACTIN" Lab Results  Component Value  Date   CHOL 144 08/04/2022   TRIG 30 08/04/2022   HDL 35 (L) 08/04/2022   CHOLHDL 4.1 08/04/2022   VLDL 6 08/04/2022   LDLCALC 103 (H) 08/04/2022   Physical Findings: AIMS: 0 CIWA:  n/a  COWS:  n/a   Musculoskeletal: Strength & Muscle Tone: within normal limits Gait & Station: normal Patient leans: N/A  Psychiatric Specialty Exam:  Presentation  General Appearance: Appropriate for Environment; Fairly Groomed  Eye Contact:Fair  Speech:Clear and Coherent  Speech Volume:Decreased  Handedness:Right   Mood and Affect  Mood:Depressed  Affect:flat  Thought Process  Thought Processes:Coherent  Descriptions of Associations:Intact  Orientation:Full (Time, Place and Person)  Thought Content:concerns for paranoia  History of Schizophrenia/Schizoaffective disorder:No  Duration of Psychotic Symptoms:Less than six months  Hallucinations:Hallucinations: None   Ideas of Reference:Paranoia  Suicidal Thoughts:Suicidal Thoughts: No   Homicidal Thoughts:Homicidal Thoughts: No   Sensorium  Memory:Immediate Good  Judgment:Fair  Insight:Fair  Executive Functions  Concentration:Good  Attention Span:Fair  Recall:Fair  Fund of Knowledge:Fair  Language:Fair  Psychomotor Activity  Psychomotor Activity:Psychomotor Activity: Normal   Assets  Assets:Communication Skills; Social Support; Housing  Sleep   Sleep:Sleep: Good   Physical Exam: Physical Exam Vitals and nursing note reviewed.  HENT:     Nose: Nose normal.     Mouth/Throat:     Pharynx: Oropharynx is clear.  Eyes:     Pupils: Pupils are equal, round, and reactive to light.  Cardiovascular:     Rate and Rhythm: Normal rate.     Pulses: Normal pulses.  Pulmonary:     Effort: Pulmonary effort is normal.  Genitourinary:    Comments: Deferred Musculoskeletal:        General: Normal range of motion.     Cervical back: Normal range of motion.  Skin:    General: Skin is warm and dry.  Neurological:     General: No focal deficit present.     Mental Status: She is alert and oriented to person, place, and time.    Review of Systems  Constitutional:  Negative for chills and fever.  HENT:  Negative for congestion and sore throat.   Respiratory:  Negative for cough, shortness of breath and wheezing.   Cardiovascular:  Negative for chest pain and palpitations.  Gastrointestinal:  Negative for abdominal pain, constipation, diarrhea, heartburn, nausea and vomiting.  Genitourinary:  Negative for dysuria.  Musculoskeletal:  Negative for joint pain and myalgias.  Skin: Negative.   Neurological:  Negative for dizziness, tingling, tremors, sensory change, speech change, focal weakness, seizures, loss of consciousness, weakness and headaches.  Endo/Heme/Allergies:        Allergies: NKDA  Psychiatric/Behavioral:  Positive for depression and hallucinations (Reports feeling paranoid.). Negative for memory loss, substance abuse and suicidal ideas. The patient is nervous/anxious. The patient does not have insomnia.    Blood pressure 106/68, pulse 94, temperature 97.6 F (36.4 C), temperature source Oral, resp. rate 16, height 5\' 5"  (1.651 m), weight 59.9 kg, SpO2 100 %. Body mass index is 21.97 kg/m.  Treatment Plan Summary: Daily contact with patient to assess and evaluate symptoms and progress in treatment and Medication management.    Continue inpatient hospitalization.  Will continue today 08/08/2022 plan as below except where it is noted.   PLAN Safety and Monitoring: Voluntary admission to inpatient psychiatric unit for safety, stabilization and treatment Daily contact with patient to assess and evaluate symptoms and progress in treatment Patient's case discussed in multi-disciplinary team meeting Observation Level : q15  minute checks Vital signs: q12 hours Precautions: Safety   Long Term Goal(s): Improvement in symptoms so as ready for discharge   Diagnoses Principal Problem:   Schizophrenia spectrum disorder with psychotic disorder type not yet determined [facility]) Active Problems:   Insomnia   Anxiety state   Medications: We are adjusting medications today, 08/08/22 as below: -Increase Risperdal to 1 mg BID for psychosis/mood control.-Educated on rationales, benefits & possible side effects of medication. TSH, lipid panel & hemoglobin A1C WNL. -Continue Trazodone 50 mg nightly prn for insomnia.  -Continue Mirtazapine 7.5 mg po daily for depression/insomnia. -Continue Hydroxyzine 25 mg po tid prn for anxiety.  -Restarted Nicorette gum 2 mg prn for nicotine withdrawal.  -Initiated Nicotine patch 14 mg trans-dermally Q 24 hours for nicotine withdrawal management. -Start Miralax daily for constipation  Agitation/psychosis protocols.  -Risperdal M-tabs 2 mg po Q 8 hours prn.  &  -Lorazepam 1 mg po prn x 1 dose.  & Geodon 20 mg IM prn x 1 dose.   Other PRNS -Continue Tylenol 650 mg every 6 hours PRN for mild pain -Continue Maalox 30 ml Q 4 hrs PRN for indigestion -Continue MOM 30 ml po Q 6 hrs for constipation  Labs reviewed: K-3.3, will repeat CMP in the morning. TSH, Hemoglobin A1C WNL, CBC WNL, Lipid panel WNL. Urine pregnancy test negative. UA pending, toxicology screen negative. HIV NR, B12 WNL, EKG with QTC-450. First time psychosis labs pending (ANA, Ceruloplasmin, RPR, heavy metal, Vitamin B1, Vitamin  D pending) Head CT noncontrast pending   Discharge Planning: Social work and case management to assist with discharge planning and identification of hospital follow-up needs prior to discharge Estimated LOS: 5-7 days Discharge Concerns: Need to establish a safety plan; Medication compliance and effectiveness Discharge Goals: Return home with outpatient referrals for mental health follow-up including medication management/psychotherapy   Starleen Blue, NP, pmhnp 08/08/2022, 2:39 PMPatient ID: Sarah Solis, female   DOB: 2001-03-11, 21 y.o.   MRN: [id number]

## 2022-08-08 NOTE — Progress Notes (Signed)
   08/08/22 2109  Psychosocial Assessment  Patient Complaints Anxiety  Eye Contact Fair  Facial Expression Flat  Affect Blunted  Speech Soft;Slow;Logical/coherent  Interaction Forwards little;Guarded  Motor Activity Other (Comment) (unremarkable)  Appearance/Hygiene Unremarkable  Behavior Characteristics Cooperative;Calm;Guarded  Mood Pleasant  Thought Process  Coherency Blocking  Content WDL  Delusions None reported or observed  Perception WDL  Hallucination None reported or observed  Judgment Impaired  Confusion None  Danger to Self  Current suicidal ideation? Denies  Danger to Others  Danger to Others None reported or observed

## 2022-08-08 NOTE — Progress Notes (Signed)
Adult Psychoeducational Group Note  Date:  08/08/2022 Time:  8:10 PM  Group Topic/Focus:  Wrap-Up Group:   The focus of this group is to help patients review their daily goal of treatment and discuss progress on daily workbooks.  Participation Level:  Did Not Attend  Participation Quality:  Did Not Attend  Affect:  Did Not Attend  Cognitive:  Did Not Attend  Insight: Did Not Attend  Engagement in Group:  Did Not Attend  Modes of Intervention:  Did Not Attend  Additional Comments:   Pt was encouraged to attend group discussion but refused   Vevelyn Pat 08/08/2022, 8:10 PM

## 2022-08-08 NOTE — Group Note (Signed)
  [facility] LCSW Group Therapy Note  Date/Time:  08/08/2022   Type of Therapy and Topic:  Group Therapy:  Feelings About Hospitalization  Participation Level:  Did Not Attend   Description of Group This process group involved patients discussing their feelings related to being hospitalized, as well as the benefits they see to being in the hospital.  These feelings and benefits were itemized.  The group then brainstormed specific ways in which they could seek those same benefits when they discharge and return home.  Therapeutic Goals Patient will identify and describe positive and negative feelings related to hospitalization Patient will verbalize benefits of hospitalization to themselves personally Patients will brainstorm together ways they can obtain similar benefits in the outpatient setting, identify barriers to wellness and possible solutions  Summary of Patient Progress:  The patient did not attend this group.  Therapeutic Modalities Cognitive Behavioral Therapy Motivational Interviewing    [place], [place] 08/08/2022, 10:31 AM

## 2022-08-08 NOTE — Progress Notes (Signed)
   08/08/22 1100  Psych Admission Type (Psych Patients Only)  Admission Status Involuntary  Psychosocial Assessment  Patient Complaints Anxiety  Eye Contact Fair  Facial Expression Flat  Affect Blunted  Speech Soft;Slow;Logical/coherent  Interaction Guarded  Motor Activity Slow  Appearance/Hygiene Unremarkable  Behavior Characteristics Cooperative;Calm  Mood Pleasant;Depressed  Thought Process  Coherency Blocking  Content WDL  Delusions None reported or observed  Perception WDL  Hallucination None reported or observed  Judgment Impaired  Confusion None  Danger to Self  Current suicidal ideation? Denies  Self-Injurious Behavior No self-injurious ideation or behavior indicators observed or expressed   Agreement Not to Harm Self Yes  Description of Agreement Verbal  Danger to Others  Danger to Others None reported or observed

## 2022-08-09 ENCOUNTER — Inpatient Hospital Stay (HOSPITAL_COMMUNITY): Payer: Self-pay

## 2022-08-09 ENCOUNTER — Ambulatory Visit (HOSPITAL_COMMUNITY)
Admission: RE | Admit: 2022-08-09 | Discharge: 2022-08-09 | Disposition: A | Discharge status: Home / Self Care | Payer: Self-pay | Source: Other Acute Inpatient Hospital | Attending: Emergency Medicine | Admitting: Emergency Medicine

## 2022-08-09 DIAGNOSIS — F29 Unspecified psychosis not due to a substance or known physiological condition: Secondary | ICD-10-CM | POA: Diagnosis not present

## 2022-08-09 LAB — COMPREHENSIVE METABOLIC PANEL
ALT: 11 U/L (ref 0–44)
AST: 13 U/L — ABNORMAL LOW (ref 15–41)
Albumin: 3.9 g/dL (ref 3.5–5.0)
Alkaline Phosphatase: 36 U/L — ABNORMAL LOW (ref 38–126)
Anion gap: 5 (ref 5–15)
BUN: 12 mg/dL (ref 6–20)
CO2: 27 mmol/L (ref 22–32)
Calcium: 9.1 mg/dL (ref 8.9–10.3)
Chloride: 110 mmol/L (ref 98–111)
Creatinine, Ser: 0.59 mg/dL (ref 0.44–1.00)
GFR, Estimated: >60 mL/min (ref >60)
Glucose, Bld: 88 mg/dL (ref 70–99)
Potassium: 4 mmol/L (ref 3.5–5.1)
Sodium: 142 mmol/L (ref 135–145)
Total Bilirubin: 0.3 mg/dL (ref 0.3–1.2)
Total Protein: 6.7 g/dL (ref 6.5–8.1)

## 2022-08-09 LAB — RPR: RPR Ser Ql: NONREACTIVE

## 2022-08-09 LAB — VITAMIN D 25 HYDROXY (VIT D DEFICIENCY, FRACTURES): Vit D, 25-Hydroxy: 12.89 ng/mL — ABNORMAL LOW (ref 30–100)

## 2022-08-09 MED ORDER — MIRTAZAPINE 15 MG PO TABS
15.0000 mg | ORAL_TABLET | Freq: Every day | ORAL | Status: DC
  Administered 2022-08-09 – 2022-08-12 (×4): 15 mg via ORAL
  Filled 2022-08-09 (×6): qty 1

## 2022-08-09 MED ORDER — VITAMIN D (ERGOCALCIFEROL) 1.25 MG (50000 UNIT) PO CAPS
50000.0000 [IU] | ORAL_CAPSULE | ORAL | Status: DC
  Administered 2022-08-09: 50000 [IU] via ORAL
  Filled 2022-08-09: qty 1

## 2022-08-09 MED ORDER — MAGNESIUM CITRATE PO SOLN
1.0000 | Freq: Once | ORAL | Status: AC
  Administered 2022-08-09: 1 via ORAL
  Filled 2022-08-09: qty 296

## 2022-08-09 MED ORDER — BENZTROPINE MESYLATE 0.5 MG PO TABS: 0.5000 mg | ORAL_TABLET | Freq: Two times a day (BID) | ORAL | Status: DC | PRN

## 2022-08-09 NOTE — Progress Notes (Signed)
   08/09/22 1000  Psych Admission Type (Psych Patients Only)  Admission Status Involuntary  Psychosocial Assessment  Patient Complaints Anxiety  Eye Contact Fair  Facial Expression Flat  Affect Blunted  Speech Soft;Slow;Logical/coherent  Interaction Forwards little;Guarded  Motor Activity Slow  Appearance/Hygiene Unremarkable  Behavior Characteristics Cooperative;Calm;Guarded  Mood Depressed;Pleasant  Thought Process  Coherency Blocking  Content WDL  Delusions None reported or observed  Perception WDL  Hallucination None reported or observed  Judgment Impaired  Confusion None  Danger to Self  Current suicidal ideation? Denies  Self-Injurious Behavior No self-injurious ideation or behavior indicators observed or expressed   Agreement Not to Harm Self Yes  Description of Agreement Verbal  Danger to Others  Danger to Others None reported or observed

## 2022-08-09 NOTE — [facility] Group Notes (Signed)
Adult Psychoeducational Group Note  Date:  08/09/2022 Time:  4:45 PM  Group Topic/Focus:  Goals Group:   The focus of this group is to help patients establish daily goals to achieve during treatment and discuss how the patient can incorporate goal setting into their daily lives to aide in recovery.  Participation Level:  Active  Participation Quality:  Attentive  Affect:  Appropriate  Cognitive:  Appropriate  Insight: Appropriate  Engagement in Group:  Engaged  Modes of Intervention:  Discussion  Additional Comments:   Patient attended goal group and was attentive the duration of it. Patient's goal was to attend more groups.   Willies Laviolette T Ciel Chervenak 08/09/2022, 4:45 PM

## 2022-08-09 NOTE — Progress Notes (Addendum)
[facility] MD Progress Note  08/09/2022 1:28 PM Sarah Solis  MRN:  [id number]  Reason for admission: 21 year old African-American female with no prior hx of mental health issues. Admitted to the [facility] from the [facility] under IVC petition taken out by her mother. Her chief complain includes, worsening psychosis (auditory/visual hallucinations), not eating or attending to her personal hygiene. Chart review reports also state that she was paranoid (believed that people were after her) & presenting with erratic behaviors. There may be hx of schizophrenia in the family (patient's father). Her urine drug screen was negative of all substances.  24 hr chart review: V/S for the past 24 hrs have been WNL with the exception of one isolated episode of a low BP yesterday afternoon. Pt has continued to be compliant with all of her scheduled medications, and has not required any anti anxiety medications or sleep aides for the past 24 hrs. She slept for a total of 7.75 hrs last night as per nursing flow sheets. She has attended some group sessions in the past 24 hrs.  Today's patient assessment note: Pt seen in her room on the 400 hall for this encounter. She presents with a flat affect and depressed mood, but occasional smiles during encounter. Her attention to personal hygiene and grooming is fair, eye contact is good, speech is clear & coherent. Thought contents are organized and logical, and pt currently denies SI/HI/AVH. Pt continues to be hesitant when questions regarding paranoia are asked. She is asked today if she feels like there are people out to hurt her. She states: "They  better not be!", then states that she does not think so.   She reports that she is tolerating her medications well, denies any medication related side effects, but continues to state that she has not had a bowel movement for 4 days, but states that this is typical for her. We have ordered Miralax for constipation which pt has taken, with no results.  Will order Mg Citrate x one dose for constipation. Pt denies abdominal pain or discomfort, and states that she is passing gas. No signs/symptoms of a bowel obstruction. She is tolerating taking the Risperdal 1 mg BID for psychosis. No TD/EPS type symptoms found on assessment, and pt denies any feelings of stiffness. AIMS: 0. We will continue this medication. Psychosis is improving, but pt is reporting depressive symptoms. She states that prior to this admission, she was not eating, not wanting to be around other people, and also not enjoying the things that she typically enjoys. She reports still feeling this way. We are increasing Remeron to 15 mg nightly for management of sleep and MDD. We are continuing other medications as listed below.  Pt reports that her sleep quality last night was good, and she reports a good appetite.   Principal Problem: Schizophrenia spectrum disorder with psychotic disorder type not yet determined (HCC)  Diagnosis: Principal Problem:   Schizophrenia spectrum disorder with psychotic disorder type not yet determined (HCC) Active Problems:   Insomnia   Anxiety state  Total Time spent with patient: 1 hour  Past Psychiatric History: See H&P.  Past Medical History: History reviewed. No pertinent past medical history.  Past Surgical History:  Procedure Laterality Date   INCISION AND DRAINAGE PERIRECTAL ABSCESS Left 10/16/2020   Procedure: IRRIGATION AND DEBRIDEMENT PERIRECTAL ABSCESS;  Surgeon: Abigail Miyamoto, MD;  [facility] OR;  Service: General;  Laterality: Left;   Family History: History reviewed. No pertinent family history.  Family Psychiatric  History: See H&P.  Social History:  Social History   Substance and Sexual Activity  Alcohol Use Not Currently     Social History   Substance and Sexual Activity  Drug Use Never    Social History   Socioeconomic History   Marital status: Single    Spouse name: Not on file   Number of children: Not on  file   Years of education: Not on file   Highest education level: Not on file  Occupational History   Not on file  Tobacco Use   Smoking status: Never   Smokeless tobacco: Never  Vaping Use   Vaping Use: Some days  Substance and Sexual Activity   Alcohol use: Not Currently   Drug use: Never   Sexual activity: Not Currently  Other Topics Concern   Not on file  Social History Narrative   Not on file   Social Determinants of Health   Financial Resource Strain: Not on file  Food Insecurity: Not on file  Transportation Needs: Not on file  Physical Activity: Not on file  Stress: Not on file  Social Connections: Not on file   Additional Social History:   Sleep: Good  Appetite:   Improving.  Current Medications: Current Facility-Administered Medications  Medication Dose Route Frequency Provider Last Rate Last Admin   acetaminophen (TYLENOL) tablet 650 mg  650 mg Oral Q6H PRN Carlyn Reichert, MD       alum & mag hydroxide-simeth (MAALOX/MYLANTA) 200-200-20 MG/5ML suspension 30 mL  30 mL Oral Q4H PRN Carlyn Reichert, MD       feeding supplement (ENSURE ENLIVE / ENSURE PLUS) liquid 237 mL  237 mL Oral BID BM Starleen Blue, NP   237 mL at 08/09/22 0934   hydrOXYzine (ATARAX) tablet 25 mg  25 mg Oral TID PRN Carlyn Reichert, MD   25 mg at 08/08/22 0815   risperiDONE (RISPERDAL M-TABS) disintegrating tablet 2 mg  2 mg Oral Q8H PRN Armandina Stammer I, NP       And   LORazepam (ATIVAN) tablet 1 mg  1 mg Oral PRN Armandina Stammer I, NP       And   ziprasidone (GEODON) injection 20 mg  20 mg Intramuscular PRN Armandina Stammer I, NP       magnesium citrate solution 1 Bottle  1 Bottle Oral Once Starleen Blue, NP       magnesium hydroxide (MILK OF MAGNESIA) suspension 30 mL  30 mL Oral Daily PRN Carlyn Reichert, MD       mirtazapine (REMERON) tablet 15 mg  15 mg Oral QHS Nkwenti, Doris, NP       nicotine (NICODERM CQ - dosed in mg/24 hours) patch 14 mg  14 mg Transdermal Daily Armandina Stammer I, NP    14 mg at 08/09/22 0747   polyethylene glycol (MIRALAX / GLYCOLAX) packet 17 g  17 g Oral Daily Starleen Blue, NP   17 g at 08/09/22 0744   risperiDONE (RISPERDAL) tablet 1 mg  1 mg Oral BID Starleen Blue, NP   1 mg at 08/09/22 0743   traZODone (DESYREL) tablet 50 mg  50 mg Oral QHS PRN Carlyn Reichert, MD       Vitamin D (Ergocalciferol) (DRISDOL) 1.25 MG (50000 UNIT) capsule 50,000 Units  50,000 Units Oral Q7 days Starleen Blue, NP       Lab Results:  Results for orders placed or performed during the hospital encounter of 08/05/22 (from the past 48 hour(s))  RPR  Status: None   Collection Time: 08/08/22  6:36 AM  Result Value Ref Range   RPR Ser Ql NON REACTIVE NON REACTIVE    Comment: Performed at [facility], [address] [place], [place] [address]  HIV Antibody (routine testing w rflx)     Status: None   Collection Time: 08/08/22  6:36 AM  Result Value Ref Range   HIV Screen 4th Generation wRfx Non Reactive Non Reactive    Comment: Performed at [facility], [address] [place], [place] [address]  Vitamin B12     Status: None   Collection Time: 08/08/22  6:36 AM  Result Value Ref Range   Vitamin B-12 396 180 - 914 pg/mL    Comment: (NOTE) This assay is not validated for testing neonatal or myeloproliferative syndrome specimens for Vitamin B12 levels. Performed at [facility], [address] [place], [place] [address]   Urinalysis, Complete w Microscopic     Status: Abnormal   Collection Time: 08/08/22  7:58 PM  Result Value Ref Range   Color, Urine YELLOW YELLOW   APPearance HAZY (A) CLEAR   Specific Gravity, Urine 1.020 1.005 - 1.030   pH 6.0 5.0 - 8.0   Glucose, UA NEGATIVE NEGATIVE mg/dL   Hgb urine dipstick MODERATE (A) NEGATIVE   Bilirubin Urine NEGATIVE NEGATIVE   Ketones, ur NEGATIVE NEGATIVE mg/dL   Protein, ur NEGATIVE NEGATIVE mg/dL   Nitrite NEGATIVE NEGATIVE   Leukocytes,Ua TRACE (A) NEGATIVE   RBC / HPF 0-5 0 -  5 RBC/hpf   WBC, UA 0-5 0 - 5 WBC/hpf   Bacteria, UA RARE (A) NONE SEEN   Squamous Epithelial / LPF 0-5 0 - 5   Mucus PRESENT     Comment: Performed at [facility], [address] [place], [place] [address]  VITAMIN D 25 Hydroxy (Vit-D Deficiency, Fractures)     Status: Abnormal   Collection Time: 08/09/22  6:40 AM  Result Value Ref Range   Vit D, 25-Hydroxy 12.89 (L) 30 - 100 ng/mL    Comment: (NOTE) Vitamin D deficiency has been defined by the Institute of Medicine  and an Endocrine Society practice guideline as a level of serum 25-OH  vitamin D less than 20 ng/mL (1,2). The Endocrine Society went on to  further define vitamin D insufficiency as a level between 21 and 29  ng/mL (2).  1. IOM (Institute of Medicine). 2010. Dietary reference intakes for  calcium and D. [place]: The [employer] Academ[employer]. 2. Holick MF, Binkley [place], Bischoff-Ferrari HA, et al. Evaluation,  treatment, and prevention of vitamin D deficiency: an Endocrine  Society clinical practice guideline, JCEM. 2011 Jul; 96(7): 1911-30.  Performed at [facility], [address] [place], [place] [address]   Comprehensive metabolic panel     Status: Abnormal   Collection Time: 08/09/22  6:40 AM  Result Value Ref Range   Sodium 142 135 - 145 mmol/L   Potassium 4.0 3.5 - 5.1 mmol/L   Chloride 110 98 - 111 mmol/L   CO2 27 22 - 32 mmol/L   Glucose, Bld 88 70 - 99 mg/dL    Comment: Glucose reference range applies only to samples taken after fasting for at least 8 hours.   BUN 12 6 - 20 mg/dL   Creatinine, Ser 6.570.59 0.44 - 1.00 mg/dL   Calcium 9.1 8.9 - 84.610.3 mg/dL   Total Protein 6.7 6.5 - 8.1 g/dL  Albumin 3.9 3.5 - 5.0 g/dL   AST 13 (L) 15 - 41 U/L   ALT 11 0 - 44 U/L   Alkaline Phosphatase 36 (L) 38 - 126 U/L   Total Bilirubin 0.3 0.3 - 1.2 mg/dL   GFR, Estimated >67 >67 mL/min    Comment: (NOTE) Calculated using the CKD-EPI Creatinine Equation (2021)    Anion gap 5 5 - 15     Comment: Performed at [facility], [address] [place], [place] [address]    Blood Alcohol level:  Lab Results  Component Value Date   ETH <10 08/04/2022   Metabolic Disorder Labs: Lab Results  Component Value Date   HGBA1C 5.1 08/04/2022   MPG 99.67 08/04/2022   No results found for: "PROLACTIN" Lab Results  Component Value Date   CHOL 144 08/04/2022   TRIG 30 08/04/2022   HDL 35 (L) 08/04/2022   CHOLHDL 4.1 08/04/2022   VLDL 6 08/04/2022   LDLCALC 103 (H) 08/04/2022   Physical Findings: AIMS: 0 CIWA:  n/a  COWS:  n/a   Musculoskeletal: Strength & Muscle Tone: within normal limits Gait & Station: normal Patient leans: N/A  Psychiatric Specialty Exam:  Presentation  General Appearance: Appropriate for Environment; Fairly Groomed  Eye Contact:Good  Speech:Clear and Coherent  Speech Volume:Normal  Handedness:Right   Mood and Affect  Mood:Depressed  Affect:flat  Thought Process  Thought Processes:Coherent  Descriptions of Associations:Intact  Orientation:Full (Time, Place and Person)  Thought Content:concerns for paranoia with some speech hestiancy  History of Schizophrenia/Schizoaffective disorder:No  Duration of Psychotic Symptoms:Less than six months  Hallucinations:Hallucinations: None   Ideas of Reference:None  Suicidal Thoughts:Suicidal Thoughts: No   Homicidal Thoughts:Homicidal Thoughts: No   Sensorium  Memory:Immediate Good  Judgment:Fair  Insight:Fair  Executive Functions  Concentration:Good  Attention Span:Good  Recall:Good  Fund of Knowledge:Good  Language:Good  Psychomotor Activity  Psychomotor Activity:Psychomotor Activity: Normal   Assets  Assets:Communication Skills; Housing; Social Support  Sleep  Sleep:Sleep: Good   Physical Exam: Physical Exam Vitals and nursing note reviewed.  HENT:     Nose: Nose normal.     Mouth/Throat:     Pharynx: Oropharynx is clear.   Eyes:     Pupils: Pupils are equal, round, and reactive to light.  Cardiovascular:     Rate and Rhythm: Normal rate.     Pulses: Normal pulses.  Pulmonary:     Effort: Pulmonary effort is normal.  Genitourinary:    Comments: Deferred Musculoskeletal:        General: Normal range of motion.     Cervical back: Normal range of motion.  Skin:    General: Skin is warm and dry.  Neurological:     General: No focal deficit present.     Mental Status: She is alert and oriented to person, place, and time.    Review of Systems  Constitutional:  Negative for chills and fever.  HENT:  Negative for congestion and sore throat.   Respiratory:  Negative for cough, shortness of breath and wheezing.   Cardiovascular:  Negative for chest pain and palpitations.  Gastrointestinal:  Negative for abdominal pain, constipation, diarrhea, heartburn, nausea and vomiting.  Genitourinary:  Negative for dysuria.  Musculoskeletal:  Negative for joint pain and myalgias.  Skin: Negative.   Neurological:  Negative for dizziness, tingling, tremors, sensory change, speech change, focal weakness, seizures, loss of consciousness, weakness and headaches.  Endo/Heme/Allergies:        Allergies: NKDA  Psychiatric/Behavioral:  Positive for depression. Negative for hallucinations (Reports feeling paranoid.), memory loss, substance abuse and suicidal ideas. The patient is not nervous/anxious and does not have insomnia.    Blood pressure 104/69, pulse 85, temperature 97.6 F (36.4 C), temperature source Oral, resp. rate 16, height 5\' 5"  (1.651 m), weight 59.9 kg, SpO2 100 %. Body mass index is 21.97 kg/m.  Treatment Plan Summary: Daily contact with patient to assess and evaluate symptoms and progress in treatment and Medication management.   Continue inpatient hospitalization.  Will continue today 08/09/2022 plan as below except where it is noted.   PLAN Safety and Monitoring: Voluntary admission to inpatient  psychiatric unit for safety, stabilization and treatment Daily contact with patient to assess and evaluate symptoms and progress in treatment Patient's case discussed in multi-disciplinary team meeting Observation Level : q15 minute checks Vital signs: q12 hours Precautions: Safety   Long Term Goal(s): Improvement in symptoms so as ready for discharge   Diagnoses Principal Problem: Unspecified schizophrenia spectrum and other psychotic d/o  (r/o MDD with psychotic features, r/o schizophreniform d/o) Active Problems:   Insomnia   Anxiety state   Medications: We are adjusting medications today, 08/09/22 as below: -Continue Risperdal to 1 mg BID for psychosis/mood control.-Educated on rationales, benefits & possible side effects of medication. TSH, lipid panel & hemoglobin A1C WNL. EKG with QTC-450.  -Continue Trazodone 50 mg nightly prn for insomnia.   -Increase Mirtazapine to 15mg  po daily for depression/insomnia.  -Continue Hydroxyzine 25 mg po tid prn for anxiety.   -Continue Nicotine patch 14 mg trans-dermally Q 24 hours for nicotine withdrawal management.  -Continue Miralax daily for constipation  -Give Mg Citrate x 1 dose for constipation  -Start Vitamin D 50,000 units weekly for Vitamin D deficiency  Agitation/psychosis protocols.  -Risperdal M-tabs 2 mg po Q 8 hours prn.  &  -Lorazepam 1 mg po prn x 1 dose.  & Geodon 20 mg IM prn x 1 dose.   Other PRNS -Continue Tylenol 650 mg every 6 hours PRN for mild pain -Continue Maalox 30 ml Q 4 hrs PRN for indigestion -Continue MOM 30 ml po Q 6 hrs for constipation  Labs reviewed: K-3.3 on admission and repeated 9/3 WNL at 4. TSH, Hemoglobin A1C WNL, CBC WNL, Lipid panel WNL. Urine pregnancy test negative. UA with hazy urine, moderate blood, trace leukocytes and rare bacteria; Pt reports that she is currently on her menstrual period, and denies any signs/symptoms of a UTI.  Toxicology screen negative. HIV NR, B12 WNL, EKG with  QTC-450. First time psychosis labs pending (ANA, Ceruloplasmin, RPR, heavy metal, Vitamin B1,  pending) Vitamin D low at 12.89. Will supplement with Vitamin D 50,000 units weekly. Head CT noncontrast pending   Discharge Planning: Social work and case management to assist with discharge planning and identification of hospital follow-up needs prior to discharge Estimated LOS: 5-7 days Discharge Concerns: Need to establish a safety plan; Medication compliance and effectiveness Discharge Goals: Return home with outpatient referrals for mental health follow-up including medication management/psychotherapy   , NP, pmhnp 08/09/2022, 1:28 PMPatient ID: Starleen Blue, female   DOB: March 29, 2001, 21 y.o.   MRN: Nelwyn Salisbury Patient ID: Ronne Savoia, female   DOB: 17-Jul-2001, 21 y.o.   MRN: [id number]

## 2022-08-09 NOTE — Group Note (Signed)
[facility] LCSW Group Therapy Note  Date/Time:  08/09/2022   Type of Therapy and Topic:  Group Therapy:  Healthy and Unhealthy Supports  Participation Level:  Minimal   Description of Group:  Patients in this group were introduced to the idea of adding a variety of healthy supports to address the various needs in their lives.  Patients discussed what additional healthy supports could be helpful in their recovery and wellness after discharge in order to prevent future hospitalizations such as counselor, doctor, other levels of psychiatric care such as ACTT services, therapy groups, 12-step groups, and problem-specific support groups.  A demonstration was given about how to set boundaries which patients expressed was beneficial.    Therapeutic Goals:   1)  discuss importance of adding supports to stay well once out of the hospital  2)  compare healthy versus unhealthy supports and identify some examples of each  3)  generate ideas and descriptions of healthy supports that can be added  4)  offer mutual support about how to address unhealthy supports  5)  encourage active participation in and adherence to discharge plan    Summary of Patient Progress:  The patient stated that current healthy supports in her life are several family members that provide helpful input.  The patient had to leave group early due to medical needs.   Therapeutic Modalities:   Motivational Interviewing Brief Solution-Focused Therapy  [facility] [place], [address]

## 2022-08-09 NOTE — Progress Notes (Signed)
The focus of this group is to help patients review their daily goal of treatment and discuss progress on daily workbooks.  Pt attended the evening group and responded to all discussion prompts from the Writer. Pt shared that today was a good day on the unit, the highlight of which was waking up in a good mood. "I don't know why it happened, but it lasted all day."  Pt told that their goal this coming week was to continue to work on her anxiety, particularly as it relates to being around people and not isolating in her room.  Pt rated her day a 9 out of 10 and her affect was appropriate.

## 2022-08-10 DIAGNOSIS — F29 Unspecified psychosis not due to a substance or known physiological condition: Secondary | ICD-10-CM | POA: Diagnosis not present

## 2022-08-10 LAB — RPR: RPR Ser Ql: NONREACTIVE

## 2022-08-10 NOTE — Progress Notes (Signed)
Pt in room most of the day asleep. Isolative. Pt declined miralax stating she has had a bowel movement. Pt denies a/v hallucinations as well as SI/HI/se;f harm thoughts, pt noted to be slow to respond at times with potential thought blocking noted. Q 15 minute checks ongoing

## 2022-08-10 NOTE — Progress Notes (Signed)
   08/10/22 2100  Psych Admission Type (Psych Patients Only)  Admission Status Involuntary  Psychosocial Assessment  Patient Complaints Anxiety;Depression  Eye Contact Fair  Facial Expression Flat  Affect Appropriate to circumstance  Speech Logical/coherent;Soft  Interaction Forwards little  Motor Activity Slow  Appearance/Hygiene Unremarkable  Behavior Characteristics Cooperative  Mood Depressed  Thought Process  Coherency WDL  Content WDL  Delusions None reported or observed  Perception WDL  Hallucination None reported or observed  Judgment Poor  Confusion None  Danger to Self  Current suicidal ideation? Denies  Self-Injurious Behavior No self-injurious ideation or behavior indicators observed or expressed   Agreement Not to Harm Self Yes  Description of Agreement verbal  Danger to Others  Danger to Others None reported or observed

## 2022-08-10 NOTE — Progress Notes (Signed)
Adult Psychoeducational Group Note  Date:  08/10/2022 Time:  6:53 PM  Pt. Did not attend the goals group.

## 2022-08-10 NOTE — Progress Notes (Signed)
[facility] MD Progress Note  08/10/2022 1:08 PM Sarah Solis  MRN:  [id number]  Reason for admission: 21 year old African-American female with no prior hx of mental health issues. Admitted to the [facility] from the [facility] under IVC petition taken out by her mother. Her chief complain includes, worsening psychosis (auditory/visual hallucinations), not eating or attending to her personal hygiene. Chart review reports also state that she was paranoid (believed that people were after her) & presenting with erratic behaviors. There may be hx of schizophrenia in the family (patient's father). Her urine drug screen was negative of all substances.  24 hr chart review: Pt's V/S for the past 24 hrs have been WNL. Pt has continued to be compliant with all of her scheduled medications, and has last required anti anxiety medications (Hydroxyzine 25 mg) yesterday night. She was also given Trazodone 50 mg last night for insomnia. She slept for a total of 8.25 hrs last night as per nursing flow sheets. She has attended some group sessions in the past 24 hrs, and has been attentive and has actively participated.  Today's patient assessment note: Pt seen in her room on the 400 hall for this encounter. She continues to present with a flat affect and depressed mood. Her attention to personal hygiene and grooming is fair, eye contact is good, speech is clear & coherent. Thought contents are organized and logical, and pt currently denies SI/HI/AVH. She denies feelings of paranoia, and there is no evidence of delusional thinking.   Pt reports a good sleep quality last night, reports a good appetite, and denies being in any physical discomfort. She complained of being "too tired" earlier this morning, and stated that medications might be causing her to be too tired in the mornings. In reviewing the [facility], Trazodone 50 mg, Hydroxyzine 25 mg along with Risperdal 1 mg, and Remeron 15 mg were given to her last night. We will discontinue the Atarax 25  mg and the Trazodone 50 mg as all of the medications above might be causing the daytime grogginess that she is currently experiencing. We will continue other medications as listed below.  Principal Problem: Schizophrenia spectrum disorder with psychotic disorder type not yet determined (HCC)  Diagnosis: Principal Problem:   Schizophrenia spectrum disorder with psychotic disorder type not yet determined (HCC) Active Problems:   Insomnia   Anxiety state  Total Time spent with patient: 1 hour  Past Psychiatric History: See H&P.  Past Medical History: History reviewed. No pertinent past medical history.  Past Surgical History:  Procedure Laterality Date   INCISION AND DRAINAGE PERIRECTAL ABSCESS Left 10/16/2020   Procedure: IRRIGATION AND DEBRIDEMENT PERIRECTAL ABSCESS;  Surgeon: Abigail Miyamoto, MD;  Location: [facility] OR;  Service: General;  Laterality: Left;   Family History: History reviewed. No pertinent family history.  Family Psychiatric  History: See H&P.  Social History:  Social History   Substance and Sexual Activity  Alcohol Use Not Currently     Social History   Substance and Sexual Activity  Drug Use Never    Social History   Socioeconomic History   Marital status: Single    Spouse name: Not on file   Number of children: Not on file   Years of education: Not on file   Highest education level: Not on file  Occupational History   Not on file  Tobacco Use   Smoking status: Never   Smokeless tobacco: Never  Vaping Use   Vaping Use: Some days  Substance and Sexual Activity  Alcohol use: Not Currently   Drug use: Never   Sexual activity: Not Currently  Other Topics Concern   Not on file  Social History Narrative   Not on file   Social Determinants of Health   Financial Resource Strain: Not on file  Food Insecurity: Not on file  Transportation Needs: Not on file  Physical Activity: Not on file  Stress: Not on file  Social Connections: Not on file    Additional Social History:   Sleep: Good  Appetite:   Improving.  Current Medications: Current Facility-Administered Medications  Medication Dose Route Frequency Provider Last Rate Last Admin   acetaminophen (TYLENOL) tablet 650 mg  650 mg Oral Q6H PRN Carlyn Reichert, MD       alum & mag hydroxide-simeth (MAALOX/MYLANTA) 200-200-20 MG/5ML suspension 30 mL  30 mL Oral Q4H PRN Carlyn Reichert, MD       benztropine (COGENTIN) tablet 0.5 mg  0.5 mg Oral BID PRN Comer Locket, MD       feeding supplement (ENSURE ENLIVE / ENSURE PLUS) liquid 237 mL  237 mL Oral BID BM Kassiah Mccrory, NP   237 mL at 08/10/22 1056   risperiDONE (RISPERDAL M-TABS) disintegrating tablet 2 mg  2 mg Oral Q8H PRN Armandina Stammer I, NP       And   LORazepam (ATIVAN) tablet 1 mg  1 mg Oral PRN Armandina Stammer I, NP       And   ziprasidone (GEODON) injection 20 mg  20 mg Intramuscular PRN Armandina Stammer I, NP       magnesium hydroxide (MILK OF MAGNESIA) suspension 30 mL  30 mL Oral Daily PRN Carlyn Reichert, MD       mirtazapine (REMERON) tablet 15 mg  15 mg Oral QHS Starleen Blue, NP   15 mg at 08/09/22 2103   nicotine (NICODERM CQ - dosed in mg/24 hours) patch 14 mg  14 mg Transdermal Daily Armandina Stammer I, NP   14 mg at 08/10/22 0801   polyethylene glycol (MIRALAX / GLYCOLAX) packet 17 g  17 g Oral Daily Starleen Blue, NP   17 g at 08/09/22 0744   risperiDONE (RISPERDAL) tablet 1 mg  1 mg Oral BID Starleen Blue, NP   1 mg at 08/10/22 3818   Vitamin D (Ergocalciferol) (DRISDOL) 1.25 MG (50000 UNIT) capsule 50,000 Units  50,000 Units Oral Q7 days Starleen Blue, NP   50,000 Units at 08/09/22 1606   Lab Results:  Results for orders placed or performed during the hospital encounter of 08/05/22 (from the past 48 hour(s))  Urinalysis, Complete w Microscopic     Status: Abnormal   Collection Time: 08/08/22  7:58 PM  Result Value Ref Range   Color, Urine YELLOW YELLOW   APPearance HAZY (A) CLEAR   Specific Gravity,  Urine 1.020 1.005 - 1.030   pH 6.0 5.0 - 8.0   Glucose, UA NEGATIVE NEGATIVE mg/dL   Hgb urine dipstick MODERATE (A) NEGATIVE   Bilirubin Urine NEGATIVE NEGATIVE   Ketones, ur NEGATIVE NEGATIVE mg/dL   Protein, ur NEGATIVE NEGATIVE mg/dL   Nitrite NEGATIVE NEGATIVE   Leukocytes,Ua TRACE (A) NEGATIVE   RBC / HPF 0-5 0 - 5 RBC/hpf   WBC, UA 0-5 0 - 5 WBC/hpf   Bacteria, UA RARE (A) NONE SEEN   Squamous Epithelial / LPF 0-5 0 - 5   Mucus PRESENT     Comment: Performed at [facility], [address], [place], [place] [address]  VITAMIN D 25 Hydroxy (Vit-D Deficiency, Fractures)     Status: Abnormal   Collection Time: 08/09/22  6:40 AM  Result Value Ref Range   Vit D, 25-Hydroxy 12.89 (L) 30 - 100 ng/mL    Comment: (NOTE) Vitamin D deficiency has been defined by the Institute of Medicine  and an Endocrine Society practice guideline as a level of serum 25-OH  vitamin D less than 20 ng/mL (1,2). The Endocrine Society went on to  further define vitamin D insufficiency as a level between 21 and 29  ng/mL (2).  1. IOM (Institute of Medicine). 2010. Dietary reference intakes for  calcium and D. [place]: [employer]. 2. Holick MF, Binkley [place], Bischoff-Ferrari HA, et al. Evaluation,  treatment, and prevention of vitamin D deficiency: an Endocrine  Society clinical practice guideline, JCEM. 2011 Jul; 96(7): 1911-30.  Performed at [facility], [address] [place], [place] [address]   RPR     Status: None   Collection Time: 08/09/22  6:40 AM  Result Value Ref Range   RPR Ser Ql NON REACTIVE NON REACTIVE    Comment: Performed at [facility], [address], [place], [place] [address]  Comprehensive metabolic panel     Status: Abnormal   Collection Time: 08/09/22  6:40 AM  Result Value Ref Range   Sodium 142 135 - 145 mmol/L   Potassium 4.0 3.5 - 5.1 mmol/L   Chloride 110 98 - 111 mmol/L   CO2 27 22 - 32 mmol/L   Glucose, Bld  88 70 - 99 mg/dL    Comment: Glucose reference range applies only to samples taken after fasting for at least 8 hours.   BUN 12 6 - 20 mg/dL   Creatinine, Ser 0.72 0.44 - 1.00 mg/dL   Calcium 9.1 8.9 - 25.7 mg/dL   Total Protein 6.7 6.5 - 8.1 g/dL   Albumin 3.9 3.5 - 5.0 g/dL   AST 13 (L) 15 - 41 U/L   ALT 11 0 - 44 U/L   Alkaline Phosphatase 36 (L) 38 - 126 U/L   Total Bilirubin 0.3 0.3 - 1.2 mg/dL   GFR, Estimated >50 >51 mL/min    Comment: (NOTE) Calculated using the CKD-EPI Creatinine Equation (2021)    Anion gap 5 5 - 15    Comment: Performed at [facility], [address], [place], [place] [address]    Blood Alcohol level:  Lab Results  Component Value Date   ETH <10 08/04/2022   Metabolic Disorder Labs: Lab Results  Component Value Date   HGBA1C 5.1 08/04/2022   MPG 99.67 08/04/2022   No results found for: "PROLACTIN" Lab Results  Component Value Date   CHOL 144 08/04/2022   TRIG 30 08/04/2022   HDL 35 (L) 08/04/2022   CHOLHDL 4.1 08/04/2022   VLDL 6 08/04/2022   LDLCALC 103 (H) 08/04/2022   Physical Findings: AIMS: 0 CIWA:  n/a  COWS:  n/a   Musculoskeletal: Strength & Muscle Tone: within normal limits Gait & Station: normal Patient leans: N/A  Psychiatric Specialty Exam:  Presentation  General Appearance: Appropriate for Environment; Fairly Groomed  Eye Contact:Good  Speech:Clear and Coherent  Speech Volume:Normal  Handedness:Right   Mood and Affect  Mood:Euthymic  Affect:flat  Thought Process  Thought Processes:Coherent  Descriptions of Associations:Intact  Orientation:Full (Time, Place and Person)  Thought Content:concerns for paranoia with some speech hestiancy  History of Schizophrenia/Schizoaffective disorder:No  Duration of Psychotic Symptoms:Less  than six months  Hallucinations:Hallucinations: None   Ideas of Reference:None  Suicidal Thoughts:Suicidal Thoughts: No   Homicidal  Thoughts:Homicidal Thoughts: No   Sensorium  Memory:Immediate Good  Judgment:Fair  Insight:Fair  Executive Functions  Concentration:Good  Attention Span:Good  Recall:Good  Fund of Knowledge:Fair  Language:Fair  Psychomotor Activity  Psychomotor Activity:Psychomotor Activity: Normal   Assets  Assets:Communication Skills; Housing; Social Support  Sleep  Sleep:Sleep: Good   Physical Exam: Physical Exam Vitals and nursing note reviewed.  HENT:     Nose: Nose normal.     Mouth/Throat:     Pharynx: Oropharynx is clear.  Eyes:     Pupils: Pupils are equal, round, and reactive to light.  Cardiovascular:     Rate and Rhythm: Normal rate.     Pulses: Normal pulses.  Pulmonary:     Effort: Pulmonary effort is normal.  Genitourinary:    Comments: Deferred Musculoskeletal:        General: Normal range of motion.     Cervical back: Normal range of motion.  Skin:    General: Skin is warm and dry.  Neurological:     General: No focal deficit present.     Mental Status: She is alert and oriented to person, place, and time.    Review of Systems  Constitutional:  Negative for chills and fever.  HENT:  Negative for congestion and sore throat.   Respiratory:  Negative for cough, shortness of breath and wheezing.   Cardiovascular:  Negative for chest pain and palpitations.  Gastrointestinal:  Negative for abdominal pain, constipation, diarrhea, heartburn, nausea and vomiting.  Genitourinary:  Negative for dysuria.  Musculoskeletal:  Negative for joint pain and myalgias.  Skin: Negative.   Neurological:  Negative for dizziness, tingling, tremors, sensory change, speech change, focal weakness, seizures, loss of consciousness, weakness and headaches.  Endo/Heme/Allergies:        Allergies: NKDA  Psychiatric/Behavioral:  Positive for depression. Negative for hallucinations (Reports feeling paranoid.), memory loss, substance abuse and suicidal ideas. The patient is not  nervous/anxious and does not have insomnia.    Blood pressure 105/87, pulse 93, temperature 97.6 F (36.4 C), temperature source Oral, resp. rate 16, height 5\' 5"  (1.651 m), weight 59.9 kg, SpO2 100 %. Body mass index is 21.97 kg/m.  Treatment Plan Summary: Daily contact with patient to assess and evaluate symptoms and progress in treatment and Medication management.   Continue inpatient hospitalization.  Will continue today 08/10/2022 plan as below except where it is noted.   PLAN Safety and Monitoring: Voluntary admission to inpatient psychiatric unit for safety, stabilization and treatment Daily contact with patient to assess and evaluate symptoms and progress in treatment Patient's case discussed in multi-disciplinary team meeting Observation Level : q15 minute checks Vital signs: q12 hours Precautions: Safety   Long Term Goal(s): Improvement in symptoms so as ready for discharge   Diagnoses Principal Problem: Unspecified schizophrenia spectrum and other psychotic d/o  (r/o MDD with psychotic features, r/o schizophreniform d/o) Active Problems:   Insomnia   Anxiety state   Medications: We are adjusting medications today, 08/10/22 as below: -Continue Risperdal to 1 mg BID for psychosis/mood control.-Educated on rationales, benefits & possible side effects of medication. TSH, lipid panel & hemoglobin A1C WNL. EKG with QTC-450.  -Discontinue Trazodone 50 mg nightly prn d/t daytime grogginess.   -Continue Mirtazapine 15mg  po daily for depression/insomnia.  -Discontinue Hydroxyzine 25 mg po tid prn d/t daytime grogginess   -Continue Nicotine patch 14 mg trans-dermally Q 24  hours for nicotine withdrawal management.  -Continue Miralax daily for constipation  -Given Mg Citrate x 1 dose for constipation-completed  -Continue Vitamin D 50,000 units weekly for Vitamin D deficiency  Agitation/psychosis protocols.  -Risperdal M-tabs 2 mg po Q 8 hours prn.  &  -Lorazepam 1 mg po  prn x 1 dose.  & Geodon 20 mg IM prn x 1 dose.   Other PRNS -Continue Tylenol 650 mg every 6 hours PRN for mild pain -Continue Maalox 30 ml Q 4 hrs PRN for indigestion -Continue MOM 30 ml po Q 6 hrs for constipation  Labs reviewed: K-3.3 on admission and repeated 9/3 WNL at 4. TSH, Hemoglobin A1C WNL, CBC WNL, Lipid panel WNL. Urine pregnancy test negative. UA with hazy urine, moderate blood, trace leukocytes and rare bacteria; Pt reports that she is currently on her menstrual period, and denies any signs/symptoms of a UTI.  Toxicology screen negative. HIV NR, B12 WNL, EKG with QTC-450. First time psychosis labs pending (ANA, Ceruloplasmin, RPR (non reactive), heavy metal, Vitamin B1,  pending) Vitamin D low at 12.89. Will supplement with Vitamin D 50,000 units weekly. Head CT noncontrast pending   Discharge Planning: Social work and case management to assist with discharge planning and identification of hospital follow-up needs prior to discharge Estimated LOS: 5-7 days Discharge Concerns: Need to establish a safety plan; Medication compliance and effectiveness Discharge Goals: Return home with outpatient referrals for mental health follow-up including medication management/psychotherapy   Starleen Blue, NP, pmhnp 08/10/2022, 1:08 PMPatient ID: Sarah Solis, female   DOB: 06-Feb-2001, 21 y.o.   MRN: [id number] Patient ID: Yariana Hoaglund, female   DOB: 2001-07-11, 21 y.o.   MRN: [id number] Patient ID: Rissie Sculley, female   DOB: [birth year], 21 y.o.   MRN: [id number]

## 2022-08-10 NOTE — Progress Notes (Signed)
   08/09/22 2200  Psych Admission Type (Psych Patients Only)  Admission Status Involuntary  Psychosocial Assessment  Patient Complaints Worrying  Eye Contact Fair  Facial Expression Animated  Affect Blunted  Speech Soft;Slow;Logical/coherent  Interaction Assertive  Motor Activity Slow  Appearance/Hygiene Unremarkable  Behavior Characteristics Cooperative;Appropriate to situation  Mood Pleasant  Thought Process  Coherency WDL  Content WDL  Delusions None reported or observed  Perception WDL  Hallucination None reported or observed  Judgment Impaired  Confusion None  Danger to Self  Current suicidal ideation? Denies  Self-Injurious Behavior No self-injurious ideation or behavior indicators observed or expressed   Agreement Not to Harm Self Yes  Description of Agreement verbal  Danger to Others  Danger to Others None reported or observed

## 2022-08-11 DIAGNOSIS — F29 Unspecified psychosis not due to a substance or known physiological condition: Secondary | ICD-10-CM | POA: Diagnosis not present

## 2022-08-11 LAB — ANA W/REFLEX IF POSITIVE
Anti Nuclear Antibody (ANA): NEGATIVE
Anti Nuclear Antibody (ANA): NEGATIVE

## 2022-08-11 LAB — CERULOPLASMIN: Ceruloplasmin: 22.2 mg/dL (ref 19.0–39.0)

## 2022-08-11 MED ORDER — RISPERIDONE 0.5 MG PO TABS
1.5000 mg | ORAL_TABLET | Freq: Every day | ORAL | Status: DC
Start: 2022-08-11 — End: 2022-08-13
  Administered 2022-08-11 – 2022-08-12 (×2): 1.5 mg via ORAL
  Filled 2022-08-11 (×4): qty 1

## 2022-08-11 MED ORDER — RISPERIDONE 0.5 MG PO TABS
0.5000 mg | ORAL_TABLET | Freq: Every day | ORAL | Status: DC
Start: 2022-08-11 — End: 2022-08-13
  Administered 2022-08-11 – 2022-08-13 (×3): 0.5 mg via ORAL
  Filled 2022-08-11 (×5): qty 1

## 2022-08-11 MED ORDER — HYDROXYZINE HCL 10 MG PO TABS: 10.0000 mg | ORAL_TABLET | Freq: Three times a day (TID) | ORAL | Status: DC | PRN

## 2022-08-11 MED ORDER — POLYETHYLENE GLYCOL 3350 17 G PO PACK: 17.0000 g | PACK | Freq: Every day | ORAL | Status: DC | PRN

## 2022-08-11 NOTE — Progress Notes (Signed)
Adult Psychoeducational Group Note  Date:  08/11/2022 Time:  10:20 PM  Group Topic/Focus:  Wrap-Up Group:   The focus of this group is to help patients review their daily goal of treatment and discuss progress on daily workbooks.  Participation Level:  Active  Participation Quality:  Appropriate  Affect:  Appropriate  Cognitive:  Appropriate  Insight: Appropriate  Engagement in Group:  Engaged  Modes of Intervention:  Discussion  Additional Comments:   Pt attended and participated in the Wrap-Up group. Pt denies SI/Hi. Pt was initially guarded however as the group progressed she was more attentive, alert and engaged. Pt rates her day/mood 8/10. She made some progress toward increasing participation and engagement in treatment. Pt enjoyed the groups from earlier today. "It was more teamwork I felt comfortable". Pt reports being more social today as a coping skill.  Edmund Hilda Ronnie Doo 08/11/2022, 10:20 PM

## 2022-08-11 NOTE — Progress Notes (Signed)
Presents with, guarded, cautious with flat affect, fair eye contact, logical speech and ambulatory in milieu with slow but steady gait. However, pt did brightened up on interactions. Denies SI, HI, AVH and pain when assessed. Endorsed intermittent racing thoughts "Sometimes it's just bad thoughts, about bad stuff coming at me all at the same time but  not right now. I know it may not happen but it's there". Rates her anxiety 4/10 and depression 5/10 with current stressor been "thinking about bad stuff" but still will not elaborate on those just laughs when prompted. She remains medication compliant without adverse drug reactions. Pt attends scheduled groups. Interacts well with peers and staff. Safety checks maintained at Q 15 minutes intervals without self harm gestures. Emotional support, encouragement and reassurance provided to pt this shift. All medications administered with verbal education and effects monitored. Pt tolerates all meals and fluids well. Denies discomfort or concerns at this time.

## 2022-08-11 NOTE — Group Note (Signed)
Recreation Therapy Group Note   Group Topic:Team Building  Group Date: 08/11/2022 Start Time: 1000 End Time: 1030 Facilitators: Caroll Rancher, LRT,CTRS Location: 400 Hall Dayroom   Goal Area(s) Addresses:  Patient will effectively work with peer towards shared goal.  Patient will identify skills used to make activity successful.  Patient will identify how skills used during activity can be applied to reach post d/c goals.    Group Description: [employer]. In teams of 5-6, patients were given 11 craft pipe cleaners. Using the materials provided, patients were instructed to compete again the opposing team(s) to build the tallest free-standing structure from floor level. The activity was timed; difficulty increased by Clinical research associate as Production designer, theatre/television/film continued.  Systematically resources were removed with additional directions for example, placing one arm behind their back, working in silence, and shape stipulations. LRT facilitated post-activity discussion reviewing team processes and necessary communication skills involved in completion. Patients were encouraged to reflect how the skills utilized, or not utilized, in this activity can be incorporated to positively impact support systems post discharge.   Affect/Mood: Appropriate   Participation Level: Engaged   Participation Quality: Independent   Behavior: Appropriate   Speech/Thought Process: Focused   Insight: Good   Judgement: Good   Modes of Intervention: STEM Activity   Patient Response to Interventions:  Engaged   Education Outcome:  Acknowledges education and In group clarification offered    Clinical Observations/Individualized Feedback: Pt was soft spoken but worked well with peer.  Pt identified teamwork and structure as two of the skills needed to complete the activity.  Pt was bright and attentive.  Pt stated that you have to talk to someone to get help and then follow through. Pt was appropriate  throughout group.    Plan: Continue to engage patient in RT group sessions 2-3x/week.   Caroll Rancher, LRT,CTRS  08/11/2022 12:19 PM

## 2022-08-11 NOTE — BHH Counselor (Signed)
CSW spoke with Mrs. Sarah Solis [phone] who states that she has spoken with her daughter and that her daughter has told her that the medications are making her "sleepy".  She states that her daughter is improving but "is not as spunky".  Mrs. Sarah Solis also wanted to know about a discharge date.  CSW informed Mrs. Sarah Solis that there is not a scheduled discharge date at this time but that someone would let her know once one became available.

## 2022-08-11 NOTE — Group Note (Signed)
Date:  08/11/2022 Time:  9:43 AM  Group Topic/Focus:  Orientation:   The focus of this group is to educate the patient on the purpose and policies of crisis stabilization and provide a format to answer questions about their admission.  The group details unit policies and expectations of patients while admitted.    Participation Level:  Active  Participation Quality:  Appropriate  Affect:  Appropriate  Cognitive:  Appropriate  Insight: Good  Engagement in Group:  Engaged  Modes of Intervention:  Discussion  Additional Comments:     Reymundo Poll 08/11/2022, 9:43 AM

## 2022-08-11 NOTE — Progress Notes (Addendum)
[facility] MD Progress Note  08/11/2022 1:20 PM Sarah Solis  MRN:  [id number] Subjective:   "I only have a little bit of depression"  Reason for admission: Sarah Solis is a 21 year old female with no documented history of psychiatric illness who presented to [facility] via IVC petitioned by her mother Sarah Solis) for auditory hallucinations, paranoia, poor PO and ADLs. Per mother's reports, pt was "normal" x3 months ago where she abruptly quit her job and began isolating herself, she subsequently developed erratic behavior and auditory hallucinations. Noted family history of schizophrenia in father. UDS-, BAL<10.   24 hr chart review: Patient's vital signs remain WNL. She continues to be  compliant with scheduled medications; no prns noted overnight. Per chart review she did sleep 9.25 hours and is active on the milieu attending daily groups and activities.   Today's patient assessment note: Patient presents in her room laying down calm and cooperative. Reports having "only a little bit of depression" today. She endorses bathing and attending to ADLs. She denies any suicidal or homicidal ideations, auditory or visual hallucinations. She is alert and oriented; does not appear to be responding to any external/internal stimuli. States she no longer is experiencing any racing thoughts. Continues to report some drowsiness. Per chart review, Hydroxyzine 25 mg prn and Trazodone 50 mg were discontinued 08/11/22 for reports or drowsiness. Pt requesting prn med for anxiety and will start lower dose of atarax at 10 mg PRN. Pt reports paranoia is less and able to function outside of hospital at current level of intrusiveness of paranoid thoughts.  We discussed increasing overal risperdal dose or leaving at 2 mg total per day and pt chooses to continue at 2 mg total per day.  Attending MD Taniesha Glanz consulted; Risperidone adjusted to 0.5 mg qam and 1.5 mg night to less daytime fatigue.  Other medications continued as  listed below. Provider reviewed u/a results, pt c/o some pressure when urinating and agreed to STD panel. G/c panel ordered.   Principal Problem: Schizophrenia spectrum disorder with psychotic disorder type not yet determined (HCC) Diagnosis: Principal Problem:   Schizophrenia spectrum disorder with psychotic disorder type not yet determined (HCC) Active Problems:   Insomnia   Anxiety state  Total Time spent with patient: 30 minutes  Past Psychiatric History: see H&P  Past Medical History: History reviewed. No pertinent past medical history.  Past Surgical History:  Procedure Laterality Date   INCISION AND DRAINAGE PERIRECTAL ABSCESS Left 10/16/2020   Procedure: IRRIGATION AND DEBRIDEMENT PERIRECTAL ABSCESS;  Surgeon: Abigail Miyamoto, MD;  Location: [facility] OR;  Service: General;  Laterality: Left;   Family History: History reviewed. No pertinent family history. Family Psychiatric  History: schizophrenia- father Social History:  Social History   Substance and Sexual Activity  Alcohol Use Not Currently     Social History   Substance and Sexual Activity  Drug Use Never    Social History   Socioeconomic History   Marital status: Single    Spouse name: Not on file   Number of children: Not on file   Years of education: Not on file   Highest education level: Not on file  Occupational History   Not on file  Tobacco Use   Smoking status: Never   Smokeless tobacco: Never  Vaping Use   Vaping Use: Some days  Substance and Sexual Activity   Alcohol use: Not Currently   Drug use: Never   Sexual activity: Not Currently  Other Topics Concern   Not on  file  Social History Narrative   Not on file   Social Determinants of Health   Financial Resource Strain: Not on file  Food Insecurity: Not on file  Transportation Needs: Not on file  Physical Activity: Not on file  Stress: Not on file  Social Connections: Not on file   Additional Social History:    Sleep:  Good  Appetite:  Good  Current Medications: Current Facility-Administered Medications  Medication Dose Route Frequency Provider Last Rate Last Admin   acetaminophen (TYLENOL) tablet 650 mg  650 mg Oral Q6H PRN Carlyn Reichert, MD       alum & mag hydroxide-simeth (MAALOX/MYLANTA) 200-200-20 MG/5ML suspension 30 mL  30 mL Oral Q4H PRN Carlyn Reichert, MD       benztropine (COGENTIN) tablet 0.5 mg  0.5 mg Oral BID PRN Comer Locket, MD       feeding supplement (ENSURE ENLIVE / ENSURE PLUS) liquid 237 mL  237 mL Oral BID BM Nkwenti, Doris, NP   237 mL at 08/11/22 0936   risperiDONE (RISPERDAL M-TABS) disintegrating tablet 2 mg  2 mg Oral Q8H PRN Armandina Stammer I, NP       And   LORazepam (ATIVAN) tablet 1 mg  1 mg Oral PRN Armandina Stammer I, NP       And   ziprasidone (GEODON) injection 20 mg  20 mg Intramuscular PRN Nwoko, Agnes I, NP       magnesium hydroxide (MILK OF MAGNESIA) suspension 30 mL  30 mL Oral Daily PRN Carlyn Reichert, MD       mirtazapine (REMERON) tablet 15 mg  15 mg Oral QHS Nkwenti, Doris, NP   15 mg at 08/10/22 2108   nicotine (NICODERM CQ - dosed in mg/24 hours) patch 14 mg  14 mg Transdermal Daily Nwoko, Nicole Kindred I, NP   14 mg at 08/11/22 0844   polyethylene glycol (MIRALAX / GLYCOLAX) packet 17 g  17 g Oral Daily PRN Zharia Conrow, Harrold Donath, MD       risperiDONE (RISPERDAL) tablet 1 mg  1 mg Oral BID Nkwenti, Doris, NP   1 mg at 08/11/22 0841   Vitamin D (Ergocalciferol) (DRISDOL) 1.25 MG (50000 UNIT) capsule 50,000 Units  50,000 Units Oral Q7 days Starleen Blue, NP   50,000 Units at 08/09/22 1606    Lab Results: No results found for this or any previous visit (from the past 48 hour(s)).  Blood Alcohol level:  Lab Results  Component Value Date   ETH <10 08/04/2022    Metabolic Disorder Labs: Lab Results  Component Value Date   HGBA1C 5.1 08/04/2022   MPG 99.67 08/04/2022   No results found for: "PROLACTIN" Lab Results  Component Value Date   CHOL 144 08/04/2022    TRIG 30 08/04/2022   HDL 35 (L) 08/04/2022   CHOLHDL 4.1 08/04/2022   VLDL 6 08/04/2022   LDLCALC 103 (H) 08/04/2022    Physical Findings: AIMS:  , ,  ,  ,    CIWA:    COWS:     Musculoskeletal: Strength & Muscle Tone: within normal limits Gait & Station: normal Patient leans: N/A  Psychiatric Specialty Exam:  Presentation  General Appearance: Appropriate for Environment; Fairly Groomed  Eye Contact:Good  Speech:Clear and Coherent  Speech Volume:Normal  Handedness:Right   Mood and Affect  Mood:Euthymic Anxious   Affect:Congruent; Appropriate   Thought Process  Thought Processes:Coherent  Descriptions of Associations:Intact  Orientation:Full (Time, Place and Person)  Thought Content:Logical  History of Schizophrenia/Schizoaffective  disorder:No  Duration of Psychotic Symptoms:Less than six months  Hallucinations:Hallucinations: None  Ideas of Reference:None  Suicidal Thoughts:Suicidal Thoughts: No  Homicidal Thoughts:Homicidal Thoughts: No   Sensorium  Memory:Immediate Good; Recent Good  Judgment:Fair  Insight:Fair   Executive Functions  Concentration:Good  Attention Span:Good  Recall:Good  Fund of Knowledge:Good  Language:Good   Psychomotor Activity  Psychomotor Activity:Psychomotor Activity: Normal   Assets  Assets:Communication Skills; Desire for Improvement; Housing; Resilience; Social Support; Physical Health; Vocational/Educational   Sleep  Sleep:Sleep: Good    Physical Exam: Physical Exam Vitals and nursing note reviewed.  Constitutional:      Appearance: She is normal weight. She is not ill-appearing.  HENT:     Head: Normocephalic.     Nose: Nose normal.     Mouth/Throat:     Mouth: Mucous membranes are moist.     Pharynx: Oropharynx is clear.  Eyes:     Pupils: Pupils are equal, round, and reactive to light.  Cardiovascular:     Rate and Rhythm: Normal rate.     Pulses: Normal pulses.  Pulmonary:      Effort: Pulmonary effort is normal.  Abdominal:     General: Abdomen is flat.  Musculoskeletal:        General: Normal range of motion.     Cervical back: Normal range of motion.  Skin:    General: Skin is warm and dry.  Neurological:     Mental Status: She is alert and oriented to person, place, and time. Mental status is at baseline.  Psychiatric:        Attention and Perception: Attention and perception normal.        Mood and Affect: Affect normal. Mood is depressed.        Speech: Speech normal.        Behavior: Behavior is withdrawn. Behavior is cooperative.        Thought Content: Thought content normal. Thought content is not paranoid or delusional. Thought content does not include homicidal or suicidal ideation. Thought content does not include homicidal or suicidal plan.        Cognition and Memory: Cognition and memory normal.        Judgment: Judgment normal.     Comments: Endorses "little bit of depression"    Review of Systems  Neurological:  Negative for sensory change and speech change.  Psychiatric/Behavioral:  Positive for depression. Negative for hallucinations, memory loss, substance abuse and suicidal ideas. The patient is not nervous/anxious and does not have insomnia.   All other systems reviewed and are negative.  Blood pressure 97/65, pulse 92, temperature 98.1 F (36.7 C), temperature source Oral, resp. rate 16, height 5\' 5"  (1.651 m), weight 59.9 kg, SpO2 100 %. Body mass index is 21.97 kg/m.   Treatment Plan Summary: Daily contact with patient to assess and evaluate symptoms and progress in treatment, Medication management, and Plan continue inpatient psychiatric hospitalization including plan listed below.   PLAN Safety and Monitoring: Voluntary admission to inpatient psychiatric unit for safety, stabilization and treatment Daily contact with patient to assess and evaluate symptoms and progress in treatment Patient's case discussed in  multi-disciplinary team meeting Observation Level : q15 minute checks Vital signs: q12 hours Precautions: Safety   Long Term Goal(s): Improvement in symptoms so as ready for discharge   Diagnoses Principal Problem: Unspecified schizophrenia spectrum and other psychotic d/o  (r/o MDD with psychotic features, r/o schizophreniform d/o) Active Problems:   Insomnia   Anxiety state  Medications: We are adjusting medications today, 08/10/22 as below: -Change Risperdal to 0.5 mg qam and 1.5 mg HS for psychosis/mood control; adjusted to address daytime drowsiness.-Educated on rationales, benefits & possible side effects of medication. TSH, lipid panel & hemoglobin A1C WNL. EKG with QTC-450.    -Continue: Mirtazapine to 15 mg po daily for depression/insomnia.    -Start atarax 10 mg tid prn for anxiety   -Continue Nicotine patch 14 mg trans-dermally Q 24 hours for nicotine withdrawal management.   -Adjust: Miralax daily from scheduled to prn for constipation   -Continue Vitamin D 50,000 units weekly for Vitamin D deficiency   Agitation/psychosis protocols.  -Risperdal M-tabs 2 mg po Q 8 hours prn.   -Lorazepam 1 mg po prn x 1 dose.  -Geodon 20 mg IM prn x 1 dose.   Other PRNS -Continue Tylenol 650 mg every 6 hours PRN for mild pain -Continue Maalox 30 ml Q 4 hrs PRN for indigestion -Continue MOM 30 ml po Q 6 hrs for constipation   Labs reviewed: K-3.3 on admission and repeated 9/3 WNL at 4. TSH, Hemoglobin A1C WNL, CBC WNL, Lipid panel WNL. Urine pregnancy test negative. UA with hazy urine, moderate blood, trace leukocytes and rare bacteria; Pt reports that she is currently on her menstrual period, and denies any signs/symptoms of a UTI. STD panel ordered as precaution. Toxicology screen negative. HIV NR, B12 WNL, EKG with QTC-450. First time psychosis labs pending ANA-negative, Ceruloplasmin-22.2, RPR (non reactive) (heavy metal, Vitamin B1,  pending) Vitamin D low at 12.89. Will supplement  with Vitamin D 50,000 units weekly. Head CT noncontrast shows no acute findings.   Discharge Planning: Social work and case management to assist with discharge planning and identification of hospital follow-up needs prior to discharge Estimated LOS: 5-7 days Discharge Concerns: Need to establish a safety plan; Medication compliance and effectiveness Discharge Goals: Return home with outpatient referrals for mental health follow-up including medication management/psychotherapy    Loletta Parish, NP 08/11/2022, 1:20 PM Total Time Spent in Direct Patient Care:  I personally spent 30 minutes on the unit in direct patient care. The direct patient care time included face-to-face time with the patient, reviewing the patient's chart, communicating with other professionals, and coordinating care. Greater than 50% of this time was spent in counseling or coordinating care with the patient regarding goals of hospitalization, psycho-education, and discharge planning needs.  I have independently evaluated the patient during a face-to-face assessment on 08/11/22. I reviewed the patient's chart, and I participated in key portions of the service. I discussed the case with the APP, and I agree with the assessment and plan of care as documented in the APP's note , as addended by me or notated below:  I directly edited the note, as above. Dc planning for Thursday, if pt tolerates med changes of risperdal wihtout decompensation.   Phineas Inches, MD Psychiatrist

## 2022-08-12 ENCOUNTER — Encounter (HOSPITAL_COMMUNITY): Payer: Self-pay

## 2022-08-12 DIAGNOSIS — F29 Unspecified psychosis not due to a substance or known physiological condition: Secondary | ICD-10-CM | POA: Diagnosis not present

## 2022-08-12 LAB — GC/CHLAMYDIA PROBE AMP (~~LOC~~) NOT AT ARMC
Chlamydia: NEGATIVE
Comment: NEGATIVE
Comment: NORMAL
Neisseria Gonorrhea: NEGATIVE

## 2022-08-12 LAB — VITAMIN B1
Vitamin B1 (Thiamine): 68.3 nmol/L (ref 66.5–200.0)
Vitamin B1 (Thiamine): 68.2 nmol/L (ref 66.5–200.0)

## 2022-08-12 NOTE — Progress Notes (Signed)
[facility] MD Progress Note  08/12/2022 11:43 AM Sarah Solis  MRN:  [id number] Subjective:   "I only have a little bit of depression"  Reason for admission: Sarah Solis is a 21 year old female with no documented history of psychiatric illness who presented to [facility] via IVC petitioned by her mother [name]) for auditory hallucinations, paranoia, poor PO and ADLs. Per mother's reports, pt was "normal" x3 months ago where she abruptly quit her job and began isolating herself, she subsequently developed erratic behavior and auditory hallucinations. Noted family history of schizophrenia in father. UDS-, BAL<10.   24 hr chart review: Patient's vital signs remain WNL with the exception of one elevation in HR to 11 earlier today morning. She is continuing to be  compliant with scheduled medications, and did not require PRN medications in the past 24 hrs. Pt has attended one group session in the past 24 hrs, and was appropriate and engaged when she attended.  Today's patient assessment note: Patient is seen in her room on the 400 hall. Pt presents with a euthymic mood, and affect is congruent. Her attention to personal hygiene and grooming is fair, eye contact is good, speech is clear & coherent. Thought contents are organized and logical, and pt currently denies SI/HI/AVH or paranoia. There is no evidence of delusional thoughts.    Pt reports a good sleep quality last night, and reports a good appetite. She denies any current medication related side effects. She reports being less tired with the reduction in the dose of her Risperdal. Urinalysis with a moderate amount of blood, but pt reports that she was on her monthly menstrual period when blood was collected. Pt reported some pressure on urination yesterday, and GC/Chlamydia test ordered, but in process. First time psychosis labs reviewed, and all WNL. We will continue medications as listed below. Pt verbalizes readiness for discharge, and plan is to  discharge her tomorrow pending safety planning with her mother by CSW.   Principal Problem: Schizophrenia spectrum disorder with psychotic disorder type not yet determined (HCC) Diagnosis: Principal Problem:   Schizophrenia spectrum disorder with psychotic disorder type not yet determined (HCC) Active Problems:   Insomnia   Anxiety state  Total Time spent with patient: 30 minutes  Past Psychiatric History: see H&P  Past Medical History: History reviewed. No pertinent past medical history.  Past Surgical History:  Procedure Laterality Date   INCISION AND DRAINAGE PERIRECTAL ABSCESS Left 10/16/2020   Procedure: IRRIGATION AND DEBRIDEMENT PERIRECTAL ABSCESS;  Surgeon: Abigail Miyamoto, MD;  Location: [facility] OR;  Service: General;  Laterality: Left;   Family History: History reviewed. No pertinent family history. Family Psychiatric  History: schizophrenia- father Social History:  Social History   Substance and Sexual Activity  Alcohol Use Not Currently     Social History   Substance and Sexual Activity  Drug Use Never    Social History   Socioeconomic History   Marital status: Single    Spouse name: Not on file   Number of children: Not on file   Years of education: Not on file   Highest education level: Not on file  Occupational History   Not on file  Tobacco Use   Smoking status: Never   Smokeless tobacco: Never  Vaping Use   Vaping Use: Some days  Substance and Sexual Activity   Alcohol use: Not Currently   Drug use: Never   Sexual activity: Not Currently  Other Topics Concern   Not on file  Social History  Narrative   Not on file   Social Determinants of Health   Financial Resource Strain: Not on file  Food Insecurity: Not on file  Transportation Needs: Not on file  Physical Activity: Not on file  Stress: Not on file  Social Connections: Not on file   Additional Social History:    Sleep: Good  Appetite:  Good  Current Medications: Current  Facility-Administered Medications  Medication Dose Route Frequency Provider Last Rate Last Admin   acetaminophen (TYLENOL) tablet 650 mg  650 mg Oral Q6H PRN Carlyn Reichert, MD       alum & mag hydroxide-simeth (MAALOX/MYLANTA) 200-200-20 MG/5ML suspension 30 mL  30 mL Oral Q4H PRN Carlyn Reichert, MD       benztropine (COGENTIN) tablet 0.5 mg  0.5 mg Oral BID PRN Comer Locket, MD       feeding supplement (ENSURE ENLIVE / ENSURE PLUS) liquid 237 mL  237 mL Oral BID BM Carlyn Lemke, NP   237 mL at 08/11/22 1521   hydrOXYzine (ATARAX) tablet 10 mg  10 mg Oral TID PRN Massengill, Harrold Donath, MD       risperiDONE (RISPERDAL M-TABS) disintegrating tablet 2 mg  2 mg Oral Q8H PRN Armandina Stammer I, NP       And   LORazepam (ATIVAN) tablet 1 mg  1 mg Oral PRN Armandina Stammer I, NP       And   ziprasidone (GEODON) injection 20 mg  20 mg Intramuscular PRN Nwoko, Agnes I, NP       magnesium hydroxide (MILK OF MAGNESIA) suspension 30 mL  30 mL Oral Daily PRN Carlyn Reichert, MD       mirtazapine (REMERON) tablet 15 mg  15 mg Oral QHS Juleah Paradise, NP   15 mg at 08/11/22 2041   nicotine (NICODERM CQ - dosed in mg/24 hours) patch 14 mg  14 mg Transdermal Daily Nwoko, Nicole Kindred I, NP   14 mg at 08/12/22 0839   polyethylene glycol (MIRALAX / GLYCOLAX) packet 17 g  17 g Oral Daily PRN Massengill, Harrold Donath, MD       risperiDONE (RISPERDAL) tablet 0.5 mg  0.5 mg Oral Daily Massengill, Nathan, MD   0.5 mg at 08/12/22 0838   risperiDONE (RISPERDAL) tablet 1.5 mg  1.5 mg Oral QHS Leevy-Johnson, Brooke A, NP   1.5 mg at 08/11/22 2040   Vitamin D (Ergocalciferol) (DRISDOL) 1.25 MG (50000 UNIT) capsule 50,000 Units  50,000 Units Oral Q7 days Starleen Blue, NP   50,000 Units at 08/09/22 1606    Lab Results: No results found for this or any previous visit (from the past 48 hour(s)).  Blood Alcohol level:  Lab Results  Component Value Date   ETH <10 08/04/2022    Metabolic Disorder Labs: Lab Results  Component Value  Date   HGBA1C 5.1 08/04/2022   MPG 99.67 08/04/2022   No results found for: "PROLACTIN" Lab Results  Component Value Date   CHOL 144 08/04/2022   TRIG 30 08/04/2022   HDL 35 (L) 08/04/2022   CHOLHDL 4.1 08/04/2022   VLDL 6 08/04/2022   LDLCALC 103 (H) 08/04/2022    Physical Findings: AIMS: 0 CIWA: n/a COWS:   n/a  Musculoskeletal: Strength & Muscle Tone: within normal limits Gait & Station: normal Patient leans: N/A  Psychiatric Specialty Exam:  Presentation  General Appearance: Appropriate for Environment; Fairly Groomed  Eye Contact:Good  Speech:Clear and Coherent  Speech Volume:Normal  Handedness:Right   Mood and Affect  Mood:Euthymic Anxious  Affect:Appropriate; Congruent   Thought Process  Thought Processes:Coherent  Descriptions of Associations:Intact  Orientation:Full (Time, Place and Person)  Thought Content:Logical  History of Schizophrenia/Schizoaffective disorder:No  Duration of Psychotic Symptoms:N/A  Hallucinations:Hallucinations: None  Ideas of Reference:None  Suicidal Thoughts:Suicidal Thoughts: No  Homicidal Thoughts:Homicidal Thoughts: No   Sensorium  Memory:Immediate Good  Judgment:Good  Insight:Good   Executive Functions  Concentration:Good  Attention Span:Good  Recall:Good  Fund of Knowledge:Good  Language:Good   Psychomotor Activity  Psychomotor Activity:Psychomotor Activity: Normal   Assets  Assets:Communication Skills   Sleep  Sleep:Sleep: Good    Physical Exam: Physical Exam Vitals and nursing note reviewed.  Constitutional:      Appearance: She is normal weight. She is not ill-appearing.  HENT:     Head: Normocephalic.     Nose: Nose normal.     Mouth/Throat:     Mouth: Mucous membranes are moist.     Pharynx: Oropharynx is clear.  Eyes:     Pupils: Pupils are equal, round, and reactive to light.  Cardiovascular:     Rate and Rhythm: Normal rate.     Pulses: Normal pulses.   Pulmonary:     Effort: Pulmonary effort is normal.  Abdominal:     General: Abdomen is flat.  Musculoskeletal:        General: Normal range of motion.     Cervical back: Normal range of motion.  Skin:    General: Skin is warm and dry.  Neurological:     Mental Status: She is alert and oriented to person, place, and time. Mental status is at baseline.  Psychiatric:        Attention and Perception: Attention and perception normal.        Mood and Affect: Affect normal. Mood is depressed.        Speech: Speech normal.        Behavior: Behavior is withdrawn. Behavior is cooperative.        Thought Content: Thought content normal. Thought content is not paranoid or delusional. Thought content does not include homicidal or suicidal ideation. Thought content does not include homicidal or suicidal plan.        Cognition and Memory: Cognition and memory normal.        Judgment: Judgment normal.     Comments: Endorses "little bit of depression"    Review of Systems  Neurological:  Negative for sensory change and speech change.  Psychiatric/Behavioral:  Positive for depression (Resolving). Negative for hallucinations, memory loss, substance abuse and suicidal ideas. The patient is not nervous/anxious and does not have insomnia.   All other systems reviewed and are negative.  Blood pressure 95/64, pulse (!) 111, temperature 98.2 F (36.8 C), temperature source Oral, resp. rate 16, height 5\' 5"  (1.651 m), weight 59.9 kg, SpO2 100 %. Body mass index is 21.97 kg/m.   Treatment Plan Summary: Daily contact with patient to assess and evaluate symptoms and progress in treatment, Medication management, and Plan continue inpatient psychiatric hospitalization including plan listed below.   PLAN Safety and Monitoring: Voluntary admission to inpatient psychiatric unit for safety, stabilization and treatment Daily contact with patient to assess and evaluate symptoms and progress in  treatment Patient's case discussed in multi-disciplinary team meeting Observation Level : q15 minute checks Vital signs: q12 hours Precautions: Safety   Long Term Goal(s): Improvement in symptoms so as ready for discharge   Diagnoses Principal Problem: Unspecified schizophrenia spectrum and other psychotic d/o  (r/o MDD with psychotic features, r/o  schizophreniform d/o) Active Problems:   Insomnia   Anxiety state   Medications: We are adjusting medications today, 08/12/22 as below: -Continue Risperdal 0.5 mg qam and 1.5 mg HS for psychosis/mood control; adjusted to address daytime drowsiness.-Educated on rationales, benefits & possible side effects of medication. TSH, lipid panel & hemoglobin A1C WNL. EKG with QTC-450.    -Continue: Mirtazapine to 15 mg po daily for depression/insomnia.    -Continue atarax 10 mg tid prn for anxiety   -Continue Nicotine patch 14 mg trans-dermally Q 24 hours for nicotine withdrawal management.   -Continue Miralax daily from scheduled to prn for constipation   -Continue Vitamin D 50,000 units weekly for Vitamin D deficiency   Agitation/psychosis protocols.  -Risperdal M-tabs 2 mg po Q 8 hours prn.   -Lorazepam 1 mg po prn x 1 dose.  -Geodon 20 mg IM prn x 1 dose.   Other PRNS -Continue Tylenol 650 mg every 6 hours PRN for mild pain -Continue Maalox 30 ml Q 4 hrs PRN for indigestion -Continue MOM 30 ml po Q 6 hrs for constipation   Labs reviewed: K-3.3 on admission and repeated 9/3 WNL at 4. TSH, Hemoglobin A1C WNL, CBC WNL, Lipid panel WNL. Urine pregnancy test negative. UA with hazy urine, moderate blood, trace leukocytes and rare bacteria; Pt reports that she is currently on her menstrual period, and denies any signs/symptoms of a UTI. STD panel ordered as precaution. Toxicology screen negative. HIV NR, B12 WNL, EKG with QTC-450. First time psychosis labs WNL. ANA-negative, Ceruloplasmin-22.2, RPR (non reactive) (heavy metal, Vitamin B1,  pending)  Vitamin D low at 12.89. Supplementing with Vitamin D 50,000 units weekly. Head CT noncontrast shows no acute findings.   Discharge Planning: Social work and case management to assist with discharge planning and identification of hospital follow-up needs prior to discharge Estimated LOS: 5-7 days Discharge Concerns: Need to establish a safety plan; Medication compliance and effectiveness Discharge Goals: Return home with outpatient referrals for mental health follow-up including medication management/psychotherapy   Starleen Blue, NP 08/12/2022, 11:43 AM

## 2022-08-12 NOTE — Group Note (Signed)
Date:  08/12/2022 Time:  9:55 AM  Group Topic/Focus:  Orientation:   The focus of this group is to educate the patient on the purpose and policies of crisis stabilization and provide a format to answer questions about their admission.  The group details unit policies and expectations of patients while admitted.    Participation Level:  Did Not Attend  Participation Quality:        Affect:      Cognitive:      Insight: None  Engagement in Group:      Modes of Intervention:      Additional Comments:     Reymundo Poll 08/12/2022, 9:55 AM

## 2022-08-12 NOTE — Group Note (Signed)
LCSW Group Therapy Note   Group Date: 08/12/2022 Start Time: 1300 End Time: 1400  Type of Therapy and Topic:  Group Therapy - Healthy vs Unhealthy Coping Skills  Participation Level:  Active   Description of Group The focus of this group was to determine what unhealthy coping techniques typically are used by group members and what healthy coping techniques would be helpful in coping with various problems. Patients were guided in becoming aware of the differences between healthy and unhealthy coping techniques. Patients were asked to identify 2-3 healthy coping skills they would like to learn to use more effectively.  Therapeutic Goals Patients learned that coping is what human beings do all day long to deal with various situations in their lives Patients defined and discussed healthy vs unhealthy coping techniques Patients identified their preferred coping techniques and identified whether these were healthy or unhealthy Patients determined 2-3 healthy coping skills they would like to become more familiar with and use more often. Patients provided support and ideas to each other   Summary of Patient Progress:  During group, the Pt expressed that meditating is a helpful coping skill for them. Patient proved open to input from peers and feedback from CSW. Patient demonstrated insight into the subject matter, was respectful of peers, and participated throughout the entire session.   Therapeutic Modalities Cognitive Behavioral Therapy Motivational Interviewing  Aram Beecham, [place] 08/12/2022  1:58 PM

## 2022-08-12 NOTE — BH IP Treatment Plan (Signed)
Interdisciplinary Treatment and Diagnostic Plan Update  08/12/2022 Time of Session: 9:40am Sarah Solis MRN: [id number]  Principal Diagnosis: Schizophrenia spectrum disorder with psychotic disorder type not yet determined [facility])  Secondary Diagnoses: Principal Problem:   Schizophrenia spectrum disorder with psychotic disorder type not yet determined ([place]) Active Problems:   Insomnia   Anxiety state   Current Medications:  Current Facility-Administered Medications  Medication Dose Route Frequency Provider Last Rate Last Admin   acetaminophen (TYLENOL) tablet 650 mg  650 mg Oral Q6H PRN Corky Sox, MD       alum & mag hydroxide-simeth (MAALOX/MYLANTA) 200-200-20 MG/5ML suspension 30 mL  30 mL Oral Q4H PRN Corky Sox, MD       benztropine (COGENTIN) tablet 0.5 mg  0.5 mg Oral BID PRN Harlow Asa, MD       feeding supplement (ENSURE ENLIVE / ENSURE PLUS) liquid 237 mL  237 mL Oral BID BM Nkwenti, Doris, NP   237 mL at 08/11/22 1521   hydrOXYzine (ATARAX) tablet 10 mg  10 mg Oral TID PRN Massengill, Ovid Curd, MD       risperiDONE (RISPERDAL M-TABS) disintegrating tablet 2 mg  2 mg Oral Q8H PRN Lindell Spar I, NP       And   LORazepam (ATIVAN) tablet 1 mg  1 mg Oral PRN Lindell Spar I, NP       And   ziprasidone (GEODON) injection 20 mg  20 mg Intramuscular PRN Nwoko, Herbert Pun I, NP       magnesium hydroxide (MILK OF MAGNESIA) suspension 30 mL  30 mL Oral Daily PRN Corky Sox, MD       mirtazapine (REMERON) tablet 15 mg  15 mg Oral QHS Nkwenti, Doris, NP   15 mg at 08/11/22 2041   nicotine (NICODERM CQ - dosed in mg/24 hours) patch 14 mg  14 mg Transdermal Daily Lindell Spar I, NP   14 mg at 08/12/22 0839   polyethylene glycol (MIRALAX / GLYCOLAX) packet 17 g  17 g Oral Daily PRN Massengill, Ovid Curd, MD       risperiDONE (RISPERDAL) tablet 0.5 mg  0.5 mg Oral Daily Massengill, Nathan, MD   0.5 mg at 08/12/22 [id number]   risperiDONE (RISPERDAL) tablet 1.5 mg  1.5 mg Oral QHS  Leevy-Johnson, Brooke A, NP   1.5 mg at 08/11/22 2040   Vitamin D (Ergocalciferol) (DRISDOL) 1.25 MG (50000 UNIT) capsule 50,000 Units  50,000 Units Oral Q7 days Nicholes Rough, NP   50,000 Units at 08/09/22 1606   PTA Medications: Medications Prior to Admission  Medication Sig Dispense Refill Last Dose   risperiDONE (RISPERDAL) 0.5 MG tablet Take 1 tablet (0.5 mg total) by mouth daily.      risperiDONE (RISPERDAL) 1 MG tablet Take 1 tablet (1 mg total) by mouth at bedtime.       Patient Stressors: Occupational concerns   Other:     Patient Strengths: Capable of independent living  Physical Health  Supportive family/friends   Treatment Modalities: Medication Management, Group therapy, Case management,  1 to 1 session with clinician, Psychoeducation, Recreational therapy.   Physician Treatment Plan for Primary Diagnosis: Schizophrenia spectrum disorder with psychotic disorder type not yet determined ([place]) Long Term Goal(s): Improvement in symptoms so as ready for discharge   Short Term Goals: Ability to identify and develop effective coping behaviors will improve Ability to maintain clinical measurements within normal limits will improve Compliance with prescribed medications will improve Ability to identify triggers associated with substance abuse/mental health  issues will improve Ability to identify changes in lifestyle to reduce recurrence of condition will improve Ability to verbalize feelings will improve Ability to disclose and discuss suicidal ideas Ability to demonstrate self-control will improve  Medication Management: Evaluate patient's response, side effects, and tolerance of medication regimen.  Therapeutic Interventions: 1 to 1 sessions, Unit Group sessions and Medication administration.  Evaluation of Outcomes: Progressing  Physician Treatment Plan for Secondary Diagnosis: Principal Problem:   Schizophrenia spectrum disorder with psychotic disorder type not yet  determined (HCC) Active Problems:   Insomnia   Anxiety state  Long Term Goal(s): Improvement in symptoms so as ready for discharge   Short Term Goals: Ability to identify and develop effective coping behaviors will improve Ability to maintain clinical measurements within normal limits will improve Compliance with prescribed medications will improve Ability to identify triggers associated with substance abuse/mental health issues will improve Ability to identify changes in lifestyle to reduce recurrence of condition will improve Ability to verbalize feelings will improve Ability to disclose and discuss suicidal ideas Ability to demonstrate self-control will improve     Medication Management: Evaluate patient's response, side effects, and tolerance of medication regimen.  Therapeutic Interventions: 1 to 1 sessions, Unit Group sessions and Medication administration.  Evaluation of Outcomes: Progressing   RN Treatment Plan for Primary Diagnosis: Schizophrenia spectrum disorder with psychotic disorder type not yet determined (HCC) Long Term Goal(s): Knowledge of disease and therapeutic regimen to maintain health will improve  Short Term Goals: Ability to remain free from injury will improve, Ability to participate in decision making will improve, Ability to verbalize feelings will improve, Ability to disclose and discuss suicidal ideas, and Ability to identify and develop effective coping behaviors will improve  Medication Management: RN will administer medications as ordered by provider, will assess and evaluate patient's response and provide education to patient for prescribed medication. RN will report any adverse and/or side effects to prescribing provider.  Therapeutic Interventions: 1 on 1 counseling sessions, Psychoeducation, Medication administration, Evaluate responses to treatment, Monitor vital signs and CBGs as ordered, Perform/monitor CIWA, COWS, AIMS and Fall Risk screenings as  ordered, Perform wound care treatments as ordered.  Evaluation of Outcomes: Progressing   LCSW Treatment Plan for Primary Diagnosis: Schizophrenia spectrum disorder with psychotic disorder type not yet determined (HCC) Long Term Goal(s): Safe transition to appropriate next level of care at discharge, Engage patient in therapeutic group addressing interpersonal concerns.  Short Term Goals: Engage patient in aftercare planning with referrals and resources, Increase social support, Increase emotional regulation, Facilitate acceptance of mental health diagnosis and concerns, Identify triggers associated with mental health/substance abuse issues, and Increase skills for wellness and recovery  Therapeutic Interventions: Assess for all discharge needs, 1 to 1 time with Social worker, Explore available resources and support systems, Assess for adequacy in community support network, Educate family and significant other(s) on suicide prevention, Complete Psychosocial Assessment, Interpersonal group therapy.  Evaluation of Outcomes: Progressing   Progress in Treatment: Attending groups: No. Participating in groups: No. Taking medication as prescribed: Yes. Toleration medication: Yes. Family/Significant other contact made: Yes, individual(s) contacted:  [name] [phone] (Mother)  Patient understands diagnosis: No. Discussing patient identified problems/goals with staff: No. Medical problems stabilized or resolved: Yes. Denies suicidal/homicidal ideation: Yes. Issues/concerns per patient self-inventory: No.     New problem(s) identified: No, Describe:  none reported    New Short Term/Long Term Goal(s):    medication stabilization, elimination of SI thoughts, development of comprehensive mental wellness plan.  Patient Goals:  Patient states goals are to "stay more active"   Discharge Plan or Barriers: Patient recently admitted. CSW will continue to follow and assess for  appropriate referrals and possible discharge planning.      Reason for Continuation of Hospitalization: Delusions  Depression Hallucinations Medication stabilization Suicidal ideation   Estimated Length of Stay: 3-7 days    Last 3 [place] Suicide Severity Risk Score: Flowsheet Row [facility] (Current[facility] in [facility]  C-SSRS RISK CATEGORY No Risk No Risk       Last PHQ 2/9 Scores:     No data to display          Scribe for Treatment Team: Catha Brow 08/12/2022 8:52 AM

## 2022-08-12 NOTE — [facility] Group Notes (Signed)
Spirituality group facilitated by Kathleen Argue, [facility].   Group Description: Group focused on topic of hope. Patients participated in facilitated discussion around topic, connecting with one another around experiences and definitions for hope. Group members engaged with visual explorer photos, reflecting on what hope looks like for them today. Group engaged in discussion around how their definitions of hope are present today in hospital.   Modalities: Psycho-social ed, Adlerian, Narrative, MI   Patient Progress: Sarah Solis attended group and actively engaged and participated in the group conversation.  She requested to bring the topic of strength into the conversation.  Her comments showed insight into the topic.  [address], [facility] Pager, [phone]

## 2022-08-12 NOTE — Progress Notes (Signed)

## 2022-08-12 NOTE — Plan of Care (Signed)
°  Problem: Education: °Goal: Emotional status will improve °Outcome: Progressing °Goal: Mental status will improve °Outcome: Progressing °  °Problem: Activity: °Goal: Interest or engagement in activities will improve °Outcome: Progressing °  °

## 2022-08-12 NOTE — Progress Notes (Signed)
     08/12/22 2038  Psych Admission Type (Psych Patients Only)  Admission Status Involuntary  Psychosocial Assessment  Patient Complaints Anxiety  Eye Contact Fair  Facial Expression Fixed smile  Affect Appropriate to circumstance  Speech Soft;Logical/coherent  Interaction Cautious;Childlike;Forwards little  Motor Activity Slow  Appearance/Hygiene Unremarkable  Behavior Characteristics Cooperative;Calm  Mood Pleasant;Anxious;Depressed  Thought Process  Coherency WDL  Content WDL  Delusions None reported or observed  Perception WDL  Hallucination None reported or observed  Judgment Limited  Confusion None  Danger to Self  Current suicidal ideation? Denies  Self-Injurious Behavior No self-injurious ideation or behavior indicators observed or expressed   Agreement Not to Harm Self Yes  Description of Agreement verbal  Danger to Others  Danger to Others None reported or observed

## 2022-08-12 NOTE — BHH Counselor (Signed)
CSW spoke with the Pt's mother, Leafy Ro, who states that she will be the individual picking her daughter up from the hospital after discharge.  She states that she will be at the hospital around 11:30am and would like her daughter's medications to be sent to the [facility] on the corner of [address] and [address] in [place]. CSW will inform the providers of this information.

## 2022-08-13 DIAGNOSIS — F29 Unspecified psychosis not due to a substance or known physiological condition: Secondary | ICD-10-CM | POA: Diagnosis not present

## 2022-08-13 LAB — HEAVY METALS, BLOOD
Arsenic: 2 ug/L (ref 0–9)
Lead: <1 ug/dL (ref 0.0–3.4)
Mercury: 1.4 ug/L (ref 0.0–14.9)

## 2022-08-13 MED ORDER — MIRTAZAPINE 15 MG PO TABS: 15.0000 mg | ORAL_TABLET | Freq: Every day | ORAL | 0 refills | Status: DC

## 2022-08-13 MED ORDER — VITAMIN D (ERGOCALCIFEROL) 1.25 MG (50000 UNIT) PO CAPS: 50000.0000 [IU] | ORAL_CAPSULE | ORAL | 0 refills | Status: AC

## 2022-08-13 MED ORDER — RISPERIDONE 0.5 MG PO TABS: 1.5000 mg | ORAL_TABLET | Freq: Every day | ORAL | 0 refills | Status: DC

## 2022-08-13 MED ORDER — RISPERIDONE 0.5 MG PO TABS: 0.5000 mg | ORAL_TABLET | Freq: Every morning | ORAL | 0 refills | Status: DC

## 2022-08-13 MED ORDER — HYDROXYZINE HCL 10 MG PO TABS: 10.0000 mg | ORAL_TABLET | Freq: Three times a day (TID) | ORAL | 0 refills | Status: DC | PRN

## 2022-08-13 MED ORDER — NICOTINE 14 MG/24HR TD PT24: 14.0000 mg | MEDICATED_PATCH | Freq: Every day | TRANSDERMAL | 0 refills | Status: AC

## 2022-08-13 NOTE — BHH Group Notes (Signed)
Adult Psychoeducational Group Note  Date:  08/13/2022 Time:  10:34 AM  Group Topic/Focus:  Goals Group:   The focus of this group is to help patients establish daily goals to achieve during treatment and discuss how the patient can incorporate goal setting into their daily lives to aide in recovery.  Participation Level:  Active  Participation Quality:  Attentive  Affect:  Appropriate  Cognitive:  Appropriate  Insight: Appropriate  Engagement in Group:  Engaged  Modes of Intervention:  Discussion  Additional Comments:  Patient attended goals group and was attentive the duration of it. Patient's goal was to have a positive discharge.   Sarah Solis T Desaree Downen 08/13/2022, 10:34 AM

## 2022-08-13 NOTE — Discharge Summary (Signed)
Physician Discharge Summary Note  Patient:  Sarah Solis is an 21 y.o., female MRN:  [id number] DOB:  [birth year] Patient phone:  [phone] (home)  Patient address:   [address] [place] [address],  Total Time spent with patient: 30 minutes  Date of Admission:  08/05/2022 Date of Discharge: 08/13/2022  Reason for Admission:  Sarah Solis is a 21 year old female with no documented history of psychiatric illness who presented to [facility] via IVC petitioned by her mother Sarah Solis) for auditory hallucinations, paranoia, poor PO and ADLs. Per mother's reports, pt was "normal" x3 months ago where she abruptly quit her job and began isolating herself, she subsequently developed erratic behavior and auditory hallucinations. Pt was transferred from the [facility] to this [facility] for treatment and stabilization of her mood.  Principal Problem: Schizophrenia spectrum disorder with psychotic disorder type not yet determined [facility]) Discharge Diagnoses: Principal Problem:   Schizophrenia spectrum disorder with psychotic disorder type not yet determined (HCC) Active Problems:   Insomnia   Anxiety state  Past Psychiatric History: See H & P  Past Medical History: History reviewed. No pertinent past medical history.  Past Surgical History:  Procedure Laterality Date   INCISION AND DRAINAGE PERIRECTAL ABSCESS Left 10/16/2020   Procedure: IRRIGATION AND DEBRIDEMENT PERIRECTAL ABSCESS;  Surgeon: Abigail Miyamoto, MD;  Location: [facility] OR;  Service: General;  Laterality: Left;   Family History: History reviewed. No pertinent family history. Family Psychiatric  History: none reported Social History:  Social History   Substance and Sexual Activity  Alcohol Use Not Currently     Social History   Substance and Sexual Activity  Drug Use Never    Social History   Socioeconomic History   Marital status: Single    Spouse name: Not on file   Number of children: Not on file   Years of education: Not  on file   Highest education level: Not on file  Occupational History   Not on file  Tobacco Use   Smoking status: Never   Smokeless tobacco: Never  Vaping Use   Vaping Use: Some days  Substance and Sexual Activity   Alcohol use: Not Currently   Drug use: Never   Sexual activity: Not Currently  Other Topics Concern   Not on file  Social History Narrative   Not on file   Social Determinants of Health   Financial Resource Strain: Not on file  Food Insecurity: Not on file  Transportation Needs: Not on file  Physical Activity: Not on file  Stress: Not on file  Social Connections: Not on file                                           HOSPITAL COURSE During the patient's hospitalization, patient had extensive initial psychiatric evaluation, and follow-up psychiatric evaluations every day.  Psychiatric diagnoses provided upon initial assessment were as follows: Schizophreniform disorder (HCC). Diagnoses were subsequently adjusted as follows, after further assessment: Principal Problem: Unspecified schizophrenia spectrum and other psychotic d/o  (r/o MDD with psychotic features, r/o schizophreniform d/o) Active Problems:   Insomnia   Anxiety state   Patient's psychiatric medications were adjusted on admission as follows: -Started Risperdal 0.5 5 mg Q daily for mood control. -Started Risperdal 1 mg po Q bedtime for mood control. -Started Trazodone 50 mg nightly prn for insomnia -Started Hydroxyzine 25 mg po tid prn for  anxiety.  -Started Nicorette gum 2 mg prn for nicotine withdrawal.    During the hospitalization, other adjustments were made to the patient's psychiatric medication regimen, with final medications list at discharge being as follows: -Continue Risperdal 0.5 mg qam and 1.5 mg HS for psychosis/mood control;-Educated on rationales, benefits & possible side effects of medication & verbalized understanding. TSH, lipid panel & hemoglobin A1C WNL. EKG with QTC-450.     -Continue: Mirtazapine to 15 mg po daily for depression/insomnia.    -Continue atarax 10 mg tid prn for anxiety    -Continue Nicotine patch 14 mg trans-dermally Q 24 hours for nicotine withdrawal management.   -Continue Miralax daily from scheduled to prn for constipation   -Continue Vitamin D 50,000 units weekly for Vitamin D deficiency   Patient's care was discussed during the interdisciplinary team meeting every day during the hospitalization. The patient denies having side effects to prescribed psychiatric medication. The patient was evaluated each day by a clinical provider to ascertain response to treatment. Improvement was noted by the patient's report of decreasing symptoms, improved sleep and appetite, affect, medication tolerance, behavior, and participation in unit programming.  Patient was asked each day to complete a self inventory noting mood, mental status, pain, new symptoms, anxiety and concerns.     Symptoms were reported as significantly decreased or resolved completely by discharge. On day of discharge, the patient reports that their mood is stable. The patient denied having suicidal thoughts for more than 48 hours prior to discharge.  Patient denies having homicidal thoughts.  Patient denies having auditory hallucinations.  Patient denies any visual hallucinations or other symptoms of psychosis. The patient was motivated to continue taking medication with a goal of continued improvement in mental health.    The patient reports their target psychiatric symptoms of psychosis & depression responded well to the psychiatric medications, and the patient reports overall benefit from this psychiatric hospitalization. Supportive psychotherapy was provided to the patient. The patient also participated in regular group therapy while hospitalized. Coping skills, problem solving as well as relaxation therapies were also part of the unit programming.   Labs were reviewed with the patient,  and abnormal results were discussed with the patient. Labs this admission are as follows: K-3.3 on admission and repeated 9/3 WNL at 4. TSH, Hemoglobin A1C WNL, CBC WNL, Lipid panel WNL. Urine pregnancy test negative. UA with hazy urine, moderate blood, Sarah leukocytes and rare bacteria; Pt reports that she is currently on her menstrual period, and denies any signs/symptoms of a UTI. STD panel ordered as precaution. Toxicology screen negative. HIV NR, B12 WNL, EKG with QTC-450. First time psychosis labs WNL. ANA-negative, Ceruloplasmin-22.2, RPR (non reactive) (heavy metal, Vitamin B1 WNL) Vitamin D low at 12.89. Supplementing with Vitamin D 50,000 units weekly. Head CT noncontrast shows no acute findings.   Physical Findings: AIMS: Facial and Oral Movements Muscles of Facial Expression: None, normal Lips and Perioral Area: None, normal Jaw: None, normal Tongue: None, normal,Extremity Movements Upper (arms, wrists, hands, fingers): None, normal Lower (legs, knees, ankles, toes): None, normal, Trunk Movements Neck, shoulders, hips: None, normal, Overall Severity Severity of abnormal movements (highest score from questions above): None, normal Incapacitation due to abnormal movements: None, normal Patient's awareness of abnormal movements (rate only patient's report): No Awareness, Dental Status Current problems with teeth and/or dentures?: No Does patient usually wear dentures?: No  CIWA:   n/a COWS:  n/a  AIMS:0  Musculoskeletal: Strength & Muscle Tone: within normal limits Gait &  Station: normal Patient leans: N/A  Psychiatric Specialty Exam:  Presentation  General Appearance: Appropriate for Environment  Eye Contact:Good  Speech:Clear and Coherent  Speech Volume:Normal  Handedness:Right   Mood and Affect  Mood:Euthymic  Affect:Appropriate; Congruent   Thought Process  Thought Processes:Coherent  Descriptions of Associations:Intact  Orientation:Full (Time, Place  and Person)  Thought Content:Logical  History of Schizophrenia/Schizoaffective disorder:No  Duration of Psychotic Symptoms:N/A  Hallucinations:Hallucinations: None  Ideas of Reference:None  Suicidal Thoughts:Suicidal Thoughts: No  Homicidal Thoughts:Homicidal Thoughts: No   Sensorium  Memory:Immediate Good  Judgment:Good  Insight:Good   Executive Functions  Concentration:Good  Attention Span:Good  Recall:Good  Fund of Knowledge:Good  Language:Good   Psychomotor Activity  Psychomotor Activity:Psychomotor Activity: Normal   Assets  Assets:Housing; Social Support   Sleep  Sleep:Sleep: Good    Physical Exam: Physical Exam Constitutional:      Appearance: Normal appearance.  HENT:     Head: Normocephalic.     Nose: Nose normal.  Eyes:     Pupils: Pupils are equal, round, and reactive to light.  Pulmonary:     Effort: Pulmonary effort is normal.  Musculoskeletal:        General: Normal range of motion.     Cervical back: Normal range of motion.  Neurological:     Mental Status: She is alert and oriented to person, place, and time.  Psychiatric:        Behavior: Behavior normal.        Thought Content: Thought content normal.    Review of Systems  Constitutional: Negative.   HENT: Negative.    Eyes: Negative.   Respiratory: Negative.    Cardiovascular: Negative.   Gastrointestinal: Negative.   Genitourinary: Negative.   Musculoskeletal: Negative.   Skin: Negative.   Psychiatric/Behavioral:  Positive for depression (Denies SI/HI/AVH, denies paranoia and there is no evidence of delusional thinking. Verbally contracts for safety outside of this hospital). Negative for hallucinations, memory loss, substance abuse and suicidal ideas. The patient is not nervous/anxious and does not have insomnia.    Blood pressure (!) 102/54, pulse 72, temperature 98.1 F (36.7 C), temperature source Oral, resp. rate 16, height 5\' 5"  (1.651 m), weight 59.9 kg,  SpO2 100 %. Body mass index is 21.97 kg/m.   Social History   Tobacco Use  Smoking Status Never  Smokeless Tobacco Never   Tobacco Cessation:  A prescription for an FDA-approved tobacco cessation medication provided at discharge   Blood Alcohol level:  Lab Results  Component Value Date   ETH <10 08/04/2022    Metabolic Disorder Labs:  Lab Results  Component Value Date   HGBA1C 5.1 08/04/2022   MPG 99.67 08/04/2022   No results found for: "PROLACTIN" Lab Results  Component Value Date   CHOL 144 08/04/2022   TRIG 30 08/04/2022   HDL 35 (L) 08/04/2022   CHOLHDL 4.1 08/04/2022   VLDL 6 08/04/2022   LDLCALC 103 (H) 08/04/2022    See Psychiatric Specialty Exam and Suicide Risk Assessment completed by Attending Physician prior to discharge.  Discharge destination:  Home  Is patient on multiple antipsychotic therapies at discharge:  No   Has Patient had three or more failed trials of antipsychotic monotherapy by history:  No  Recommended Plan for Multiple Antipsychotic Therapies: NA  Discharge Instructions     Diet - low sodium heart healthy   Complete by: As directed    Increase activity slowly   Complete by: As directed  Allergies as of 08/13/2022   No Known Allergies      Medication List     TAKE these medications      Indication  hydrOXYzine 10 MG tablet Commonly known as: ATARAX Take 1 tablet (10 mg total) by mouth 3 (three) times daily as needed for anxiety.  Indication: Feeling Anxious   mirtazapine 15 MG tablet Commonly known as: REMERON Take 1 tablet (15 mg total) by mouth at bedtime.  Indication: Insomnia   nicotine 14 mg/24hr patch Commonly known as: NICODERM CQ - dosed in mg/24 hours Place 1 patch (14 mg total) onto the skin daily for 28 days. Start taking on: August 14, 2022  Indication: Nicotine Addiction   risperiDONE 0.5 MG tablet Commonly known as: RISPERDAL Take 1 tablet (0.5 mg total) by mouth in the morning. What  changed:  medication strength how much to take when to take this  Indication: psychosis   risperiDONE 0.5 MG tablet Commonly known as: RISPERDAL Take 3 tablets (1.5 mg total) by mouth at bedtime. What changed:  how much to take when to take this  Indication: psychosis   Vitamin D (Ergocalciferol) 1.25 MG (50000 UNIT) Caps capsule Commonly known as: DRISDOL Take 1 capsule (50,000 Units total) by mouth every 7 (seven) days for 5 doses. Start taking on: August 16, 2022  Indication: Vitamin D Deficiency        Follow-up Information     [facility]. Go to.   Specialty: Behavioral Health Why: Please go to this provider for an assessment to obtain therapy services on Monday or Wednesday, arrive by 7:30 am.  Services are provided on a first come, first served basis.  You are scheduled for medication management services appointment on 08/26/22 at 1:00 pm. Contact information: [address] [place] [address] [phone]               Follow-up recommendations:    The patient is able to verbalize their individual safety plan to this provider.   # It is recommended to the patient to continue psychiatric medications as prescribed, after discharge from the hospital.     # It is recommended to the patient to follow up with your outpatient psychiatric provider and PCP.   # It was discussed with the patient, the impact of alcohol, drugs, tobacco have been there overall psychiatric and medical wellbeing, and total abstinence from substance use was recommended the patient.ed.   # Prescriptions provided or sent directly to preferred pharmacy at discharge. Patient agreeable to plan. Given opportunity to ask questions. Appears to feel comfortable with discharge.    # In the event of worsening symptoms, the patient is instructed to call the crisis hotline (988), 911 and or go to the nearest ED for appropriate evaluation and treatment of  symptoms. To follow-up with primary care provider for other medical issues, concerns and or health care needs   # Patient was discharged home to her mother with a plan to follow up as noted above.   Signed: Starleen Blue, NP 08/13/2022, 10:43 AM

## 2022-08-13 NOTE — Group Note (Signed)
Recreation Therapy Group Note   Group Topic:Leisure Education  Group Date: 08/13/2022 Start Time: 1100 End Time: 1125 Facilitators: Caroll Rancher, LRT,CTRS Location: [address] Dayroom   Goal Area(s) Addresses:  Patient will successfully identify positive leisure and recreation activities.  Patient will acknowledge benefits of participation in healthy leisure activities post discharge.  Patient will actively work with peers toward a shared goal.   Group Description: Pictionary. In groups of 5-7, patients took turns trying to guess the picture being drawn on the board by their teammate.  If the team guessed the correct answer, they won a point.  If the team guessed wrong, the other team got a chance to steal the point. After several rounds of game play, the team with the most points were declared winners. Post-activity discussion reviewed benefits of positive recreation outlets: reducing stress, improving coping mechanisms, increasing self-esteem, and building larger support systems.   Affect/Mood: Appropriate   Participation Level: Active   Participation Quality: Independent   Behavior: Appropriate   Speech/Thought Process: Focused   Insight: Good   Judgement: Good   Modes of Intervention: Cooperative Play   Patient Response to Interventions:  Engaged   Education Outcome:  Acknowledges education and In group clarification offered    Clinical Observations/Individualized Feedback: Pt was soft spoken but engaged in the activity.  Pt was bright and worked well with peers.  Pt seemed to be enjoying the activity.  Pt stated a few times that she couldn't draw but LRT reassured pt that she didn't have to be [place] to participate in activity.  Pt went on to do well with the activity.  Pt was attentive but didn't participate in processing.       Plan: Continue to engage patient in RT group sessions 2-3x/week.   Caroll Rancher, LRT,CTRS 08/13/2022 12:17 PM

## 2022-08-13 NOTE — Progress Notes (Signed)
  [facility] Adult Case Management Discharge Plan :  Will you be returning to the same living situation after discharge:  Yes,  Home  At discharge, do you have transportation home?: Yes,  Mother  Do you have the ability to pay for your medications: Yes,  Family and community support   Release of information consent forms completed and in the chart;  Patient's signature needed at discharge.  Patient to Follow up at:  Follow-up Information     [facility]. Go to.   Specialty: Behavioral Health Why: Please go to this provider for an assessment to obtain therapy services on Monday or Wednesday, arrive by 7:30 am.  Services are provided on a first come, first served basis.  You are scheduled for medication management services appointment on 08/26/22 at 1:00 pm. Contact information: [address] [place] [address] [phone]                Next level of care provider has access to [facility] Link:yes  Safety Planning and Suicide Prevention discussed: Yes,  with patient and mother      Has patient been referred to the Quitline?: N/A patient is not a smoker  Patient has been referred for addiction treatment: N/A  Aram Beecham, LCSWA 08/13/2022, 9:31 AM

## 2022-08-13 NOTE — Plan of Care (Signed)
Patient was able to identify two coping skill during recreation therapy group sessions at completion of recreation therapy group sessions.    Caroll Rancher, LRT,CTRS

## 2022-08-13 NOTE — Progress Notes (Signed)
Pt left facility to home at this time accompanied by family. Pt removed all belongings, valuables, prescriptions, and discharge instructions and verbalized understanding of medications and follow up appointments. Pt denies SI/HI/self harm thoughts as well as a/v hallucinations.

## 2022-08-13 NOTE — Progress Notes (Signed)
Recreation Therapy Notes  INPATIENT RECREATION TR PLAN  Patient Details Name: Sarah Solis MRN: [id number] DOB: 2001-05-29 Today's Date: 08/13/2022  Rec Therapy Plan Is patient appropriate for Therapeutic Recreation?: Yes Treatment times per week: about 3 days Estimated Length of Stay: 5-7 days TR Treatment/Interventions: Group participation (Comment)  Discharge Criteria Pt will be discharged from therapy if:: Discharged Treatment plan/goals/alternatives discussed and agreed upon by:: Patient/family  Discharge Summary Short term goals set: See patient care plan Short term goals met: Adequate for discharge Progress toward goals comments: Groups attended Which groups?: Leisure education, Other (Comment) (Team Building) Reason goals not met: Only two groups provided Therapeutic equipment acquired: N/A Reason patient discharged from therapy: Discharge from hospital Pt/family agrees with progress & goals achieved: Yes Date patient discharged from therapy: 08/13/22   Victorino Sparrow, Vickki Muff, Raivyn Kabler A 08/13/2022, 12:30 PM

## 2022-08-13 NOTE — [facility] Suicide Risk Assessment (Signed)
Suicide Risk Assessment  Discharge Assessment    [facility] Discharge Suicide Risk Assessment   Principal Problem: Schizophrenia spectrum disorder with psychotic disorder type not yet determined [facility]) Discharge Diagnoses: Principal Problem:   Schizophrenia spectrum disorder with psychotic disorder type not yet determined (HCC) Active Problems:   Insomnia   Anxiety state  Reason for admission: Shalini Mair is a 21 year old female with no documented history of psychiatric illness who presented to [facility] via IVC petitioned by her mother [name]) for auditory hallucinations, paranoia, poor PO and ADLs. Per mother's reports, pt was "normal" x3 months ago where she abruptly quit her job and began isolating herself, she subsequently developed erratic behavior and auditory hallucinations. Pt was transferred from the [facility] to this [facility] for treatment and stabilization of her mood.                                         HOSPITAL COURSE During the patient's hospitalization, patient had extensive initial psychiatric evaluation, and follow-up psychiatric evaluations every day.  Psychiatric diagnoses provided upon initial assessment were as follows: Schizophreniform disorder (HCC). Diagnoses were subsequently adjusted as follows, after further assessment: Principal Problem: Unspecified schizophrenia spectrum and other psychotic d/o  (r/o MDD with psychotic features, r/o schizophreniform d/o) Active Problems:   Insomnia   Anxiety state  Patient's psychiatric medications were adjusted on admission as follows: -Started Risperdal 0.5 5 mg Q daily for mood control. -Started Risperdal 1 mg po Q bedtime for mood control. -Started Trazodone 50 mg nightly prn for insomnia -Started Hydroxyzine 25 mg po tid prn for anxiety.  -Started Nicorette gum 2 mg prn for nicotine withdrawal.   During the hospitalization, other adjustments were made to the patient's psychiatric medication regimen, with final medications  list at discharge being as follows: -Continue Risperdal 0.5 mg qam and 1.5 mg HS for psychosis/mood control;-Educated on rationales, benefits & possible side effects of medication & verbalized understanding. TSH, lipid panel & hemoglobin A1C WNL. EKG with QTC-450.    -Continue: Mirtazapine to 15 mg po daily for depression/insomnia.    -Continue atarax 10 mg tid prn for anxiety    -Continue Nicotine patch 14 mg trans-dermally Q 24 hours for nicotine withdrawal management.   -Continue Miralax daily from scheduled to prn for constipation   -Continue Vitamin D 50,000 units weekly for Vitamin D deficiency  Patient's care was discussed during the interdisciplinary team meeting every day during the hospitalization. The patient denies having side effects to prescribed psychiatric medication. The patient was evaluated each day by a clinical provider to ascertain response to treatment. Improvement was noted by the patient's report of decreasing symptoms, improved sleep and appetite, affect, medication tolerance, behavior, and participation in unit programming.  Patient was asked each day to complete a self inventory noting mood, mental status, pain, new symptoms, anxiety and concerns.    Symptoms were reported as significantly decreased or resolved completely by discharge. On day of discharge, the patient reports that their mood is stable. The patient denied having suicidal thoughts for more than 48 hours prior to discharge.  Patient denies having homicidal thoughts.  Patient denies having auditory hallucinations.  Patient denies any visual hallucinations or other symptoms of psychosis. The patient was motivated to continue taking medication with a goal of continued improvement in mental health.   The patient reports their target psychiatric symptoms of psychosis & depression responded  well to the psychiatric medications, and the patient reports overall benefit from this psychiatric hospitalization.  Supportive psychotherapy was provided to the patient. The patient also participated in regular group therapy while hospitalized. Coping skills, problem solving as well as relaxation therapies were also part of the unit programming.  Labs were reviewed with the patient, and abnormal results were discussed with the patient. Labs this admission are as follows: K-3.3 on admission and repeated 9/3 WNL at 4. TSH, Hemoglobin A1C WNL, CBC WNL, Lipid panel WNL. Urine pregnancy test negative. UA with hazy urine, moderate blood, trace leukocytes and rare bacteria; Pt reports that she is currently on her menstrual period, and denies any signs/symptoms of a UTI. STD panel ordered as precaution. Toxicology screen negative. HIV NR, B12 WNL, EKG with QTC-450. First time psychosis labs WNL. ANA-negative, Ceruloplasmin-22.2, RPR (non reactive) (heavy metal, Vitamin B1 WNL) Vitamin D low at 12.89. Supplementing with Vitamin D 50,000 units weekly. Head CT noncontrast shows no acute findings.  Total Time spent with patient: 30 minutes  Musculoskeletal: Strength & Muscle Tone: within normal limits Gait & Station: normal Patient leans: N/A  Psychiatric Specialty Exam  Presentation  General Appearance: Appropriate for Environment  Eye Contact:Good  Speech:Clear and Coherent  Speech Volume:Normal  Handedness:Right  Mood and Affect  Mood:Euthymic  Duration of Depression Symptoms: Greater than two weeks  Affect:Appropriate; Congruent  Thought Process  Thought Processes:Coherent  Descriptions of Associations:Intact  Orientation:Full (Time, Place and Person)  Thought Content:Logical  History of Schizophrenia/Schizoaffective disorder:No  Duration of Psychotic Symptoms:N/A  Hallucinations:Hallucinations: None  Ideas of Reference:None  Suicidal Thoughts:Suicidal Thoughts: No  Homicidal Thoughts:Homicidal Thoughts: No  Sensorium  Memory:Immediate  Good  Judgment:Good  Insight:Good  Executive Functions  Concentration:Good  Attention Span:Good  Recall:Good  Fund of Knowledge:Good  Language:Good  Psychomotor Activity  Psychomotor Activity:Psychomotor Activity: Normal  Assets  Assets:Housing; Social Support  Sleep  Sleep:Sleep: Good  Physical Exam: Physical Exam Constitutional:      Appearance: Normal appearance.  HENT:     Head: Normocephalic.     Nose: Nose normal.  Eyes:     Pupils: Pupils are equal, round, and reactive to light.  Pulmonary:     Effort: Pulmonary effort is normal.  Musculoskeletal:        General: Normal range of motion.     Cervical back: Normal range of motion.  Neurological:     Mental Status: She is alert and oriented to person, place, and time.  Psychiatric:        Mood and Affect: Mood normal.        Behavior: Behavior normal.    Review of Systems  Constitutional: Negative.   HENT: Negative.    Gastrointestinal: Negative.   Genitourinary: Negative.   Musculoskeletal: Negative.   Skin: Negative.   Neurological: Negative.   Psychiatric/Behavioral:  Positive for depression (denies SI/HI/AVH, denies paranoia, no delusional thinking and verbally contracts for safety outside of the hospital). Negative for hallucinations, memory loss, substance abuse and suicidal ideas. The patient is not nervous/anxious and does not have insomnia.    Blood pressure (!) 102/54, pulse 72, temperature 98.1 F (36.7 C), temperature source Oral, resp. rate 16, height 5\' 5"  (1.651 m), weight 59.9 kg, SpO2 100 %. Body mass index is 21.97 kg/m.  Mental Status Per Nursing Assessment::   On Admission:  NA  Demographic Factors:  Low socioeconomic status  Loss Factors: NA  Historical Factors: NA  Risk Reduction Factors:   Living with another person, especially a  relative and Positive social support  Continued Clinical Symptoms:  Patient reports that her depressive symptoms have significantly  reduced. She reports that her psychosis has resolved. There is no evidence of delusional thinking. She is denying SI, denies HI, denies AVH, denies paranoia and is verbally contracting for safety outside of this hospital.  Cognitive Features That Contribute To Risk:  None    Suicide Risk:  Minimal: No identifiable suicidal ideation.  Patients presenting with no risk factors but with morbid ruminations; may be classified as minimal risk based on the severity of the depressive symptoms   Follow-up Information     [facility]. Go to.   Specialty: Behavioral Health Why: Please go to this provider for an assessment to obtain therapy services on Monday or Wednesday, arrive by 7:30 am.  Services are provided on a first come, first served basis.  You are scheduled for medication management services appointment on 08/26/22 at 1:00 pm. Contact information: [address] [place] [address] [phone]               Plan Of Care/Follow-up recommendations:  The patient is able to verbalize their individual safety plan to this provider.  # It is recommended to the patient to continue psychiatric medications as prescribed, after discharge from the hospital.    # It is recommended to the patient to follow up with your outpatient psychiatric provider and PCP.  # It was discussed with the patient, the impact of alcohol, drugs, tobacco have been there overall psychiatric and medical wellbeing, and total abstinence from substance use was recommended the patient.ed.  # Prescriptions provided or sent directly to preferred pharmacy at discharge. Patient agreeable to plan. Given opportunity to ask questions. Appears to feel comfortable with discharge.    # In the event of worsening symptoms, the patient is instructed to call the crisis hotline (988), 911 and or go to the nearest ED for appropriate evaluation and treatment of symptoms. To follow-up with primary care  provider for other medical issues, concerns and or health care needs  # Patient was discharged home to her mother with a plan to follow up as noted above.   Starleen Blue, NP 08/13/2022, 10:24 AM

## 2022-08-13 NOTE — Progress Notes (Addendum)
[facility] Group Notes:  (Nursing/MHT/Case Management/Adjunct)  Date:  08/13/2022  Time:  3:08 AM  Type of Therapy:   Wrap up group  Participation Level:  Did Not Attend  Participation Quality:    Affect:    Cognitive:    Insight:    Engagement in Group:    Modes of Intervention:    Summary of Progress/Problems:  Jola Baptist 08/13/2022, 3:08 AM

## 2022-08-17 ENCOUNTER — Ambulatory Visit (HOSPITAL_COMMUNITY)
Admission: EM | Admit: 2022-08-17 | Discharge: 2022-08-18 | Disposition: A | Discharge status: Home / Self Care | Payer: No Payment, Other | Attending: Behavioral Health | Admitting: Behavioral Health

## 2022-08-17 ENCOUNTER — Ambulatory Visit (HOSPITAL_COMMUNITY)
Admission: RE | Admit: 2022-08-17 | Discharge: 2022-08-17 | Disposition: A | Discharge status: Home / Self Care | Payer: Self-pay | Attending: Psychiatry | Admitting: Psychiatry

## 2022-08-17 DIAGNOSIS — F29 Unspecified psychosis not due to a substance or known physiological condition: Secondary | ICD-10-CM

## 2022-08-17 DIAGNOSIS — F209 Schizophrenia, unspecified: Secondary | ICD-10-CM | POA: Insufficient documentation

## 2022-08-17 DIAGNOSIS — Z20822 Contact with and (suspected) exposure to covid-19: Secondary | ICD-10-CM | POA: Insufficient documentation

## 2022-08-17 LAB — POCT URINE DRUG SCREEN - MANUAL ENTRY (I-SCREEN)
POC Amphetamine UR: NOT DETECTED
POC Buprenorphine (BUP): NOT DETECTED
POC Cocaine UR: NOT DETECTED
POC Marijuana UR: NOT DETECTED
POC Methadone UR: NOT DETECTED
POC Methamphetamine UR: NOT DETECTED
POC Morphine: NOT DETECTED
POC Oxazepam (BZO): NOT DETECTED
POC Oxycodone UR: NOT DETECTED
POC Secobarbital (BAR): NOT DETECTED

## 2022-08-17 LAB — CBC WITH DIFFERENTIAL/PLATELET
Abs Immature Granulocytes: 0.02 10*3/uL (ref 0.00–0.07)
Basophils Absolute: 0 10*3/uL (ref 0.0–0.1)
Basophils Relative: 0 %
Eosinophils Absolute: 0 10*3/uL (ref 0.0–0.5)
Eosinophils Relative: 0 %
HCT: 36.5 % (ref 36.0–46.0)
Hemoglobin: 11.4 g/dL — ABNORMAL LOW (ref 12.0–15.0)
Immature Granulocytes: 0 %
Lymphocytes Relative: 24 %
Lymphs Abs: 1.7 10*3/uL (ref 0.7–4.0)
MCH: 26.7 pg (ref 26.0–34.0)
MCHC: 31.2 g/dL (ref 30.0–36.0)
MCV: 85.5 fL (ref 80.0–100.0)
Monocytes Absolute: 0.3 10*3/uL (ref 0.1–1.0)
Monocytes Relative: 4 %
Neutro Abs: 4.9 10*3/uL (ref 1.7–7.7)
Neutrophils Relative %: 72 %
Platelets: 260 10*3/uL (ref 150–400)
RBC: 4.27 MIL/uL (ref 3.87–5.11)
RDW: 13.5 % (ref 11.5–15.5)
WBC: 7 10*3/uL (ref 4.0–10.5)
nRBC: 0 % (ref 0.0–0.2)

## 2022-08-17 LAB — COMPREHENSIVE METABOLIC PANEL
ALT: 34 U/L (ref 0–44)
AST: 23 U/L (ref 15–41)
Albumin: 3.9 g/dL (ref 3.5–5.0)
Alkaline Phosphatase: 36 U/L — ABNORMAL LOW (ref 38–126)
Anion gap: 7 (ref 5–15)
BUN: 10 mg/dL (ref 6–20)
CO2: 25 mmol/L (ref 22–32)
Calcium: 9.6 mg/dL (ref 8.9–10.3)
Chloride: 107 mmol/L (ref 98–111)
Creatinine, Ser: 0.54 mg/dL (ref 0.44–1.00)
GFR, Estimated: >60 mL/min (ref >60)
Glucose, Bld: 87 mg/dL (ref 70–99)
Potassium: 4 mmol/L (ref 3.5–5.1)
Sodium: 139 mmol/L (ref 135–145)
Total Bilirubin: 0.4 mg/dL (ref 0.3–1.2)
Total Protein: 7.2 g/dL (ref 6.5–8.1)

## 2022-08-17 LAB — RESP PANEL BY RT-PCR (FLU A&B, COVID) ARPGX2
Influenza A by PCR: NEGATIVE
Influenza B by PCR: NEGATIVE
SARS Coronavirus 2 by RT PCR: NEGATIVE

## 2022-08-17 LAB — ETHANOL: Alcohol, Ethyl (B): <10 mg/dL (ref <10)

## 2022-08-17 LAB — POC SARS CORONAVIRUS 2 AG: SARSCOV2ONAVIRUS 2 AG: NEGATIVE

## 2022-08-17 LAB — POCT PREGNANCY, URINE: Preg Test, Ur: NEGATIVE

## 2022-08-17 MED ORDER — MIRTAZAPINE 15 MG PO TABS
15.0000 mg | ORAL_TABLET | Freq: Every day | ORAL | Status: DC
  Administered 2022-08-17: 15 mg via ORAL
  Filled 2022-08-17 (×2): qty 7
  Filled 2022-08-17: qty 1

## 2022-08-17 MED ORDER — NICOTINE 14 MG/24HR TD PT24
14.0000 mg | MEDICATED_PATCH | Freq: Every day | TRANSDERMAL | Status: DC
  Administered 2022-08-17: 14 mg via TRANSDERMAL
  Filled 2022-08-17 (×2): qty 1

## 2022-08-17 MED ORDER — MAGNESIUM HYDROXIDE 400 MG/5ML PO SUSP: 30.0000 mL | Freq: Every day | ORAL | Status: DC | PRN

## 2022-08-17 MED ORDER — RISPERIDONE 1 MG PO TABS
1.5000 mg | ORAL_TABLET | Freq: Every day | ORAL | Status: DC
  Administered 2022-08-17: 1.5 mg via ORAL
  Filled 2022-08-17: qty 1

## 2022-08-17 MED ORDER — ACETAMINOPHEN 325 MG PO TABS: 650.0000 mg | ORAL_TABLET | Freq: Four times a day (QID) | ORAL | Status: DC | PRN

## 2022-08-17 MED ORDER — ALUM & MAG HYDROXIDE-SIMETH 200-200-20 MG/5ML PO SUSP: 30.0000 mL | ORAL | Status: DC | PRN

## 2022-08-17 MED ORDER — RISPERIDONE 0.5 MG PO TABS
0.5000 mg | ORAL_TABLET | Freq: Every morning | ORAL | Status: DC
  Administered 2022-08-17 – 2022-08-18 (×2): 0.5 mg via ORAL
  Filled 2022-08-17 (×2): qty 1

## 2022-08-17 MED ORDER — HYDROXYZINE HCL 25 MG PO TABS
25.0000 mg | ORAL_TABLET | Freq: Three times a day (TID) | ORAL | Status: DC | PRN
  Administered 2022-08-17 – 2022-08-18 (×2): 25 mg via ORAL
  Filled 2022-08-17 (×2): qty 1

## 2022-08-17 NOTE — Progress Notes (Signed)
Pt is admitted to Continuous observation due to paranoia and delusional thoughts. Pt alert and oriented X3. Pt was cooperative with labs and admission process. Pt is ambulatory and is oriented staff/unit. Pt denies pain and current SI/HI/AVH. Staff will monitor for pt's safety.
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
## 2022-08-17 NOTE — ED Notes (Signed)
Ms Lundy reports having anxiety and request medication Atarax 25 mg po given.

## 2022-08-17 NOTE — BH Assessment (Signed)
Comprehensive Clinical Assessment (CCA) Screening, Triage and Referral Note  08/17/2022 Sarah Solis [id number]  Disposition: Per Liborio Nixon, NP patient recommended for overnight observation.  Flowsheet Row [facility] Admission (Discharged) from 08/05/2022 in [facility] INPATIENT ADULT [facility]B ED from 08/04/2022 in [facility]  C-SSRS RISK CATEGORY No Risk No Risk No Risk      The patient demonstrates the following risk factors for suicide: Chronic risk factors for suicide include: psychiatric disorder of schizophrenia . Acute risk factors for suicide include: recent discharge from inpatient psychiatry. Protective factors for this patient include: positive social support. Considering these factors, the overall suicide risk at this point appears to be low. Patient is not appropriate for outpatient follow up.  Sarah Solis is a 21 year old female presenting to [facility] voluntarily initially, however, during triage patient was served with IVC papers. Per IVC "Respondent is diagnosed with schizophrenia. Is highly paranoid and having hallucinations. Not sleeping at all, constantly checking doors and windows throughout her home. Stated to family members that she is hearing noises and needs help. This morning the respondent did not allow her sister to get on her school bus and then walked away from her residence. Respondents mother found her walking on [employer] claiming she was walking to the hospital. Respondent tried to get out of the moving car while going to the hospital with her mother. Has been committed before. Is a danger to herself."    Patient reports she is hearing voices in her house, and she thinks that people are coming in and out her room through her window and she has called the police before to catch the person. Patient reports that people are after her family because they think that her  family has a lot of money. Patient reports she has not slept in 24. Patient reports that her family voices sound like they are robots, and she feels like something is wrong with her family because they are not talking to her.   Patient was discharged from [facility] on 08/13/22 with a diagnosis of schizophrenia. Patient reports that she has not followed up with outpatient services since being discharged a few days ago. Patient denies alcohol and drug use and she does not have any legal issues. Patient lives with her mom, stepdad and younger sister. Patient is not working and reports she can't get a job because she feels like people will try to attack her. Patient is single with no kids. Patient denies access to a firearm.  Patient is oriented x3, engaged, alert and cooperative. Patient speech is clear but low in town, her eye contact is normal, and she presents with paranoid thoughts. Patient denies SI, HI, AVH and she does not appear to be responding to internal stimuli during assessment. Patient consents to talk with her mom who reports that patient has been progressively getting worse for the past six months and when she presented to the hospital on 08/04/22 patient was presenting very paranoid. Mom reports patient did not sleep last night, and she does not think patient is taking her medications. Mom reports that patient dad is diagnosed with schizophrenia. Mom reports that this morning patient was trying to stop her sister from getting on the school bus due to paranoid thoughts. Mom report she found patient walking down [address] and patient told her mom that she needed to go back to the hospital. Mom reports talking patient to [facility] and today but they  wouldn't keep her so she brought patient to [facility] and IVC'd her.       Chief Complaint:  Chief Complaint  Patient presents with   IVC   Visit Diagnosis: Schizophrenia spectrum disorder with psychotic disorder type not yet determined [facility])  Patient Reported  Information How did you hear about [place]? Self  What Is the Reason for Your Visit/Call Today? IVC for paranoid delusions and AH.  How Long Has This Been Causing You Problems? 1 wk - 1 month  What Do You Feel Would Help You the Most Today? Treatment for Depression or other mood problem   Have You Recently Had Any Thoughts About Hurting Yourself? No  Are You Planning to Commit Suicide/Harm Yourself At This time? No   Have you Recently Had Thoughts About Hurting Someone [name]? No  Are You Planning to Harm Someone at This Time? No  Explanation: No data recorded  Have You Used Any Alcohol or Drugs in the Past 24 Hours? No  How Long Ago Did You Use Drugs or Alcohol? No data recorded What Did You Use and How Much? No data recorded  Do You Currently Have a Therapist/Psychiatrist? No  Name of Therapist/Psychiatrist: Pt has not established care with a provider yet   Have You Been Recently Discharged From Any Office Practice or Programs? Yes  Explanation of Discharge From Practice/Program: Discharged from [facility] on 08/13/22    CCA Screening Triage Referral Assessment Type of Contact: Face-to-Face  Telemedicine Service Delivery:   Is this Initial or Reassessment? No data recorded Date Telepsych consult ordered in [facility]:  No data recorded Time Telepsych consult ordered in [facility]:  No data recorded Location of Assessment: [facility] Assessment Services  Provider Location: [facility]   Collateral Involvement: TTS spoke to pt mother about IVC   Does Patient Have a Automotive engineer Guardian? No data recorded Name and Contact of Legal Guardian: No data recorded If Minor and Not Living with Parent(s), Who has Custody? N/A  Is CPS involved or ever been involved? -- (Unknown)  Is APS involved or ever been involved? -- (Unknown)   Patient Determined To Be At Risk for Harm To Self or Others Based on Review of Patient Reported Information or Presenting Complaint? No  Method:  No data recorded Availability of Means: No data recorded Intent: No data recorded Notification Required: No data recorded Additional Information for Danger to Others Potential: No data recorded Additional Comments for Danger to Others Potential: No data recorded Are There Guns or Other Weapons in Your Home? No data recorded Types of Guns/Weapons: No data recorded Are These Weapons Safely Secured?                            No data recorded Who Could Verify You Are Able To Have These Secured: No data recorded Do You Have any Outstanding Charges, Pending Court Dates, Parole/Probation? No data recorded Contacted To Inform of Risk of Harm To Self or Others: -- (N/A)   Does Patient Present under Involuntary Commitment? Yes  IVC Papers Initial File Date: 08/17/22   [place] of Residence: [place]   Patient Currently Receiving the Following Services: Not Receiving Services   Determination of Need: Urgent (48 hours)   Options For Referral: Medication Management; Outpatient Therapy; BH Urgent Care; Inpatient Hospitalization   Discharge Disposition: Per Liborio Nixon, NP patient recommended for overnight observation.    Delorus Langwell Shirlee More, [facility]

## 2022-08-17 NOTE — ED Notes (Signed)
Pt is resting in bed quietly given snack as requested . No self injurious behavior noted affect is flat but brightens on approach mood is quiet.

## 2022-08-17 NOTE — ED Provider Notes (Signed)
[facility] Continuous Assessment Admission H&P  Date: 08/17/22 Patient Name: Sarah Solis MRN: [id number] Chief Complaint:  Chief Complaint  Patient presents with   IVC      Diagnoses:  Final diagnoses:  Schizophrenia spectrum disorder with psychotic disorder type not yet determined [facility])    HPI: [name] is a 21 year old female patient with a newly past psychiatric history of schizophreniform versus schizophrenia who presents to the [facility] behavioral health urgent care initially voluntary but was later involuntarily committed by her mother for an evaluation.  Per IVC:  "Respondent is diagnosed with schizophrenia. Is highly paranoid and having hallucinations. Not sleeping at all, constantly checking doors and windows throughout her home. Stated to family members that she is hearing noises and needs help. This morning the respondent did not allow her sister to get on her school bus and then walked away from her residence. Respondents mother found her walking on [employer] claiming she was walking to the hospital. Respondent tried to get out of the moving car while going to the hospital with her mother. Has been committed before. Is a danger to herself."    Patient seen and evaluated face-to-face by this provider, and chart reviewed. Per chart review, patient was hospitalized at [facility] from 08/05/22 to 08/14/2022 for psychosis. During patient's encounter at the [facility], she was prescribed Remeron 15 mg p.o. nightly, restart Risperdal 0.5 mg p.o. daily, risperidone 1.5 mg p.o. nightly and Vistaril 10 mg p.o. 3 times daily as needed and vitamin D daily x 7 days.  On evaluation, patient is alert and oriented x 3. Her thought process is relevant with paranoia and delusional thought content. Her speech is coherent. Her mood is dysphoric and affect is congruent. She is calm and cooperative at this time.  Patient tells me that she cannot  sleep. She states that she was taking sleeping medication when she was in the hospital but they did not give it to her when she left the hospital. She states that she went to the pharmacy to get her medications but they told her that she did not have medicaid and that she cannot apply for medicaid because she is not pregnant and will only receive preventative care medicaid. She states that she was given a discount code to help get her medications but then she lost her hospital papers.  She states that she has not slept since yesterday. She states that yesterday she slept for 3 hours. She states that she is not able to sleep because people are coming through her and her mother's windows. She states that everybody in the house sounds like robots and are acting weird. She states that everything that she sees is right. She states, "I do not have a disability."  She states that she hears voices of people whispering in her mother's is back room but her mother is afraid to speak up. She states that she feels like she is being watch and it does not stop. She states that she is being watched by cameras.  She denies suicidal ideations. She denies homicidal ideations. She states that she does not want to hurt people but people are hurting her mother and her sister. She denies auditory and visual hallucinations.  However, she does present with delusional and paranoid thought content. She reports a fair appetite. She denies drinking alcohol or using illicit drugs.  She states that she resides with her mother, 2 sisters, brother, and her mother's  husband. She states that she was supposed to follow-up with outpatient therapy but she lost her hospital papers. She denies a medical history but states that sometimes her blood pressure is low. She reports a family psychiatric history of "father may have bipolar." She states that she has been compliant with her medications and reports taking Risperdal and hydroxyzine. She denies  medication side effects. She denies physical complaints at this time.   PHQ 2-9:   Flowsheet Row OP [facility] Admission (Discharged) from [facility] from 08/04/2022 in [facility]  C-SSRS RISK CATEGORY No Risk No Risk No Risk        Total Time spent with patient: 45 minutes  Musculoskeletal  Strength & Muscle Tone: within normal limits Gait & Station: normal Patient leans: N/A  Psychiatric Specialty Exam  Presentation General Appearance: Appropriate for Environment  Eye Contact:Fair  Speech:Clear and Coherent  Speech Volume:Normal  Handedness:Right   Mood and Affect  Mood:Dysphoric  Affect:Congruent   Thought Process  Thought Processes:Coherent  Descriptions of Associations:Circumstantial  Orientation:Full (Time, Place and Person)  Thought Content:Paranoid Ideation  Diagnosis of Schizophrenia or Schizoaffective disorder in past: Yes  Duration of Psychotic Symptoms: Less than six months  Hallucinations:Hallucinations: Auditory Description of Auditory Hallucinations: Not applicable  Ideas of Reference:Paranoia; Delusions  Suicidal Thoughts:Suicidal Thoughts: No SI Passive Intent and/or Plan: -- (Not applicable)  Homicidal Thoughts:Homicidal Thoughts: No   Sensorium  Memory:Immediate Fair; Recent Fair; Remote Fair  Judgment:Impaired  Insight:Poor   Executive Functions  Concentration:Fair  Attention Span:Fair  Recall:Fair  Fund of Knowledge:Fair  Language:Fair   Psychomotor Activity  Psychomotor Activity:Psychomotor Activity: Normal   Assets  Assets:Communication Skills; Desire for Improvement; Leisure Time; Physical Health; Social Support   Sleep  Sleep:Sleep: Poor Number of Hours of Sleep: 3   Nutritional Assessment (For OBS and FBC admissions only) Has the patient had a weight loss or gain of  10 pounds or more in the last 3 months?: No Has the patient had a decrease in food intake/or appetite?: No Does the patient have dental problems?: No Does the patient have eating habits or behaviors that may be indicators of an eating disorder including binging or inducing vomiting?: No Has the patient recently lost weight without trying?: 0 Has the patient been eating poorly because of a decreased appetite?: 0 Malnutrition Screening Tool Score: 0    Physical Exam HENT:     Head: Normocephalic.     Right Ear: External ear normal.     Left Ear: External ear normal.  Eyes:     Conjunctiva/sclera: Conjunctivae normal.  Cardiovascular:     Rate and Rhythm: Normal rate.  Pulmonary:     Effort: Pulmonary effort is normal.  Musculoskeletal:     Cervical back: Normal range of motion.  Neurological:     Mental Status: She is alert and oriented to person, place, and time.    Review of Systems  Constitutional: Negative.   HENT: Negative.    Eyes: Negative.   Respiratory: Negative.    Cardiovascular: Negative.   Gastrointestinal: Negative.   Genitourinary: Negative.   Musculoskeletal: Negative.   Neurological: Negative.   Endo/Heme/Allergies: Negative.     Blood pressure 121/83, pulse 92, temperature 98.4 F (36.9 C), temperature source Oral, resp. rate 18, SpO2 100 %. There is no height or weight on file to calculate BMI.  Past Psychiatric History: history of psychosis, and schizophreniform  disorder. Hospitalized at [facility] 08/05/22 to 08/14/22.   Is the patient at risk to self? No  Has the patient been a risk to self in the past 6 months? No .    Has the patient been a risk to self within the distant past? No   Is the patient a risk to others? No   Has the patient been a risk to others in the past 6 months? No   Has the patient been a risk to others within the distant past? No   Past Medical History: No past medical history on file.  Past Surgical History:  Procedure  Laterality Date   INCISION AND DRAINAGE PERIRECTAL ABSCESS Left 10/16/2020   Procedure: IRRIGATION AND DEBRIDEMENT PERIRECTAL ABSCESS;  Surgeon: Abigail Miyamoto, MD;  Location: [facility] OR;  Service: General;  Laterality: Left;    Family History: No family history on file.  Social History:  Social History   Socioeconomic History   Marital status: Single    Spouse name: Not on file   Number of children: Not on file   Years of education: Not on file   Highest education level: Not on file  Occupational History   Not on file  Tobacco Use   Smoking status: Never   Smokeless tobacco: Never  Vaping Use   Vaping Use: Some days  Substance and Sexual Activity   Alcohol use: Not Currently   Drug use: Never   Sexual activity: Not Currently  Other Topics Concern   Not on file  Social History Narrative   Not on file   Social Determinants of Health   Financial Resource Strain: Not on file  Food Insecurity: Not on file  Transportation Needs: Not on file  Physical Activity: Not on file  Stress: Not on file  Social Connections: Not on file  Intimate Partner Violence: Not on file    SDOH:  SDOH Screenings   Alcohol Screen: Low Risk  (08/05/2022)  Tobacco Use: Low Risk  (08/05/2022)    Last Labs:  Admission on 08/05/2022, Discharged on 08/13/2022  Component Date Value Ref Range Status   RPR Ser Ql 08/08/2022 NON REACTIVE  NON REACTIVE Final   Performed at [facility], [address] [place], [place] [address]   HIV Screen 4th Generation wRfx 08/08/2022 Non Reactive  Non Reactive Final   Performed at [facility], [address] [place], [place] [address]   Anti Nuclear Antibody (ANA) 08/08/2022 Negative  Negative Final   Comment: (NOTE) Performed At: [facility] [address] [place], [place] [id number] Jolene Schimke MD LY:[id number]    Vitamin B1 (Thiamine) 08/08/2022 68.3  66.5 - 200.0 nmol/L Final   Comment: (NOTE) This test was developed and its  performance characteristics determined by [facility]. It has not been cleared or approved by the Food and Drug Administration. Performed At: [facility] [address] [place], [place] [id number] Jolene Schimke MD VC:[id number]    Vitamin B-12 08/08/2022 396  180 - 914 pg/mL Final   Comment: (NOTE) This assay is not validated for testing neonatal or myeloproliferative syndrome specimens for Vitamin B12 levels. Performed at [facility], [address] [place], [place] [address]    Color, Urine 08/08/2022 YELLOW  YELLOW Final   APPearance 08/08/2022 HAZY (A)  CLEAR Final   Specific Gravity, Urine 08/08/2022 1.020  1.005 - 1.030 Final   pH 08/08/2022 6.0  5.0 - 8.0 Final   Glucose, UA 08/08/2022 NEGATIVE  NEGATIVE  mg/dL Final   Hgb urine dipstick 08/08/2022 MODERATE (A)  NEGATIVE Final   Bilirubin Urine 08/08/2022 NEGATIVE  NEGATIVE Final   Ketones, ur 08/08/2022 NEGATIVE  NEGATIVE mg/dL Final   Protein, ur 63/84/6659 NEGATIVE  NEGATIVE mg/dL Final   Nitrite 93/57/0177 NEGATIVE  NEGATIVE Final   Leukocytes,Ua 08/08/2022 TRACE (A)  NEGATIVE Final   RBC / HPF 08/08/2022 0-5  0 - 5 RBC/hpf Final   WBC, UA 08/08/2022 0-5  0 - 5 WBC/hpf Final   Bacteria, UA 08/08/2022 RARE (A)  NONE SEEN Final   Squamous Epithelial / LPF 08/08/2022 0-5  0 - 5 Final   Mucus 08/08/2022 PRESENT   Final   Performed at [facility], [address] [place], [place] [address]   Vit D, 25-Hydroxy 08/09/2022 12.89 (L)  30 - 100 ng/mL Final   Comment: (NOTE) Vitamin D deficiency has been defined by the Institute of Medicine  and an Endocrine Society practice guideline as a level of serum 25-OH  vitamin D less than 20 ng/mL (1,2). The Endocrine Society went on to  further define vitamin D insufficiency as a level between 21 and 29  ng/mL (2).  1. IOM (Institute of Medicine). 2010. Dietary reference intakes for  calcium and D. [place]: [employer]. 2. Holick MF, Binkley Furman, Bischoff-Ferrari HA, et al. Evaluation,  treatment, and prevention of vitamin D deficiency: an Endocrine  Society clinical practice guideline, JCEM. 2011 Jul; 96(7): 1911-30.  Performed at [facility], [address] [place], [place] [address]    Arsenic 08/08/2022 2  0 - 9 ug/L Final   Comment: (NOTE) This test was developed and its performance characteristics determined by [facility]. It has not been cleared or approved by the Food and Drug Administration.                                Detection Limit = 1    Mercury 08/08/2022 1.4  0.0 - 14.9 ug/L Final   Comment: (NOTE) This test was developed and its performance characteristics determined by [facility]. It has not been cleared or approved by the Food and Drug Administration.                        Environmental Exposure:  <15.0                        Occupational Exposure:                         BEI - Inorganic Mercury: 15.0                                Detection Limit =  1.0 Performed At: [employer] [address] [place], [place] [id number] Jolene Schimke MD FH:[id number]    Lead 08/08/2022 <1.0  0.0 - 3.4 ug/dL Final   Comment: (NOTE) Testing performed by Inductively coupled Administrator, Civil Service. This test was developed and its performance characteristics determined by [facility]. It has not been cleared or approved by the Food and Drug Administration.                          Environmental Exposure:  WHO Recommendation     <5.0                          Occupational Exposure:                           OSHA Lead Std          40.0                           BEI                    30.0                                Detection Limit =  1.0    Anti Nuclear Antibody (ANA) 08/09/2022 Negative  Negative Final   Comment: (NOTE) Performed At: [facility] [place] [address] [place], [place] [id number] Jolene SchimkeNagendra Sanjai MD WU:[phone]     Ceruloplasmin 08/09/2022 22.2  19.0 - 39.0 mg/dL Final   Comment: (NOTE) Performed At: [facility] [place] [address] [place], [place] [id number] Jolene SchimkeNagendra Sanjai MD VH:[phone]    RPR Ser Ql 08/09/2022 NON REACTIVE  NON REACTIVE Final   Performed at [facility], [address] [place], [place] [id number]   Vitamin B1 (Thiamine) 08/09/2022 68.2  66.5 - 200.0 nmol/L Final   Comment: (NOTE) This test was developed and its performance characteristics determined by [facility]. It has not been cleared or approved by the Food and Drug Administration. Performed At: [facility] [place] [address] [place], [place] [id number] Jolene SchimkeNagendra Sanjai MD GU:[phone]    Sodium 08/09/2022 142  135 - 145 mmol/L Final   Potassium 08/09/2022 4.0  3.5 - 5.1 mmol/L Final   Chloride 08/09/2022 110  98 - 111 mmol/L Final   CO2 08/09/2022 27  22 - 32 mmol/L Final   Glucose, Bld 08/09/2022 88  70 - 99 mg/dL Final   Glucose reference range applies only to samples taken after fasting for at least 8 hours.   BUN 08/09/2022 12  6 - 20 mg/dL Final   Creatinine, Ser 08/09/2022 0.59  0.44 - 1.00 mg/dL Final   Calcium 56/38/756409/02/2022 9.1  8.9 - 10.3 mg/dL Final   Total Protein 33/29/518809/02/2022 6.7  6.5 - 8.1 g/dL Final   Albumin 41/66/063009/02/2022 3.9  3.5 - 5.0 g/dL Final   AST 16/01/093209/02/2022 13 (L)  15 - 41 U/L Final   ALT 08/09/2022 11  0 - 44 U/L Final   Alkaline Phosphatase 08/09/2022 36 (L)  38 - 126 U/L Final   Total Bilirubin 08/09/2022 0.3  0.3 - 1.2 mg/dL Final   GFR, Estimated 08/09/2022 >60  >60 mL/min Final   Comment: (NOTE) Calculated using the CKD-EPI Creatinine Equation (2021)    Anion gap 08/09/2022 5  5 - 15 Final   Performed at [facility], [address] [place], [place] [address]   Neisseria Gonorrhea 08/11/2022 Negative   Final   Chlamydia 08/11/2022 Negative   Final   Comment 08/11/2022 Normal Reference Ranger Chlamydia - Negative   Final   Comment 08/11/2022 Normal Reference  Range Neisseria Gonorrhea - Negative   Final  Admission on 08/04/2022, Discharged on 08/05/2022  Component Date Value Ref Range Status   SARS Coronavirus 2 by RT PCR 08/04/2022 NEGATIVE  NEGATIVE Final   Comment: (NOTE) SARS-CoV-2 target nucleic acids are NOT DETECTED.  The SARS-CoV-2 RNA is generally detectable in upper respiratory specimens during the acute phase of infection. The lowest concentration of SARS-CoV-2 viral copies this assay can detect is 138 copies/mL. A negative result does not preclude SARS-Cov-2 infection and should not be used as the sole basis for treatment or other patient management decisions. A negative result may occur with  improper specimen collection/handling, submission of specimen other than nasopharyngeal swab, presence of viral mutation(s) within the areas targeted by this assay, and inadequate number of viral copies(<138 copies/mL). A negative result must be combined with clinical observations, patient history, and epidemiological information. The expected result is Negative.  Fact Sheet for Patients:  BloggerCourse.com  Fact Sheet for Healthcare Providers:  SeriousBroker.it  This test is no                          t yet approved or cleared by the [place] FDA and  has been authorized for detection and/or diagnosis of SARS-CoV-2 by FDA under an Emergency Use Authorization (EUA). This EUA will remain  in effect (meaning this test can be used) for the duration of the COVID-19 declaration under Section 564(b)(1) of the Act, 21 U.S.C.section 360bbb-3(b)(1), unless the authorization is terminated  or revoked sooner.       Influenza A by PCR 08/04/2022 NEGATIVE  NEGATIVE Final   Influenza B by PCR 08/04/2022 NEGATIVE  NEGATIVE Final   Comment: (NOTE) The Xpert Xpress SARS-CoV-2/FLU/RSV plus assay is intended as an aid in the diagnosis of influenza from Nasopharyngeal swab specimens and should  not be used as a sole basis for treatment. Nasal washings and aspirates are unacceptable for Xpert Xpress SARS-CoV-2/FLU/RSV testing.  Fact Sheet for Patients: BloggerCourse.com  Fact Sheet for Healthcare Providers: SeriousBroker.it  This test is not yet approved or cleared by the [place] FDA and has been authorized for detection and/or diagnosis of SARS-CoV-2 by FDA under an Emergency Use Authorization (EUA). This EUA will remain in effect (meaning this test can be used) for the duration of the COVID-19 declaration under Section 564(b)(1) of the Act, 21 U.S.C. section 360bbb-3(b)(1), unless the authorization is terminated or revoked.  Performed at [facility], [address] [place], [place] [address]    WBC 08/04/2022 7.8  4.0 - 10.5 K/uL Final   RBC 08/04/2022 4.56  3.87 - 5.11 MIL/uL Final   Hemoglobin 08/04/2022 12.6  12.0 - 15.0 g/dL Final   HCT 81/82/9937 37.8  36.0 - 46.0 % Final   MCV 08/04/2022 82.9  80.0 - 100.0 fL Final   MCH 08/04/2022 27.6  26.0 - 34.0 pg Final   MCHC 08/04/2022 33.3  30.0 - 36.0 g/dL Final   RDW 16/96/7893 12.6  11.5 - 15.5 % Final   Platelets 08/04/2022 276  150 - 400 K/uL Final   nRBC 08/04/2022 0.0  0.0 - 0.2 % Final   Neutrophils Relative % 08/04/2022 72  % Final   Neutro Abs 08/04/2022 5.6  1.7 - 7.7 K/uL Final   Lymphocytes Relative 08/04/2022 21  % Final   Lymphs Abs 08/04/2022 1.6  0.7 - 4.0 K/uL Final   Monocytes Relative 08/04/2022 5  % Final   Monocytes Absolute 08/04/2022 0.4  0.1 - 1.0 K/uL Final   Eosinophils Relative 08/04/2022 1  % Final   Eosinophils Absolute 08/04/2022 0.1  0.0 - 0.5 K/uL Final  Basophils Relative 08/04/2022 1  % Final   Basophils Absolute 08/04/2022 0.1  0.0 - 0.1 K/uL Final   Immature Granulocytes 08/04/2022 0  % Final   Abs Immature Granulocytes 08/04/2022 0.02  0.00 - 0.07 K/uL Final   Performed at [facility], [address],  [place], [place] [address]   Sodium 08/04/2022 139  135 - 145 mmol/L Final   Potassium 08/04/2022 3.3 (L)  3.5 - 5.1 mmol/L Final   Chloride 08/04/2022 105  98 - 111 mmol/L Final   CO2 08/04/2022 26  22 - 32 mmol/L Final   Glucose, Bld 08/04/2022 128 (H)  70 - 99 mg/dL Final   Glucose reference range applies only to samples taken after fasting for at least 8 hours.   BUN 08/04/2022 12  6 - 20 mg/dL Final   Creatinine, Ser 08/04/2022 0.65  0.44 - 1.00 mg/dL Final   Calcium 60/45/4098 9.5  8.9 - 10.3 mg/dL Final   Total Protein 11/91/4782 7.4  6.5 - 8.1 g/dL Final   Albumin 95/62/1308 4.1  3.5 - 5.0 g/dL Final   AST 65/78/4696 13 (L)  15 - 41 U/L Final   ALT 08/04/2022 10  0 - 44 U/L Final   Alkaline Phosphatase 08/04/2022 40  38 - 126 U/L Final   Total Bilirubin 08/04/2022 0.5  0.3 - 1.2 mg/dL Final   GFR, Estimated 08/04/2022 >60  >60 mL/min Final   Comment: (NOTE) Calculated using the CKD-EPI Creatinine Equation (2021)    Anion gap 08/04/2022 8  5 - 15 Final   Performed at [facility], [address], [place], [place] [address]   Hgb A1c MFr Bld 08/04/2022 5.1  4.8 - 5.6 % Final   Comment: (NOTE) Pre diabetes:          5.7%-6.4%  Diabetes:              >6.4%  Glycemic control for   <7.0% adults with diabetes    Mean Plasma Glucose 08/04/2022 99.67  mg/dL Final   Performed at [facility], [address], [place], [place] [address]   Alcohol, Ethyl (B) 08/04/2022 <10  <10 mg/dL Final   Comment: (NOTE) Lowest detectable limit for serum alcohol is 10 mg/dL.  For medical purposes only. Performed at [facility], [address] [place], [place] [address]    Cholesterol 08/04/2022 144  0 - 200 mg/dL Final   Triglycerides 72/53/6644 30  <150 mg/dL Final   HDL 03/47/4259 35 (L)  >40 mg/dL Final   Total CHOL/HDL Ratio 08/04/2022 4.1  RATIO Final   VLDL 08/04/2022 6  0 - 40 mg/dL Final   LDL Cholesterol 08/04/2022 103 (H)  0 - 99 mg/dL Final   Comment:         Total Cholesterol/HDL:CHD Risk Coronary Heart Disease Risk Table                     Men   Women  1/2 Average Risk   3.4   3.3  Average Risk       5.0   4.4  2 X Average Risk   9.6   7.1  3 X Average Risk  23.4   11.0        Use the calculated Patient Ratio above and the CHD Risk Table to determine the patient's CHD Risk.        ATP III CLASSIFICATION (LDL):  <100  mg/dL   Optimal  914-782  mg/dL   Near or Above                    Optimal  130-159  mg/dL   Borderline  956-213  mg/dL   High  >086     mg/dL   Very High Performed at [facility], [address], [place], [place] [address]    TSH 08/04/2022 1.110  0.350 - 4.500 uIU/mL Final   Comment: Performed by a 3rd Generation assay with a functional sensitivity of <=0.01 uIU/mL. Performed at [facility], [address] [place], [place] [address]    POC Amphetamine UR 08/04/2022 None Detected  NONE DETECTED (Cut Off Level 1000 ng/mL) Final   POC Secobarbital (BAR) 08/04/2022 None Detected  NONE DETECTED (Cut Off Level 300 ng/mL) Final   POC Buprenorphine (BUP) 08/04/2022 None Detected  NONE DETECTED (Cut Off Level 10 ng/mL) Final   POC Oxazepam (BZO) 08/04/2022 None Detected  NONE DETECTED (Cut Off Level 300 ng/mL) Final   POC Cocaine UR 08/04/2022 None Detected  NONE DETECTED (Cut Off Level 300 ng/mL) Final   POC Methamphetamine UR 08/04/2022 None Detected  NONE DETECTED (Cut Off Level 1000 ng/mL) Final   POC Morphine 08/04/2022 None Detected  NONE DETECTED (Cut Off Level 300 ng/mL) Final   POC Methadone UR 08/04/2022 None Detected  NONE DETECTED (Cut Off Level 300 ng/mL) Final   POC Oxycodone UR 08/04/2022 None Detected  NONE DETECTED (Cut Off Level 100 ng/mL) Final   POC Marijuana UR 08/04/2022 None Detected  NONE DETECTED (Cut Off Level 50 ng/mL) Final   SARSCOV2ONAVIRUS 2 AG 08/04/2022 NEGATIVE  NEGATIVE Final   Comment: (NOTE) SARS-CoV-2 antigen NOT DETECTED.   Negative results are presumptive.   Negative results do not preclude SARS-CoV-2 infection and should not be used as the sole basis for treatment or other patient management decisions, including infection  control decisions, particularly in the presence of clinical signs and  symptoms consistent with COVID-19, or in those who have been in contact with the virus.  Negative results must be combined with clinical observations, patient history, and epidemiological information. The expected result is Negative.  Fact Sheet for Patients: https://www.jennings-kim.com/  Fact Sheet for Healthcare Providers: https://alexander-rogers.biz/  This test is not yet approved or cleared by the [place] FDA and  has been authorized for detection and/or diagnosis of SARS-CoV-2 by FDA under an Emergency Use Authorization (EUA).  This EUA will remain in effect (meaning this test can be used) for the duration of  the COV                          ID-19 declaration under Section 564(b)(1) of the Act, 21 U.S.C. section 360bbb-3(b)(1), unless the authorization is terminated or revoked sooner.     Preg Test, Ur 08/04/2022 NEGATIVE  NEGATIVE Final   Comment:        THE SENSITIVITY OF THIS METHODOLOGY IS >24 mIU/mL     Allergies: Patient has no known allergies.  PTA Medications: (Not in a hospital admission)   Medical Decision Making  Patient is under involuntary commitment. Patient admitted to the [facility] behavioral health urgent care continuous assessment unit for active psychosis. Patient to be reevaluated on 08/18/22.  Medications:  Restart Risperidone 0.5 mg p.o. daily and 1.5 mg p.o. nightly for schizophrenia  Restart Remeron 15 p.o. nightly for mood/sleep  Restart Vistaril 25 mg  p.o. TID prn for sleep  Labs:  Lab Orders         Resp Panel by RT-PCR (Flu A&B, Covid) Anterior Nasal Swab         CBC with Differential/Platelet         Comprehensive metabolic panel         Ethanol          Pregnancy, urine         POCT Urine Drug Screen - (I-Screen)    EKG  Recommendations  Based on my evaluation the patient does not appear to have an emergency medical condition.  Layla Barter, NP 08/17/22  3:13 PM

## 2022-08-18 MED ORDER — RISPERIDONE 0.5 MG PO TABS: 1.5000 mg | ORAL_TABLET | Freq: Every day | ORAL | 0 refills | Status: DC

## 2022-08-18 MED ORDER — MIRTAZAPINE 15 MG PO TABS: 15.0000 mg | ORAL_TABLET | Freq: Every day | ORAL | 0 refills | Status: DC

## 2022-08-18 MED ORDER — RISPERIDONE 0.5 MG PO TABS
1.5000 mg | ORAL_TABLET | Freq: Every day | ORAL | Status: DC
  Filled 2022-08-18 (×2): qty 21

## 2022-08-18 NOTE — Progress Notes (Signed)
Patient is discharging at this time. Patient is A&Ox4. Vs stable. Patient denies SI,HI, and A/V/H with no plan/intent. Printed AVS reviewed with and given to patient along with medications and follow up appointments. Patient verbalized all understanding. All valuables/belongings returned to patient. Patient is being transported by her mother. Patient denies any pain/discomfort. No s/s of current distress.  

## 2022-08-18 NOTE — ED Notes (Signed)
Pt was given a sandwich, chips, and juice for lunch.  

## 2022-08-18 NOTE — ED Notes (Signed)
Pt is asleep with no signs of distress or broken sleep respirations are easy as evidenced by the rise and fall of pt chest.

## 2022-08-18 NOTE — Discharge Instructions (Addendum)
Discharge recommendations:  Patient is to take medications as prescribed. Please see information for follow-up appointment with psychiatry and therapy. Please follow up with your primary care provider for all medical related needs.   Therapy: We recommend that patient participate in individual therapy to address mental health concerns.  Medications: The patient is to contact a medical professional and/or outpatient provider to address any new side effects that develop. The patient should update outpatient providers of any new medications and/or medication changes.   Atypical antipsychotics: If you are prescribed an atypical antipsychotic, it is recommended that your height, weight, BMI, blood pressure, fasting lipid panel, and fasting blood sugar be monitored by your outpatient providers.  Safety:  The patient should abstain from use of illicit substances/drugs and abuse of any medications. If symptoms worsen or do not continue to improve or if the patient becomes actively suicidal or homicidal then it is recommended that the patient return to the closest hospital emergency department, the [facility], or call 911 for further evaluation and treatment. National Suicide Prevention Lifeline 1-800-SUICIDE or 1-800-273-8255.  About 988 988 offers 24/7 access to trained crisis counselors who can help people experiencing mental health-related distress. People can call or text 988 or chat 988lifeline.org for themselves or if they are worried about a loved one who may need crisis support.     

## 2022-08-18 NOTE — ED Provider Notes (Signed)
FBC/OBS ASAP Discharge Summary  Date and Time: 08/18/2022 12:51 PM  Name: Sarah Solis  MRN:  [id number]   Discharge Diagnoses:  Final diagnoses:  Schizophrenia spectrum disorder with psychotic disorder type not yet determined [facility])    Subjective: Patient seen and evaluated face-to-face by his provider, chart reviewed and case discussed with Dr. Viviano Simas. On evaluation, patient is alert and oriented x 4. Her speech is clear and coherent at a moderate tone. Her mood is euthymic and affect is congruent. She is calm and cooperative.  Today, patient denies suicidal ideations. She denies homicidal ideations. She denies auditory and visual hallucinations. There is no objective evidence that the patient is currently responding to internal or external stimuli. She reports still feeling a little paranoid like people are watching her through the cameras. [name] confirms that there are cameras in the home and states that is probably why she feels that way. Patient reports a fair appetite. She reports improved sleep last night and states that she is able to sleep with the sleeping medication. She reports feeling anxious and rates her anxiety 9 out of 10 with 10 being the worst. She states that she has always anxious being around a lot of people. She denies depressive symptoms. She is compliant with taking scheduled medications and denies medications side effects. She denies physical complaints at this time.   Stay Summary: Sarah Solis is a 21 year old female patient with a newly past psychiatric history of schizophreniform versus schizophrenia who presents to the [facility] behavioral health urgent care on 08/17/22 initially voluntary but was later involuntarily committed by her mother for an evaluation.   Per IVC:  "Respondent is diagnosed with schizophrenia. Is highly paranoid and having hallucinations. Not sleeping at all, constantly checking doors and windows throughout her home. Stated to  family members that she is hearing noises and needs help. This morning the respondent did not allow her sister to get on her school bus and then walked away from her residence. Respondents mother found her walking on [employer] claiming she was walking to the hospital. Respondent tried to get out of the moving car while going to the hospital with her mother. Has been committed before. Is a danger to herself."    Patient was admitted to the [facility] behavioral health continuous assessment unit for overnight observation. She was restarted on home medications: Risperdal 0.5 mg p.o. daily, Risperdal 1.5 mg po QHS,  Remeron 15 mg p.o. nightly and Vistaril at 25 mg p.o. 3 times daily as needed for anxiety. Patient has tolerated the prescribed medications without any noticeable side effects.  Labs obtained included CBC, CMP, UDS, BAL, urine preg and UDS. Labs unremarkable. UDS neg.   Patient re-evaluated on 08/18/12. Patient denies SI/HI/AVH. She does not appear actively psychotic. She has been observed overnight without any aggressive, disruptive, self harm or psychotic behaviors.   I spoke to the patient's mother [name] [phone], to provide an update on the patient's current status. She was informed that the patient slept last night after taking sleep medication. She was advised that the patient is eating and drinking fluids. I discussed with her that the patient denies SI/HI/AVH. I informed her that the patient does have some mild paranoia regarding cameras watching her but is not actively psychotic. She confirms that there are cameras in the home and states that is probably why she feels that way. She states that she picked the patient's medication up from the pharmacy today but it  was only for one bottle, the Risperdal 0.5 mg po daily. She states that the patient did not have prescriptions for the other medications, the Risperdal 1.5 mg at night or the Remeron for sleep. She states that the  patient was doing fine until Sunday. She states that the patient did not sleep on Sunday and that she believes it was because the patient didn't have the right medications. I discussed discharging the patient home today with a 7 day supply of sample medications for the Risperdal 1.5 mg and Remeron 15 mg  and have the patient follow [facility] for medication management. I also agreed to send an electronic prescription for Risperdal 1.5 mg po mg QHS and Remeron 15 mg po QHS. Ms. Lucretia RoersWood agrees to the stated plan. She was advised that if the patient's symptoms worsen to bring [facility] to the [facility], call 911 or take her to the nearest ED for an evaluations. Ms. Lucretia RoersWood was advised that as safety precautions, the patient should not have access to weapons, sharp objects or medications.   Total Time spent :30 minutes  Past Psychiatric History: . Past Medical History: No past medical history on file.  Past Surgical History:  Procedure Laterality Date   INCISION AND DRAINAGE PERIRECTAL ABSCESS Left 10/16/2020   Procedure: IRRIGATION AND DEBRIDEMENT PERIRECTAL ABSCESS;  Surgeon: Abigail MiyamotoBlackman, Douglas, MD;  [facility];  Service: General;  Laterality: Left;   Family History: No family history on file. Family Psychiatric History: Father has schizophrenia Social History:  Social History   Substance and Sexual Activity  Alcohol Use Not Currently     Social History   Substance and Sexual Activity  Drug Use Never    Social History   Socioeconomic History   Marital status: Single    Spouse name: Not on file   Number of children: Not on file   Years of education: Not on file   Highest education level: Not on file  Occupational History   Not on file  Tobacco Use   Smoking status: Never   Smokeless tobacco: Never  Vaping Use   Vaping Use: Some days  Substance and Sexual Activity   Alcohol use: Not Currently   Drug use: Never   Sexual activity: Not Currently  Other Topics Concern   Not on  file  Social History Narrative   Not on file   Social Determinants of Health   Financial Resource Strain: Not on file  Food Insecurity: Not on file  Transportation Needs: Not on file  Physical Activity: Not on file  Stress: Not on file  Social Connections: Not on file   SDOH:  SDOH Screenings   Alcohol Screen: Low Risk  (08/05/2022)  Tobacco Use: Low Risk  (08/05/2022)    Tobacco Cessation:  N/A, patient does not currently use tobacco products  Current Medications:  Current Facility-Administered Medications  Medication Dose Route Frequency Provider Last Rate Last Admin   acetaminophen (TYLENOL) tablet 650 mg  650 mg Oral Q6H PRN Shondra Capps L, NP       alum & mag hydroxide-simeth (MAALOX/MYLANTA) 200-200-20 MG/5ML suspension 30 mL  30 mL Oral Q4H PRN Palyn Scrima L, NP       hydrOXYzine (ATARAX) tablet 25 mg  25 mg Oral TID PRN Cranford Blessinger L, NP   25 mg at 08/18/22 1059   magnesium hydroxide (MILK OF MAGNESIA) suspension 30 mL  30 mL Oral Daily PRN Layla BarterWhite, Rodrecus Belsky L, NP  mirtazapine (REMERON) tablet 15 mg  15 mg Oral QHS Matilde Pottenger L, NP   15 mg at 08/17/22 2201   nicotine (NICODERM CQ - dosed in mg/24 hours) patch 14 mg  14 mg Transdermal Daily Deatrice Spanbauer L, NP   14 mg at 08/17/22 1658   risperiDONE (RISPERDAL) tablet 0.5 mg  0.5 mg Oral q AM Dantonio Justen L, NP   0.5 mg at 08/18/22 0857   risperiDONE (RISPERDAL) tablet 1.5 mg  1.5 mg Oral QHS Iden Stripling L, NP   1.5 mg at 08/17/22 2156   Current Outpatient Medications  Medication Sig Dispense Refill   hydrOXYzine (ATARAX) 10 MG tablet Take 1 tablet (10 mg total) by mouth 3 (three) times daily as needed for anxiety. 30 tablet 0   nicotine (NICODERM CQ - DOSED IN MG/24 HOURS) 14 mg/24hr patch Place 1 patch (14 mg total) onto the skin daily for 28 days. (Patient taking differently: Place 14 mg onto the skin daily as needed (For smoking cessation).) 28 patch 0   risperiDONE (RISPERDAL) 0.5 MG tablet Take 1  tablet (0.5 mg total) by mouth in the morning. (Patient taking differently: Take 0.5-1.5 mg by mouth 2 (two) times daily. Take one tablet in the morning and three tablets at bedtime.) 30 tablet 0   Vitamin D, Ergocalciferol, (DRISDOL) 1.25 MG (50000 UNIT) CAPS capsule Take 1 capsule (50,000 Units total) by mouth every 7 (seven) days for 5 doses. 5 capsule 0   mirtazapine (REMERON) 15 MG tablet Take 1 tablet (15 mg total) by mouth at bedtime. 30 tablet 0   risperiDONE (RISPERDAL) 0.5 MG tablet Take 3 tablets (1.5 mg total) by mouth at bedtime. 90 tablet 0    PTA Medications: (Not in a hospital admission)       No data to display          Flowsheet Row [facility] Admission (Discharged) [facility] from [facility]/2022 in [facility]  C-SSRS RISK CATEGORY No Risk No Risk No Risk       Musculoskeletal  Strength & Muscle Tone: within normal limits Gait & Station: normal Patient leans: N/A  Psychiatric Specialty Exam  Presentation  General Appearance: Appropriate for Environment  Eye Contact:Fair  Speech:Clear and Coherent  Speech Volume:Normal  Handedness:Right   Mood and Affect  Mood:Euthymic  Affect:Congruent   Thought Process  Thought Processes:Coherent  Descriptions of Associations:Circumstantial  Orientation:Full (Time, Place and Person)  Thought Content:Logical  Diagnosis of Schizophrenia or Schizoaffective disorder in past: Yes  Duration of Psychotic Symptoms: Less than six months   Hallucinations:Hallucinations: None Description of Auditory Hallucinations: Not applicable  Ideas of Reference:Paranoia  Suicidal Thoughts:Suicidal Thoughts: No SI Passive Intent and/or Plan: -- (Not applicable)  Homicidal Thoughts:Homicidal Thoughts: No   Sensorium  Memory:Immediate Fair; Recent Fair; Remote  Fair  Judgment:Intact  Insight:Fair   Executive Functions  Concentration:Fair  Attention Span:Fair  Recall:Fair  Fund of Knowledge:Fair  Language:Fair   Psychomotor Activity  Psychomotor Activity:Psychomotor Activity: Normal   Assets  Assets:Communication Skills; Desire for Improvement; Housing; Leisure Time; Physical Health; Social Support   Sleep  Sleep:Sleep: Fair Number of Hours of Sleep: 3   Nutritional Assessment (For OBS and FBC admissions only) Has the patient had a weight loss or gain of 10 pounds or more in the last 3 months?: No Has the patient had a decrease in food intake/or appetite?: No Does  the patient have dental problems?: No Does the patient have eating habits or behaviors that may be indicators of an eating disorder including binging or inducing vomiting?: No Has the patient recently lost weight without trying?: 0 Has the patient been eating poorly because of a decreased appetite?: 0 Malnutrition Screening Tool Score: 0    Physical Exam  Physical Exam HENT:     Head: Normocephalic.     Nose: Nose normal.  Eyes:     Conjunctiva/sclera: Conjunctivae normal.  Cardiovascular:     Rate and Rhythm: Normal rate.  Pulmonary:     Effort: Pulmonary effort is normal.  Musculoskeletal:        General: Normal range of motion.     Cervical back: Normal range of motion.  Neurological:     Mental Status: She is alert and oriented to person, place, and time.    Review of Systems  Constitutional: Negative.   HENT: Negative.    Eyes: Negative.   Respiratory: Negative.    Cardiovascular: Negative.   Gastrointestinal: Negative.   Genitourinary: Negative.   Endo/Heme/Allergies: Negative.    Blood pressure 117/68, pulse 91, temperature 98.2 F (36.8 C), temperature source Oral, resp. rate 18, SpO2 100 %. There is no height or weight on file to calculate BMI.  Demographic Factors:  Adolescent or young adult and Unemployed  Loss  Factors: Financial problems/change in socioeconomic status  Historical Factors: Family history of mental illness or substance abuse  Risk Reduction Factors:   Sense of responsibility to family, Religious beliefs about death, Living with another person, especially a relative, and Positive social support  Continued Clinical Symptoms:  Previous Psychiatric Diagnoses and Treatments  Cognitive Features That Contribute To Risk:  None    Suicide Risk:  Minimal: No identifiable suicidal ideation.  Patients presenting with no risk factors but with morbid ruminations; may be classified as minimal risk based on the severity of the depressive symptoms  Plan Of Care/Follow-up recommendations:  Activity:  as tolerated.   Sample medications provider x 7 days for Risperdal 1.5 mg po QHS and Remeron 15 mg po QHS. Prescriptions sent electronically to the patient's pharmacy, [facility] on [address]. Continue Risperdal 0.5 mg po daily in am and  vistaril 10 mg po TID as needed for anxiety.   Discharge recommendations:  Patient is to take medications as prescribed. Please see information for follow-up appointment with psychiatry and therapy. Please follow up with your primary care provider for all medical related needs.   Therapy: We recommend that patient participate in individual therapy to address mental health concerns.  Medications: The patient is to contact a medical professional and/or outpatient provider to address any new side effects that develop. The patient should update outpatient providers of any new medications and/or medication changes.   Atypical antipsychotics: If you are prescribed an atypical antipsychotic, it is recommended that your height, weight, BMI, blood pressure, fasting lipid panel, and fasting blood sugar be monitored by your outpatient providers.  Safety:  The patient should abstain from use of illicit substances/drugs and abuse of any medications. If symptoms worsen or do  not continue to improve or if the patient becomes actively suicidal or homicidal then it is recommended that the patient return to the closest hospital emergency department, the [facility], or call 911 for further evaluation and treatment. National Suicide Prevention Lifeline 1-800-SUICIDE or (303)120-9816.  About 988 988 offers 24/7 access to trained crisis counselors who can help people experiencing mental health-related distress.  People can call or text 988 or chat 988lifeline.org for themselves or if they are worried about a loved one who may need crisis support.   Disposition: Discharge home. [name] [name] will pick patient up today at 3:00 pm   Layla Barter, NP 08/18/2022, 12:51 PM

## 2022-08-18 NOTE — ED Notes (Signed)
Remains asleep condition unchanged respirations remain easy positive chest movement.

## 2022-08-18 NOTE — ED Notes (Signed)
Patient currently sleeping. No s/s of current distress.

## 2022-08-18 NOTE — ED Notes (Signed)
Patient denies SI,HI, and A/V/H with no plan/intent. Patient med compliant and cooperative on unit.

## 2022-08-19 ENCOUNTER — Encounter (HOSPITAL_COMMUNITY): Payer: Self-pay | Admitting: Psychiatry

## 2022-08-19 ENCOUNTER — Other Ambulatory Visit: Payer: Self-pay

## 2022-08-19 ENCOUNTER — Ambulatory Visit (INDEPENDENT_AMBULATORY_CARE_PROVIDER_SITE_OTHER): Payer: No Payment, Other | Admitting: Psychiatry

## 2022-08-19 DIAGNOSIS — F5105 Insomnia due to other mental disorder: Secondary | ICD-10-CM

## 2022-08-19 DIAGNOSIS — F99 Mental disorder, not otherwise specified: Secondary | ICD-10-CM

## 2022-08-19 DIAGNOSIS — F411 Generalized anxiety disorder: Secondary | ICD-10-CM

## 2022-08-19 DIAGNOSIS — F2081 Schizophreniform disorder: Secondary | ICD-10-CM

## 2022-08-19 MED ORDER — MIRTAZAPINE 15 MG PO TABS
15.0000 mg | ORAL_TABLET | Freq: Every evening | ORAL | 2 refills | Status: DC
  Filled 2022-08-19: qty 30, 30d supply, fill #0
  Filled 2022-09-17: qty 30, 30d supply, fill #1
  Filled 2022-10-24: qty 30, 30d supply, fill #2

## 2022-08-19 MED ORDER — HYDROXYZINE HCL 10 MG PO TABS
10.0000 mg | ORAL_TABLET | Freq: Three times a day (TID) | ORAL | 1 refills | Status: AC | PRN
  Filled 2022-08-19: qty 90, 30d supply, fill #0
  Filled 2022-09-17: qty 90, 30d supply, fill #1

## 2022-08-19 MED ORDER — RISPERIDONE 0.5 MG PO TABS
ORAL_TABLET | ORAL | 2 refills | Status: DC
  Filled 2022-08-19: qty 120, 30d supply, fill #0
  Filled 2022-09-17: qty 120, 30d supply, fill #1
  Filled 2022-10-24: qty 120, 30d supply, fill #2

## 2022-08-19 NOTE — Patient Instructions (Signed)
Thank you for attending your appointment today.  -- We did not make any medication changes; please continue all medications as prescribed. -- I have scheduled for you to meet with our case manager on 10/12 at 9:30. This will immediately follow our appointment at [facility].  Please do not make any changes to medications without first discussing with your provider. If you are experiencing a psychiatric emergency, please call 911 or present to your nearest emergency department. Additional crisis, medication management, and therapy resources are included below.  [facility]  [address], [place], [place] [address] [phone] or [phone] [facility] 24/7 FOR ANYONE [address] [place], [place]  [phone] Fax: [phone] [email or web address] *Interpreters available *Accepts all insurance and uninsured for Urgent Care needs *Accepts Medicaid and uninsured for outpatient treatment (below)      ONLY FOR [facility]  Below:    Outpatient New Patient Assessment/Therapy Walk-ins:        Monday -Thursday 8am until slots are full.        Every Friday 1pm-4pm  (first come, first served)                   New Patient Psychiatry/Medication Management        Monday-Friday 8am-11am (first come, first served)               For all walk-ins we ask that you arrive by 7:15am, because patients will be seen in the order of arrival.

## 2022-08-19 NOTE — Progress Notes (Signed)
Psychiatric Initial Adult Assessment  Patient Identification: Sarah Solis MRN:  [id number] Date of Evaluation:  08/19/2022 Referral Source: [facility]  Assessment:  Sarah Solis is a 21 y.o. y.o. female with recently diagnosied schizophreniform disorder, anxiety, and insomnia who presents to [facility] via video conferencing for outpatient follow-up after recent psychiatric hospitalization for symptoms of psychosis.  Patient reports overall benefit from current psychiatric regimen although with brief exacerbation of symptoms of psychosis associated with difficulty obtaining psychiatric medications on discharge.  Patient presented to [facility] and was given 1 week sample of medications.  This provider worked with patient and patient's mother to identify cost affordable pharmacy and verified that they would pick up medications today.  Patient currently endorses stability of anxiety and mood although with lingering symptoms of psychosis including delusions of reference and mild paranoia.  Mom reports patient has been more engaged with family.  No issues with self care or safety concerns at this time.  Patient identifies little insight into her diagnosis and will benefit from psychoeducation at future visits. Discussed with mom patient's prognosis and strategy for assessing readiness for return to work (reportedly patient has voiced desire to do so and I would encourage gradual re-introduction of structure and opportunities for independence as tolerated) that will continue to be discussed at future visits.   Given history of prodromal symptoms including social withdrawal, decreased appetite with weight loss, and impaired hygiene over the period of a few months followed by development of auditory and visual hallucinations, paranoia, and delusions of reference well as family history of schizophrenia, patient's presentation is concerning for diagnosis of schizophreniform disorder (does not yet meet  criteria for schizophrenia given < less than 6 month duration at this time).  Will continue medications as below with plan to return to care in 4 weeks.  Plan:  # Schizophreniform disorder Past medication trials: none Status of problem: new problem to this provider Interventions: -- Continue Risperdal 0.5 mg qAM + 1.5 mg qHS -- First episode psychosis workup performed while inpatient September 2023 negative -- ANA-negative, Ceruloplasmin-22.2, RPR non reactive, heavy metal, Vitamin B1 WNL, Head CT noncontrast with no acute findings  # Anxiety  Insomnia Past medication trials: none Status of problem: new problem to this provider Interventions: -- Continue Remeron 15 mg nightly -- Continue Atarax 10 mg TID PRN anxiety  # Vitamin D deficiency Status of problem: acute Interventions: -- Vitamin D 12.89 on 08/09/22; plan to recheck in 3 months -- Continue Vitamin D2 50,000 units weekly for 5 doses followed by 800 IU daily thereafter  # Metabolic Monitoring Interventions: -- Last lipid panel (roughly wnl) and A1c (wnl) on 08/04/22  Patient was given contact information for behavioral health clinic and was instructed to call 911 for emergencies.   Subjective:  Chief Complaint:  Chief Complaint  Patient presents with   Medication Management    History of Present Illness:   Patient reports she is unsure of her diagnosis. Describes that prior to hospitalization she was experiencing symptoms of depression, not eating, trouble sleeping, trouble thinking clearly, withdrawing from others, and strange thoughts including paranoia, delusions of reference (hearing a song and feeling it was meant for her; seeing an item on the floor and thinking it was meant for her). States hospitalization was overall a positive experience as medication was helpful - thoughts are more slowed down; she feels less wary and anxious. Continues to see messages intermittently and patterns but denies distress related to  this. Denies feeling like  she has a special mission or special powers. Denies current or past experience of AVH (although this is not substantiated on chart review). Endorses mild paranoia a/e/b fear that strangers recognize her and this causes her to keep to herself. Denies believing that others want to necessarily hurt her. Feels safe around family although gets in a "zone" where she doesn't want to be bothered. Denies concern about safety of food. Appetite has been "good" and increased with medication. States that in the 2-3 months preceding hospitalization, she had experienced a weight loss from 160 lbs to 130 lbs.   Mood is "calm" and denies significant anxiety. Denies SI or HI. However states when off medications, may be hypervigilant and feel hopeless. In the past when overwhelmed, had thoughts of "what's the point?" But denies currently. States Vitamin D has provided more energy; showering about every day and brushing her teeth regularly. Denies any issues with bathing or hygiene. Sleep has been better with Remeron. Feels that hydroxyzine helps her feel calm and denies oversedation.   States she had trouble getting medication after the hospital and went to [facility]. Was given samples for a 1 week supply. This provider worked with mom and patient to identify cost-effective pharmacy and verified current prescriptions.   Denies side effects to current medications including dizziness, oversedation, abd pain or constipation (endorses BM every day).   Patient lives with mother, mother's husband, 2 sisters, and brother. Spends her days mostly at home. Not currently unemployed. 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 Graduated high school and states she has an overall good experience and states she enjoyed being around others although still isolated herself from others. Denies having friends currently. Made mostly Bs/Cs. Enjoys listening to music - any genre.    Spoke with mom: Worked with mom to identify pharmacy that offers affordable price on medications. Mom requests assistance with application for Medicaid and discussed CM referral. She states that patient has been doing "really well" on current medication regimen (outside of lapse in medications). . Mom says she is less withdrawn. Denies any safety concerns. Initially patient had been managing medications but mom has been providing more supervision. Inquires about patient's ability to return to work and prognosis.   Past Psychiatric History:  Diagnoses: schizophreniform disorder, anxiety Suicide attempts: denies Hospitalizations: x1 [facility] 08/06/22-08/13/22 for psychosis Therapy: denies  Previous Psychotropic Medications: No  prior to recent hospitalization  Substance Abuse History in the last 12 months: none consistently EtOH - rarely Cannabis - denies recently; last smoked months ago and reports infrequent use Denies use of cocaine or other stimulants, benzodiazepines, mushrooms, LSD. Tobacco: vapes [employer] infrequently  Consequences of Substance Abuse: NA  Past Medical History: History reviewed. No pertinent past medical history.  Past Surgical History:  Procedure Laterality Date   INCISION AND DRAINAGE PERIRECTAL ABSCESS Left 10/16/2020   Procedure: IRRIGATION AND DEBRIDEMENT PERIRECTAL ABSCESS;  Surgeon: Abigail Miyamoto, MD;  Location: [facility] OR;  Service: General;  Laterality: Left;    Family Psychiatric History:  Father: schizophrenia; never received medication treatment  Family History:  Family History  Problem Relation Age of Onset   Schizophrenia Father     Social History:   Social History   Socioeconomic History   Marital status: Single    Spouse name: Not on file   Number of children: Not on file   Years of education: Not on file   Highest education level: Not on file  Occupational  History   Not on file  Tobacco Use   Smoking status: Never   Smokeless tobacco:  Never  Vaping Use   Vaping Use: Some days  Substance and Sexual Activity   Alcohol use: Not Currently   Drug use: Not Currently   Sexual activity: Not Currently  Other Topics Concern   Not on file  Social History Narrative   Not currently working but looking to return to work.    Lives with mother, mother's husband, 2 sisters, and brother.   Social Determinants of Health   Financial Resource Strain: Not on file  Food Insecurity: Not on file  Transportation Needs: Not on file  Physical Activity: Not on file  Stress: Not on file  Social Connections: Not on file    Additional Social History: updated  Allergies:  No Known Allergies  Current Medications: Current Outpatient Medications  Medication Sig Dispense Refill   hydrOXYzine (ATARAX) 10 MG tablet Take 1 tablet (10 mg total) by mouth 3 (three) times daily as needed for anxiety. 90 tablet 1   mirtazapine (REMERON) 15 MG tablet Take 1 tablet (15 mg total) by mouth at bedtime. 30 tablet 2   nicotine (NICODERM CQ - DOSED IN MG/24 HOURS) 14 mg/24hr patch Place 1 patch (14 mg total) onto the skin daily for 28 days. (Patient taking differently: Place 14 mg onto the skin daily as needed (For smoking cessation).) 28 patch 0   risperiDONE (RISPERDAL) 0.5 MG tablet Take 3 tablets (1.5 mg total) by mouth at bedtime. 90 tablet 0   risperiDONE (RISPERDAL) 0.5 MG tablet Take 1 tablet (0.5 mg total) by mouth in the morning AND 3 tablets (1.5 mg total) at bedtime. 120 tablet 2   Vitamin D, Ergocalciferol, (DRISDOL) 1.25 MG (50000 UNIT) CAPS capsule Take 1 capsule (50,000 Units total) by mouth every 7 (seven) days for 5 doses. 5 capsule 0   No current facility-administered medications for this visit.    ROS: Denies pain or physical complaints. Denies dizziness/lightheadedness. Denies abd pain, constipation.  Objective:  Psychiatric Specialty Exam: There were no vitals taken for this visit.There is no height or weight on file to calculate  BMI.  General Appearance: Casual and Fairly Groomed  Eye Contact:  Fair  Speech:   Moderately slowed rate  Volume:  Normal  Mood:   "Calm"  Affect:  Constricted and Euthymic  Thought Process:  Answers direct questions appropriately however vague in responses  Orientation:  Full (Time, Place, and Person)  Thought Content: Endorses ideas of reference - messages heard through music or items in her environment. Endorses paranoia - concern that strangers "recognize" her. Denies AVH.   Suicidal Thoughts:  No  Homicidal Thoughts:  No  Memory:   Grossly intact  Judgment:  Other:  Improving  Insight:  Lacking  Psychomotor Activity:  Normal  Concentration:  Concentration: Distractible  Recall:  NA  Fund of Knowledge:Fair  Language: Fair  Akathisia:  NA  Handed:  n/a  AIMS (if indicated):  not done  Assets:  Desire for Improvement Housing Leisure Time Physical Health Social Support Transportation  ADL's:  Intact  Cognition: WNL  Sleep:   Improving   PE: General: sits comfortably in view of camera; no acute distress  Pulm: no increased work of breathing on room air  MSK: all extremity movements appear intact  Neuro: no focal neurological deficits observed  Gait & Station: unable to assess by video    Metabolic Disorder Labs: Lab Results  Component Value  Date   HGBA1C 5.1 08/04/2022   MPG 99.67 08/04/2022   No results found for: "PROLACTIN" Lab Results  Component Value Date   CHOL 144 08/04/2022   TRIG 30 08/04/2022   HDL 35 (L) 08/04/2022   CHOLHDL 4.1 08/04/2022   VLDL 6 08/04/2022   LDLCALC 103 (H) 08/04/2022   Lab Results  Component Value Date   TSH 1.110 08/04/2022    Therapeutic Level Labs: No results found for: "LITHIUM" No results found for: "CBMZ" No results found for: "VALPROATE"  Screenings:  AIMS    Flowsheet Row [facility]  AIMS Total Score 0      AUDIT    Flowsheet  Row [facility]  Alcohol Use Disorder Identification Test Final Score (AUDIT) 1      Flowsheet Row [facility] Ad[facility] (Discharged) [facility]/2022 [facility]  C-SSRS RISK CATEGORY No Risk No Risk No Risk       Collaboration of Care: Collaboration of Care: Medication Management AEB active medication management, Psychiatrist AEB established with this provider, and Referral or follow-up with counselor/therapist AEB referral to case management for assistance regarding Medicaid application  Patient/Guardian was advised Release of Information must be obtained prior to any record release in order to collaborate their care with an outside provider. Patient/Guardian was advised if they have not already done so to contact the registration department to sign all necessary forms in order for [place] to release information regarding their care.   Consent: Patient/Guardian gives verbal consent for treatment and assignment of benefits for services provided during this visit. Patient/Guardian expressed understanding and agreed to proceed.   Televisit via video: I connected with Sarah Solis on 08/19/22 at  1:00 PM EDT by a video enabled telemedicine application and verified that I am speaking with the correct person using two identifiers.  Location: Patient: home address on file in [place] Provider: remote in [place]   I discussed the limitations of evaluation and management by telemedicine and the availability of in person appointments. The patient expressed understanding and agreed to proceed.  I discussed the assessment and treatment plan with the patient. The patient was provided an opportunity to ask questions and all were answered. The patient agreed with the plan and demonstrated  an understanding of the instructions.   The patient was advised to call back or seek an in-person evaluation if the symptoms worsen or if the condition fails to improve as anticipated.  I provided 100 minutes of non-face-to-face time during this encounter.  Sarah Solis A  9/13/20233:10 PM

## 2022-08-20 ENCOUNTER — Other Ambulatory Visit: Payer: Self-pay

## 2022-08-21 ENCOUNTER — Other Ambulatory Visit: Payer: Self-pay

## 2022-08-26 ENCOUNTER — Ambulatory Visit (HOSPITAL_COMMUNITY): Payer: No Payment, Other | Admitting: Student

## 2022-08-31 ENCOUNTER — Encounter (HOSPITAL_COMMUNITY): Payer: Self-pay

## 2022-08-31 ENCOUNTER — Ambulatory Visit (INDEPENDENT_AMBULATORY_CARE_PROVIDER_SITE_OTHER): Payer: No Payment, Other | Admitting: Mental Health

## 2022-08-31 DIAGNOSIS — F411 Generalized anxiety disorder: Secondary | ICD-10-CM

## 2022-08-31 DIAGNOSIS — F2081 Schizophreniform disorder: Secondary | ICD-10-CM

## 2022-08-31 NOTE — Plan of Care (Signed)
  Problem: Thought Disorders CCP Problem  1 Schizophreniform Goal: LTG: Increase coping skills to promote long-term recovery and improve ability to perform daily activities Outcome: Initial Goal: STG: Celise will increase stabilization in sxs AEB taking medications daily with ability to engage in reality testing techniques as needed within the next 6 months  Outcome: Initial   Problem: Anxiety Disorder CCP Problem  1  Goal: LTG: [place]an will score less than 5 on the Generalized Anxiety Disorder 7 Scale (GAD-7) Outcome: Initial Goal: STG: Prim will decreased feelings of anxiety AEB development of x 3 effective coping skills with ability to reframe anxious thoughts 7/7 days within the next 6 months  Outcome: Initial
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
## 2022-09-15 ENCOUNTER — Ambulatory Visit (INDEPENDENT_AMBULATORY_CARE_PROVIDER_SITE_OTHER): Payer: No Payment, Other | Admitting: Mental Health

## 2022-09-15 DIAGNOSIS — F2081 Schizophreniform disorder: Secondary | ICD-10-CM | POA: Diagnosis not present

## 2022-09-15 NOTE — Progress Notes (Unsigned)
   THERAPIST PROGRESS NOTE  Session Time: 4:35pm (42 minutes)   Participation Level: Active  Behavioral Response: CasualAlertEuthymic  Type of Therapy: Individual Therapy  Treatment Goals addressed: STG: Sarah Solis will increase stabilization in sxs AEB taking medications daily with ability to engage in reality testing techniques as needed within the next 6 months   STG: Sarah Solis will decreased feelings of anxiety AEB development of x 3 effective coping skills with ability to reframe anxious thoughts 7/7 days within the next 6 months   ProgressTowards Goals: Initial  Interventions: CBT and Supportive  Summary: Sarah Solis is a 21 y.o. female who presents with  dx of schizophreniform disorder. Sarah Solis presents to session alert and oriented; mood and affect euthymic, smiles and laughs. Speech clear and coherent at normal rate and tone. Engaged and receptive to interventions. Shares to be doing well and for moods to have been stable. Denies presence of psychotic sxs and shares was hearing voices in which was causing her great anxiety. Reports for this to have been distressing and had difficulty coping. Engages with therapist and shares concerning prevalence of intensity of psychotic sxs. Reports to be medication compliant but expresses concerns for ongoing ability to take medications with concern for finances and not having health insurance. States to also be looking for employment and feels this will also be supportive in maintaining stability. Shares has been presenting outside which has also been helpful towards her. Shares difficulty being around others and shares she can largely isolate. Explores desire to become aesthetician and enjoys learning and doing skin care. Shares would like to explore engagement in hobbies with desire to increase activity during day with currently not working. Reports to engage with family well and hopeful of ability to maintain stability. No safety concerns reported.     Suicidal/Homicidal: Nowithout intent/plan  Therapist Response: Therapist engaged Sarah Solis in therapy session. Completed check in and assessed for current level of functioning, sxs management and presence of stressors. Provided safe space for Sarah Solis to share current functioning and explored ability to cope with voices. Engaged in education on presence of psychotic sxs and explored coping skills which can be beneficial to support in distracting when voices are present. Encouraged Sarah Solis to maintain medication compliance and provided education on importance of medication compliance. Supported in processing thoughts and feelings regarding presence of psychotic sxs and explored factors that support in management of mental health. Encouraged Sarah Solis to follow up with medication management appointments and attend appointment with eligibility case worker with DSS. Encouraged exploration and development of things in which she enjoys and working to establish routine. Reviewed session and provided follow up session. Assessed for safety.   Plan: Return again in  x 2 weeks.  Diagnosis: Schizophreniform disorder ([place])  Collaboration of Care: Other Reviewed and supported with appointment with DSS eligibility case worker  Patient/Guardian was advised Release of Information must be obtained prior to any record release in order to collaborate their care with an outside provider. Patient/Guardian was advised if they have not already done so to contact the registration department to sign all necessary forms in order for [place] to release information regarding their care.   Consent: Patient/Guardian gives verbal consent for treatment and assignment of benefits for services provided during this visit. Patient/Guardian expressed understanding and agreed to proceed.   Rockey Situ Sarah Solis, [facility] 09/15/2022

## 2022-09-16 NOTE — Patient Instructions (Addendum)
Thank you for attending your appointment today.  -- Continue Risperdal 0.5 mg in the morning and 1.5 mg at night -- Continue Remeron 15 mg nightly -- START trazodone 25-50 mg nightly as needed for sleep -- Continue Atarax 10 mg up to three times daily for anxiety -- START Vitamin D2 800 IU daily in place of the weekly Vitamin D. This can be obtained over the counter.  -- I will send your medications to [facility] -- Continue other medications as prescribed.  Please do not make any changes to medications without first discussing with your provider. If you are experiencing a psychiatric emergency, please call 911 or present to your nearest emergency department. Additional crisis, medication management, and therapy resources are included below.  [facility]  [address] [place], [place] [address] [phone] WALK-IN URGENT CARE 24/7 FOR ANYONE [address], [place], [place] Fax: [phone] [email or web address] *Interpreters available *Accepts all insurance and uninsured for Urgent Care needs *Accepts Medicaid and uninsured for outpatient treatment (below)      ONLY FOR [facility]  Below:    Outpatient New Patient Assessment/Therapy Walk-ins:        Monday -Thursday 8am until slots are full.        Every Friday 1pm-4pm  (first come, first served)                   New Patient Psychiatry/Medication Management        Monday-Friday 8am-11am (first come, first served)               For all walk-ins we ask that you arrive by 7:15am, because patients will be seen in the order of arrival.

## 2022-09-16 NOTE — Progress Notes (Signed)
[name] Outpatient Progress Note  09/17/2022 10:03 AM Sarah Solis  MRN:  [id number]  Assessment:  Sarah Solis presents for follow-up evaluation in-person. Today, 09/17/22, patient reports ongoing improvements and denies further experience of AVH or IOR since taking medication consistently. Endorses stable mood and denies anxiety.  Demonstrates functional improvements as evidenced by improved self hygiene and actively looking for jobs. Some lingering negative symptoms in the form of somewhat concrete and vague thought process however interacts and communicates appropriately. She and her mom are scheduled to meet with CM today for assistance with application for Medicaid. Will add low-dose trazodone to further support sleep and instructed patient to transition to daily Vitamin D. No other medication changes at this time.   Plan to RTC in 2 months.   Identifying Information: Sarah Solis is a 21 y.o. female with a history of recently diagnosed schizophreniform disorder, anxiety, and insomnia who is an established patient with [facility] for management of symptoms of psychosis.   Plan:  Schizophreniform disorder Past medication trials: none Status of problem: new problem to this provider Interventions: -- Continue Risperdal 0.5 mg qAM + 1.5 mg qHS -- First episode psychosis workup performed while inpatient September 2023 negative -- ANA-negative, Ceruloplasmin-22.2, RPR non reactive, heavy metal, Vitamin B1 WNL, Head CT noncontrast with no acute findings -- Continue therapy with Rockey Situ, [facility]   # Anxiety  Insomnia Past medication trials: none Status of problem: new problem to this provider Interventions: -- Continue Remeron 15 mg nightly -- START trazodone 25-50 mg nightly as needed for sleep  -- Risks, benefits, and side effects including but not limited to dizziness, drowsiness, nausea were reviewed with informed consent provided -- Continue Atarax 10 mg  TID PRN anxiety   # Vitamin D deficiency Status of problem: acute Interventions: -- Vitamin D 12.89 on 08/09/22; plan to recheck in 3 months -- Patient completed Vitamin D2 50,000 units weekly for 5 doses; start 800 IU daily at this time   # Metabolic Monitoring Interventions: -- Last lipid panel (roughly wnl) and A1c (wnl) on 08/04/22  Patient was given contact information for behavioral health clinic and was instructed to call 911 for emergencies.   Subjective:  Chief Complaint:  Chief Complaint  Patient presents with   Medication Management   Interval History:   Reports she has been doing "good" and anxiety is better controlled. Mood is "good" and denies periods of feeling depressed. Continues to take medications as prescribed - often takes 2 hydroxyzine at night for sleep but requests to return to trazodone as she found this more helpful. Uses hydroxyzine sparingly for anxiety but does find it helpful when taken.   Feeling a little more comfortable in the house - sitting outside more. Got a job at a [employer] but stopped because objects were too heavy to lift - did this for about 2 weeks. Has been looking into other jobs.   Denies AVH since being on medication. No longer seeing patterns/messages places - gives an example that previously something might fall and she would think it was meant for her. Denies SI, HI. Denies trouble with self-hygiene; often showers once to twice daily and brushing teeth regularly. Appetite has been "good" - eating at least twice a day.    Visit Diagnosis:    ICD-10-CM   1. 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 Insomnia due to other mental disorder  F51.05    F99  Past Psychiatric History:  Diagnoses: schizophreniform disorder, anxiety Suicide attempts: denies Hospitalizations: x1 [facility] 08/06/22-08/13/22 for psychosis Therapy: denies Substance use:  -- EtOH - rarely -- Cannabis - denies recently; last smoked months ago and  reports infrequent use -- Denies use of cocaine or other stimulants, benzodiazepines, mushrooms, LSD. -- Tobacco: vapes [employer] infrequently  Past Medical History: History reviewed. No pertinent past medical history.  Past Surgical History:  Procedure Laterality Date   INCISION AND DRAINAGE PERIRECTAL ABSCESS Left 10/16/2020   Procedure: IRRIGATION AND DEBRIDEMENT PERIRECTAL ABSCESS;  Surgeon: Abigail Miyamoto, MD;  Location: [facility] OR;  Service: General;  Laterality: Left;    Family Psychiatric History:  Father: schizophrenia; never received medication treatment  Family History:  Family History  Problem Relation Age of Onset   Schizophrenia Father     Social History:  Social History   Socioeconomic History   Marital status: Single    Spouse name: Not on file   Number of children: Not on file   Years of education: Not on file   Highest education level: Not on file  Occupational History   Not on file  Tobacco Use   Smoking status: Never   Smokeless tobacco: Never  Vaping Use   Vaping Use: Some days  Substance and Sexual Activity   Alcohol use: Not Currently   Drug use: Not Currently   Sexual activity: Not Currently  Other Topics Concern   Not on file  Social History Narrative   Not currently working but looking to return to work.    Lives with mother, mother's husband, 2 sisters, and brother.   Social Determinants of Health   Financial Resource Strain: Medium Risk (08/31/2022)   Overall Financial Resource Strain (CARDIA)    Difficulty of Paying Living Expenses: Somewhat hard  Food Insecurity: No Food Insecurity (08/31/2022)   Hunger Vital Sign    Worried About Running Out of Food in the Last Year: Never true    Ran Out of Food in the Last Year: Never true  Transportation Needs: Unmet Transportation Needs (08/31/2022)   PRAPARE - Transportation    Lack of Transportation (Medical): Yes    Lack of Transportation (Non-Medical): Yes  Physical Activity: Inactive  (08/31/2022)   Exercise Vital Sign    Days of Exercise per Week: 0 days    Minutes of Exercise per Session: 0 min  Stress: Stress Concern Present (08/31/2022)   [employer] of Occupational Health - Occupational Stress Questionnaire    Feeling of Stress : To some extent  Social Connections: Socially Isolated (08/31/2022)   Social Connection and Isolation Panel [NHANES]    Frequency of Communication with Friends and Family: Never    Frequency of Social Gatherings with Friends and Family: Never    Attends Religious Services: 1 to 4 times per year    Active Member of [employer] or Organizations: No    Attends Engineer, structural: Never    Marital Status: Never married    Allergies: No Known Allergies  Current Medications: Current Outpatient Medications  Medication Sig Dispense Refill   Ergocalciferol (VITAMIN D2) 10 MCG (400 UNIT) TABS Take 800 Units by mouth in the morning. (2 tablets) 60 tablet 2   hydrOXYzine (ATARAX) 10 MG tablet Take 1 tablet (10 mg total) by mouth 3 (three) times daily as needed for anxiety. 90 tablet 1   mirtazapine (REMERON) 15 MG tablet Take 1 tablet (15 mg total) by mouth at bedtime. 30 tablet 2   risperiDONE (RISPERDAL) 0.5  MG tablet Take 1 tablet (0.5 mg total) by mouth in the morning AND 3 tablets (1.5 mg total) at bedtime. 120 tablet 2   traZODone (DESYREL) 50 MG tablet Take 1/2-1 tablet (25-50 mg) nightly as needed for sleep. 30 tablet 2   No current facility-administered medications for this visit.    ROS: Denies any side effects to medications including dizziness, abd pain, constipation, breast swelling or nipple discharge, or muscle stiffness.   Objective:  Psychiatric Specialty Exam: There were no vitals taken for this visit.There is no height or weight on file to calculate BMI.  General Appearance: Casual and Well Groomed  Eye Contact:  Fair  Speech:   Brief responses; somewhat decreased variation in tone  Volume:  Normal  Mood:    "good"  Affect:  Appropriate and Calm, constricted  Thought Content:  Endorses resolution of prior AVH; IOR    Suicidal Thoughts:  No  Homicidal Thoughts:  No  Thought Process:  Overall linear/logical; can provide concrete and vague responses at times  Orientation:  Full (Time, Place, and Person)    Memory:   Grossly intact  Judgment:  Good  Insight:  Fair  Concentration:  Concentration: Fair  Recall:  NA  Fund of Knowledge: Good  Language: Good  Psychomotor Activity:  Normal  Akathisia:  No  AIMS (if indicated): not done  Assets:  Desire for Improvement Housing Leisure Time Physical Health Social Support Talents/Skills Transportation  ADL's:  Intact  Cognition: WNL  Sleep:  Good   PE: General: well-appearing; no acute distress  Pulm: no increased work of breathing on room air  Strength & Muscle Tone: within normal limits Neuro: no focal neurological deficits observed  Gait & Station: normal  Metabolic Disorder Labs: Lab Results  Component Value Date   HGBA1C 5.1 08/04/2022   MPG 99.67 08/04/2022   No results found for: "PROLACTIN" Lab Results  Component Value Date   CHOL 144 08/04/2022   TRIG 30 08/04/2022   HDL 35 (L) 08/04/2022   CHOLHDL 4.1 08/04/2022   VLDL 6 08/04/2022   LDLCALC 103 (H) 08/04/2022   Lab Results  Component Value Date   TSH 1.110 08/04/2022    Therapeutic Level Labs: No results found for: "LITHIUM" No results found for: "VALPROATE" No results found for: "CBMZ"  Screenings: AIMS    Flowsheet Row [facility]  AIMS Total Score 0      AUDIT    Flowsheet Row Ad[facility] ([facility]  Alcohol Use Disorder Identification Test Final Score (AUDIT) 1      GAD-7    Flowsheet Row Counselor from 08/31/2022 in [facility]  Total GAD-7 Score 14      PHQ2-9     Flowsheet Row Counselor from 08/31/2022 in [facility]  PHQ-2 Total Score 4  PHQ-9 Total Score 13      Flowsheet Row Counselor from 08/31/2022 in [facility] (Discharged[facility]  C-SSRS RISK CATEGORY No Risk No Risk No Risk       Collaboration of Care: Collaboration of Care: Medication Management AEB active medication changes and Psychiatrist AEB established with this provider  Patient/Guardian was advised Release of Information must be obtained prior to any record release in order to collaborate their care  with an outside provider. Patient/Guardian was advised if they have not already done so to contact the registration department to sign all necessary forms in order for [place] to release information regarding their care.   Consent: Patient/Guardian gives verbal consent for treatment and assignment of benefits for services provided during this visit. Patient/Guardian expressed understanding and agreed to proceed.   A total of 40 minutes was spent involved in face to face clinical care, chart review, documentation, and medication management.   Derrika Ruffalo A  09/17/2022, 10:03 AM

## 2022-09-16 NOTE — Plan of Care (Signed)
  Problem: Thought Disorders CCP Problem  1 Schizophreniform Goal: LTG: Increase coping skills to promote long-term recovery and improve ability to perform daily activities Outcome: Initial Goal: STG: Esmee will increase stabilization in sxs AEB taking medications daily with ability to engage in reality testing techniques as needed within the next 6 months  Outcome: Initial   Problem: Anxiety Disorder CCP Problem  1  Goal: LTG: Shirleen will score less than 5 on the Generalized Anxiety Disorder 7 Scale (GAD-7) Outcome: Initial Goal: STG: Laiklyn will decreased feelings of anxiety AEB development of x 3 effective coping skills with ability to reframe anxious thoughts 7/7 days within the next 6 months  Outcome: Initial   

## 2022-09-17 ENCOUNTER — Ambulatory Visit (INDEPENDENT_AMBULATORY_CARE_PROVIDER_SITE_OTHER): Payer: No Payment, Other | Admitting: Psychiatry

## 2022-09-17 ENCOUNTER — Encounter (HOSPITAL_COMMUNITY): Payer: Self-pay | Admitting: Psychiatry

## 2022-09-17 ENCOUNTER — Other Ambulatory Visit: Payer: Self-pay

## 2022-09-17 DIAGNOSIS — F5105 Insomnia due to other mental disorder: Secondary | ICD-10-CM | POA: Diagnosis not present

## 2022-09-17 DIAGNOSIS — F2081 Schizophreniform disorder: Secondary | ICD-10-CM

## 2022-09-17 DIAGNOSIS — F99 Mental disorder, not otherwise specified: Secondary | ICD-10-CM

## 2022-09-17 MED ORDER — VITAMIN D2 10 MCG (400 UNIT) PO TABS
800.0000 [IU] | ORAL_TABLET | Freq: Every morning | ORAL | 2 refills | Status: DC
  Filled 2022-09-17 – 2022-10-27 (×7): qty 60, fill #0

## 2022-09-17 MED ORDER — TRAZODONE HCL 50 MG PO TABS
25.0000 mg | ORAL_TABLET | Freq: Every evening | ORAL | 2 refills | Status: DC | PRN
  Filled 2022-09-17: qty 30, 30d supply, fill #0
  Filled 2022-10-24: qty 30, 30d supply, fill #1

## 2022-09-18 ENCOUNTER — Other Ambulatory Visit: Payer: Self-pay

## 2022-09-21 ENCOUNTER — Other Ambulatory Visit: Payer: Self-pay

## 2022-09-23 ENCOUNTER — Other Ambulatory Visit: Payer: Self-pay

## 2022-09-30 ENCOUNTER — Other Ambulatory Visit: Payer: Self-pay

## 2022-09-30 ENCOUNTER — Ambulatory Visit (INDEPENDENT_AMBULATORY_CARE_PROVIDER_SITE_OTHER): Payer: No Payment, Other | Admitting: Mental Health

## 2022-09-30 DIAGNOSIS — F2081 Schizophreniform disorder: Secondary | ICD-10-CM

## 2022-09-30 NOTE — Progress Notes (Signed)
THERAPIST PROGRESS NOTE Virtual Visit via Video Note  I connected with Sarah Solis on 09/30/22 at  8:00 AM EDT by a video enabled telemedicine application and verified that I am speaking with the correct person using two identifiers.  Location: Patient: home address on file Provider: office   I discussed the limitations of evaluation and management by telemedicine and the availability of in person appointments. The patient expressed understanding and agreed to proceed.   I discussed the assessment and treatment plan with the patient. The patient was provided an opportunity to ask questions and all were answered. The patient agreed with the plan and demonstrated an understanding of the instructions.   The patient was advised to call back or seek an in-person evaluation if the symptoms worsen or if the condition fails to improve as anticipated.  I provided 40 minutes of non-face-to-face time during this encounter.   [name], [facility]   Session Time: 8:05am (  Participation Level: Active  Behavioral Response: CasualAlertEuthymic  Type of Therapy: Individual Therapy  Treatment Goals addressed: STG: Versie will increase stabilization in sxs AEB taking medications daily with ability to engage in reality testing techniques as needed within the next 6 months    STG: [name] will decreased feelings of anxiety AEB development of x 3 effective coping skills with ability to reframe anxious thoughts 7/7 days within the next 6 months   ProgressTowards Goals: Progressing  Interventions: Supportive  Summary:  Sarah Solis is a 21 y.o. female who presents with  dx of schizophreniform disorder. Kelyse presents to session alert and oriented; mood and affect euthymic, smiles and laughs. Speech clear and coherent at normal rate and tone. Pleasant demeanor. Engaged and receptive to interventions. Camika shares to be doing well; denies AVH and shares to be medication compliant. Reports  some anxiety and low mood secondary to not working at this time and shares feels will resolve when able to secure a position. Shares x 2 job interviewed the upcoming day. Explored with therapist skills to support in relaxation and reducing nervousness with job interviews. Shares with therapist going outside has been supportive in managing moods and engaged in exploring development of distraction coping skills if AVH should return. Identifies healthy habits to support in maintaining and increasing level of functioning. Denies SI/HI/AVH.  Medication complaint daily; reports coping skills of listening to music and going for walk for AVH and anxious moods.    Suicidal/Homicidal: Nowithout intent/plan  Therapist Response: Therapist engaged [name] in therapy session. Assessed for confidentiality and current location for safety. Completed check in and assessed for current level of functioning, sxs management and presence of stressors. Provided safe space for Sarah Solis to share current functioning and explored current level of functioning. Supported in processing feelings of anxiety and over thinking behaviors. Reviewed deep breathing box breathing technique and ability to engage in distraction coping skills and working through feelings of nervousness. Engaged [name] in exploratin of healthy habits and coping skills that support in stabilization of mental health and level of functioning. Supported in identifying in coping skills for hallucinations if they were to present and ability to reality test. Completed of PHQ and GAD and discussed scores. Reviewed session and provided follow up.  GAD: 6 (decreased from 14) PHQ: 8 (decreased from 13)  Plan: Return again in  x3 weeks.  Diagnosis: Schizophreniform disorder ([place])  Collaboration of Care: Other None  Patient/Guardian was advised Release of Information must be obtained prior to any record release in order to  collaborate their care with an outside provider.  Patient/Guardian was advised if they have not already done so to contact the registration department to sign all necessary forms in order for Sarah Solis to release information regarding their care.   Consent: Patient/Guardian gives verbal consent for treatment and assignment of benefits for services provided during this visit. Patient/Guardian expressed understanding and agreed to proceed.   Stephan Minister Aneta, [facility] 09/30/2022

## 2022-09-30 NOTE — Plan of Care (Signed)
  Problem: Thought Disorders CCP Problem  1 Schizophreniform Goal: LTG: Increase coping skills to promote long-term recovery and improve ability to perform daily activities Outcome: Progressing Goal: STG: Sarah Solis will increase stabilization in sxs AEB taking medications daily with ability to engage in reality testing techniques as needed within the next 6 months  Outcome: Progressing

## 2022-10-19 ENCOUNTER — Ambulatory Visit (INDEPENDENT_AMBULATORY_CARE_PROVIDER_SITE_OTHER): Payer: No Payment, Other | Admitting: Mental Health

## 2022-10-19 DIAGNOSIS — F2081 Schizophreniform disorder: Secondary | ICD-10-CM

## 2022-10-19 NOTE — Plan of Care (Signed)
  Problem: Thought Disorders CCP Problem  1 Schizophreniform Goal: LTG: Increase coping skills to promote long-term recovery and improve ability to perform daily activities Outcome: Progressing Goal: STG: Rashidah will increase stabilization in sxs AEB taking medications daily with ability to engage in reality testing techniques as needed within the next 6 months  Outcome: Progressing

## 2022-10-19 NOTE — Progress Notes (Signed)
THERAPIST PROGRESS NOTE Virtual Visit via Video Note  I connected with Sarah Solis on 10/19/22 at  1:00 PM EST by a video enabled telemedicine application and verified that I am speaking with the correct person using two identifiers.  Location: Patient: home address Provider: home office    I discussed the limitations of evaluation and management by telemedicine and the availability of in person appointments. The patient expressed understanding and agreed to proceed.   I discussed the assessment and treatment plan with the patient. The patient was provided an opportunity to ask questions and all were answered. The patient agreed with the plan and demonstrated an understanding of the instructions.   The patient was advised to call back or seek an in-person evaluation if the symptoms worsen or if the condition fails to improve as anticipated.  I provided 33 minutes of non-face-to-face time during this encounter.   Sarah Solis, [facility]   Session Time: 1:06pm (33 minutes)  Participation Level: Active  Behavioral Response: CasualAlertEuthymic  Type of Therapy: Individual Therapy  Treatment Goals addressed: STG: Sarah Solis will increase stabilization in sxs AEB taking medications daily with ability to engage in reality testing techniques as needed within the next 6 months    STG: Sarah Solis will decreased feelings of anxiety AEB development of x 3 effective coping skills with ability to reframe anxious thoughts 7/7 days within the next 6 months   ProgressTowards Goals: Progressing  Interventions: CBT and Supportive  Summary: Sarah Solis is a 21 y.o. female who presents with  dx of schizophreniform disorder. Sarah Solis presents to session alert and oriented; mood and affect euthymic, smiles and laughs. Speech clear and coherent at normal rate and tone. Pleasant demeanor. Engaged and receptive to interventions. Sarah Solis shares to be doing well; denies AVH and shares to be medication  compliant. No concerns for SI/HI. Shares ongoing feelings of nervousness secondary to looking for work and going on job interviews. Shares upcoming job interview tomorrow for [employer] position. Shares has been spending more time with family with less isolative behaviors. Shares coping with feeling sof anxiety with music, going outside and crossword puzzles. Shares thoughts of doing better than she was previously with holding history of not participating in family functions and has been attending more. Shares working to obtain her drivers permit and enjoying getting dressed and doing her make up with she enjoys. Denies excessive stressors, sleep and appetite reported to be WNL. Denies safety concerns.  Sarah Solis is making progress with goals, stability in moods, medication compliant with no psychotic sxs reported.  Sarah Solis works to manage anxiety with coping and working to reframe anxious thoughts.   Suicidal/Homicidal: Nowithout intent/plan No Therapist Response: Therapist engaged Sarah Solis in therapy session. Assessed for confidentiality and current location for safety. Completed check in and assessed for current level of functioning, sxs management and presence of stressors. Provided safe space for Sarah Solis to share current functioning and explored factors contributing to wellness. Assessed for medication compliance and attendance to ADLs. Explored ability to increase sociability with family members and increasing level of functioning. Explores presence of hobbies and engagement in things in which she enjoys. Encouraged Sarah Solis to continue to work towards personal goals and attend to coping skills and behavioral changes that support wellness. Reviewed session and provided follow up appointment.   Plan: Return again in  x 3 weeks.  Diagnosis: Schizophreniform disorder ([place])  Collaboration of Care: Other None  Patient/Guardian was advised Release of Information must be obtained prior to any record release  in  order to collaborate their care with an outside provider. Patient/Guardian was advised if they have not already done so to contact the registration department to sign all necessary forms in order for [place] to release information regarding their care.   Consent: Patient/Guardian gives verbal consent for treatment and assignment of benefits for services provided during this visit. Patient/Guardian expressed understanding and agreed to proceed.   Sarah Solis [place], [facility] 10/19/2022

## 2022-10-26 ENCOUNTER — Other Ambulatory Visit: Payer: Self-pay

## 2022-10-27 ENCOUNTER — Other Ambulatory Visit: Payer: Self-pay

## 2022-10-27 ENCOUNTER — Telehealth (HOSPITAL_COMMUNITY): Payer: Self-pay | Admitting: *Deleted

## 2022-10-27 NOTE — Telephone Encounter (Signed)
PATIENT  CALLED LVM STATING THAT SHE NEEDED Dr Josephina Shih TO CALL HER REGARDING HER MEDICATION

## 2022-11-09 ENCOUNTER — Telehealth (HOSPITAL_COMMUNITY): Payer: Self-pay | Admitting: Mental Health

## 2022-11-09 ENCOUNTER — Ambulatory Visit (HOSPITAL_COMMUNITY): Payer: No Payment, Other | Admitting: Mental Health

## 2022-11-09 ENCOUNTER — Encounter (HOSPITAL_COMMUNITY): Payer: Self-pay

## 2022-11-09 NOTE — Telephone Encounter (Signed)
Therapist sent link via Caregility for OPT virtual appointment. No response after x 10 minutes. Therapist contacted pt via telephone, no answer. Left message to reschedule

## 2022-11-11 NOTE — Patient Instructions (Incomplete)
Thank you for attending your appointment today.  -- START metformin 500 mg daily with breakfast. After 1 week INCREASE to 500 mg twice daily with breakfast and dinner. -- Try to minimize snacking and eat a balanced diet.  Start incorporating at least 30 minutes of moderate exercise each day.  -- Continue other medications as prescribed.  Please do not make any changes to medications without first discussing with your provider. If you are experiencing a psychiatric emergency, please call 911 or present to your nearest emergency department. Additional crisis, medication management, and therapy resources are included below.  [facility]  [address] [place], [place] [address] [phone] WALK-IN URGENT CARE 24/7 FOR ANYONE [address], [place], [place]  [phone] Fax: [phone] [email or web address] *Interpreters available *Accepts all insurance and uninsured for Urgent Care needs *Accepts Medicaid and uninsured for outpatient treatment (below)      ONLY FOR [facility]  Below:    Outpatient New Patient Assessment/Therapy Walk-ins:        Monday -Thursday 8am until slots are full.        Every Friday 1pm-4pm  (first come, first served)                   New Patient Psychiatry/Medication Management        Monday-Friday 8am-11am (first come, first served)               For all walk-ins we ask that you arrive by 7:15am, because patients will be seen in the order of arrival.

## 2022-11-11 NOTE — Progress Notes (Unsigned)
BH MD Outpatient Progress Note  11/12/2022 2:56 PM Sarah SalisburyQuanna Solis  MRN:  [id number]  Assessment:  Sarah Solis presents for follow-up evaluation in-person. Today, 11/12/22, patient reports continued resolution of previous symptoms of psychosis and reports overall stability of mood and anxiety.  She notably appears brighter and more reactive in affect with more spontaneous conversation and engaged interaction.  Unfortunately, patient has had substantial weight gain of 70 pounds since starting Risperdal - she does identify that some of this weight gain was likely recovering from previous poor p.o. intake during period of active psychotic symptoms but that she is about 40 pounds over her typical healthy weight.  She attributes this to uncontrolled appetite on Risperdal.  Discussed options for management and given significant benefit from Risperdal patient would like to remain on this medication if at all possible; she opted to focus on exercise and dietary changes at this time as well as start metformin.  However, will have low threshold to transition to more weight neutral antipsychotic such as Abilify if these measures are ineffective.  Plan to RTC in 2 months; plan for coverage while this writer is on leave was discussed.   Identifying Information: Sarah Solis is a 21 y.o. female with a history of recently diagnosed schizophreniform disorder, anxiety, and insomnia who is an established patient with [facility] for management of symptoms of psychosis.   Plan:  Schizophreniform disorder Past medication trials: none Status of problem: new problem to this provider Interventions: -- Continue Risperdal 0.5 mg qAM + 1.5 mg qHS -- First episode psychosis workup performed while inpatient September 2023 negative -- ANA-negative, Ceruloplasmin-22.2, RPR non reactive, heavy metal, Vitamin B1 WNL, Head CT noncontrast with no acute findings -- Continue [facility] with Stephan Minister[facility]rika Quinlan,  [facility]   # Anxiety  Insomnia Past medication trials: none Status of problem: new problem to this provider Interventions: -- Continue Remeron 15 mg nightly -- START trazodone 25-50 mg nightly as needed for sleep  -- Risks, benefits, and side effects including but not limited to dizziness, drowsiness, nausea were reviewed with informed consent provided -- Continue Atarax 10 mg TID PRN anxiety   # Vitamin D deficiency Status of problem: acute Interventions: -- Vitamin D 12.89 on 08/09/22; plan to recheck in 3 months once taking Vit D consistently -- Patient completed Vitamin D2 50,000 units weekly for 5 doses; has not started oral supplementation yet and encouraged to start Vitamin D2 800 IU daily at this time  # Weight gain on antipsychotic Past medication trials: none Status of problem: acute Interventions: -- Significant weight gain from 132 lbs in August 2023 to 202 lbs in Dec 2023 (reports typical weight is around 160 lbs) -- Diet and exercise interventions extensively discussed -- START metformin 500 mg daily with breakfast for 1 week then INCREASE to 500 mg BIDcc  -- Risks, benefits, and side effects including but not limited to diarrhea, N/V, abd pain were discussed with informed consent provided -- Low threshold to transition to more weight neutral antipsychotic such as Abilify if above measures ineffective   # Metabolic Monitoring Interventions: -- Last lipid panel (roughly wnl) and A1c (wnl) on 08/04/22  Patient was given contact information for behavioral health clinic and was instructed to call 911 for emergencies.   Subjective:  Chief Complaint:  Chief Complaint  Patient presents with   Medication Management   Interval History:   Reports she has been doing well and that she feels Risperdal has been an effective medication for  her aside from weight gain. Notes medication keeps her calm; denies AVH, IOR, paranoia (last experienced this while in the hospital). Denies  feeling down or depressed. Some anxiety about the future but denies significant distress related to this. Sleep has been "perfect" - getting 7-8 hours nightly. Using trazodone 50 mg for sleep about 2-3 weekly. Denies any adverse effects. Using Atarax about 3x times weekly at night.   Continues to look for a job; was offered a job at a hospital but missed interview. Spends the day doing word searches, spending time outside or going for a walk. Getting along well with family; not as isolative as before. Deleted her social media about 4-5 months ago as she felt it was taking up too much of her time - notes she feels more peaceful. Denies SI or HI.  Has not been taking Vitamin D tablets and was encouraged to do so.  Discussed patient's weight gain, notably from 132 lbs in August to 202 lbs today.  She notes that weight at time of hospitalization was lower than her typical as she had had decreased p.o. intake and needed to gain weight.  Feels like her healthy weight is around 160s.  However notes that her appetite has been significantly increased and has difficulty controlling it -often eating 4 meals a day and snacking in between. Eating a lot of cake, chips, processed foods. Goes for walks occasionally but not daily and denies other forms of exercise.  Discussed options for management including transition to more weight neutral antipsychotic, lifestyle measures, and addition of medication to curb appetite and weight gain.  She feels Risperdal has been of significant benefit and would like to remain on this medication if at all possible.  She was motivated to incorporate diet and exercise changes which were reviewed and start metformin at this time.  Discussed low threshold to transition to more weight neutral antipsychotic if these measures are ineffective.  Visit Diagnosis:    ICD-10-CM   1. Schizophreniform disorder (HCC)  F20.81     2. Anxiety  F41.9     3. Weight gain due to medication  R63.5     T50.905A       Past Psychiatric History:  Diagnoses: schizophreniform disorder, anxiety Suicide attempts: denies Hospitalizations: x1 [facility] 08/06/22-08/13/22 for psychosis Therapy: denies Substance use:  -- EtOH: rarely -- Cannabis: denies; last smoked years ago -- Denies use of cocaine or other stimulants, benzodiazepines, mushrooms, LSD. -- Tobacco: vapes [employer] infrequently  Past Medical History: History reviewed. No pertinent past medical history.  Past Surgical History:  Procedure Laterality Date   INCISION AND DRAINAGE PERIRECTAL ABSCESS Left 10/16/2020   Procedure: IRRIGATION AND DEBRIDEMENT PERIRECTAL ABSCESS;  Surgeon: Abigail Miyamoto, MD;  Location: [facility] OR;  Service: General;  Laterality: Left;    Family Psychiatric History:  Father: schizophrenia; never received medication treatment  Family History:  Family History  Problem Relation Age of Onset   Schizophrenia Father     Social History:  Social History   Socioeconomic History   Marital status: Single    Spouse name: Not on file   Number of children: Not on file   Years of education: Not on file   Highest education level: Not on file  Occupational History   Not on file  Tobacco Use   Smoking status: Never   Smokeless tobacco: Never  Vaping Use   Vaping Use: Some days  Substance and Sexual Activity   Alcohol use: Not Currently   Drug  use: Not Currently   Sexual activity: Not Currently  Other Topics Concern   Not on file  Social History Narrative   Not currently working but looking to return to work.    Lives with mother, mother's husband, 2 sisters, and brother.   Social Determinants of Health   Financial Resource Strain: Medium Risk (08/31/2022)   Overall Financial Resource Strain (CARDIA)    Difficulty of Paying Living Expenses: Somewhat hard  Food Insecurity: No Food Insecurity (08/31/2022)   Hunger Vital Sign    Worried About Running Out of Food in the Last Year: Never true    Ran Out of Food  in the Last Year: Never true  Transportation Needs: Unmet Transportation Needs (08/31/2022)   PRAPARE - Transportation    Lack of Transportation (Medical): Yes    Lack of Transportation (Non-Medical): Yes  Physical Activity: Inactive (08/31/2022)   Exercise Vital Sign    Days of Exercise per Week: 0 days    Minutes of Exercise per Session: 0 min  Stress: Stress Concern Present (08/31/2022)   [employer] of Occupational Health - Occupational Stress Questionnaire    Feeling of Stress : To some extent  Social Connections: Socially Isolated (08/31/2022)   Social Connection and Isolation Panel [NHANES]    Frequency of Communication with Friends and Family: Never    Frequency of Social Gatherings with Friends and Family: Never    Attends Religious Services: 1 to 4 times per year    Active Member of [employer] or Organizations: No    Attends Engineer, structural: Never    Marital Status: Never married    Allergies: No Known Allergies  Current Medications: Current Outpatient Medications  Medication Sig Dispense Refill   metFORMIN (GLUCOPHAGE-XR) 500 MG 24 hr tablet Take 1 tablet (500 mg) daily with breakfast for 1 week then increase to 1 tablet (500 mg) twice daily with breakfast and dinner. 60 tablet 2   traZODone (DESYREL) 50 MG tablet Take 1/2-1 tablet (25-50 mg) nightly as needed for sleep. 30 tablet 2   Ergocalciferol (VITAMIN D2) 10 MCG (400 UNIT) TABS Take 800 Units by mouth in the morning. (2 tablets) 60 tablet 2   mirtazapine (REMERON) 15 MG tablet Take 1 tablet (15 mg total) by mouth at bedtime. 30 tablet 2   risperiDONE (RISPERDAL) 0.5 MG tablet Take 1 tablet (0.5 mg total) by mouth in the morning AND 3 tablets (1.5 mg total) at bedtime. 120 tablet 2   No current facility-administered medications for this visit.    ROS: Denies any side effects to medications including dizziness, abd pain, constipation, breast swelling or nipple discharge, or muscle stiffness.    Objective:  Psychiatric Specialty Exam: Blood pressure 116/60, pulse 75, height 5\' 5"  (1.651 m), weight 202 lb (91.6 kg).Body mass index is 33.61 kg/m.  General Appearance: Casual and Well Groomed  Eye Contact:  Good  Speech:  Clear and Coherent and Normal Rate  Volume:  Normal  Mood:   "good"  Affect:  Appropriate and Euthymic; more reactive this visit  Thought Content:  Denies AVH; paranoia; IOR    Suicidal Thoughts:  No  Homicidal Thoughts:  No  Thought Process:  Linear and logical  Orientation:  Full (Time, Place, and Person)    Memory:   Grossly intact  Judgment:  Good  Insight:  Good  Concentration:  Concentration: Good  Recall:  NA  Fund of Knowledge: Good  Language: Good  Psychomotor Activity:  Normal  Akathisia:  No  AIMS (if indicated): not done  Assets:  Desire for Improvement Housing Leisure Time Physical Health Social Support Talents/Skills Transportation  ADL's:  Intact  Cognition: WNL  Sleep:  Good   PE: General: well-appearing; no acute distress  Pulm: no increased work of breathing on room air  Strength & Muscle Tone: within normal limits Neuro: no focal neurological deficits observed  Gait & Station: normal  Metabolic Disorder Labs: Lab Results  Component Value Date   HGBA1C 5.1 08/04/2022   MPG 99.67 08/04/2022   No results found for: "PROLACTIN" Lab Results  Component Value Date   CHOL 144 08/04/2022   TRIG 30 08/04/2022   HDL 35 (L) 08/04/2022   CHOLHDL 4.1 08/04/2022   VLDL 6 08/04/2022   LDLCALC 103 (H) 08/04/2022   Lab Results  Component Value Date   TSH 1.110 08/04/2022    Therapeutic Level Labs: No results found for: "LITHIUM" No results found for: "VALPROATE" No results found for: "CBMZ"  Screenings: AIMS    Flowsheet Row [facility]  AIMS Total Score 0      AUDIT    Flowsheet Row [facility]  Alcohol Use Disorder Identification Test Final Score (AUDIT) 1      GAD-7    Flowsheet Row Counselor from 09/30/2022 in [facility] Counselor from 08/31/2022 in [facility]  Total GAD-7 Score 6 14      PHQ2-9    Flowsheet Row Counselor from 09/30/2022 in [facility] Counselor from 08/31/2022 in [facility]  PHQ-2 Total Score 2 4  PHQ-9 Total Score 8 13      Flowsheet Row Counselor from 08/31/2022 in [facility]  C-SSRS RISK CATEGORY No Risk No Risk No Risk       Collaboration of Care: Collaboration of Care: Medication Management AEB active medication changes, Psychiatrist AEB established with this provider, and Referral or follow-up with counselor/therapist AEB established with individual psychotherapy  Patient/Guardian was advised Release of Information must be obtained prior to any record release in order to collaborate their care with an outside provider. Patient/Guardian was advised if they have not already done so to contact the registration department to sign all necessary forms in order for [place] to release information regarding their care.   Consent: Patient/Guardian gives verbal consent for treatment and assignment of benefits for services provided during this visit. Patient/Guardian expressed understanding and agreed to proceed.   A total of 40 minutes was spent involved in face to face clinical care, chart review, documentation, counseling, and medication management.   Antwanette Wesche A  11/12/2022, 2:56 PM

## 2022-11-12 ENCOUNTER — Ambulatory Visit (INDEPENDENT_AMBULATORY_CARE_PROVIDER_SITE_OTHER): Payer: No Payment, Other | Admitting: Psychiatry

## 2022-11-12 ENCOUNTER — Encounter (HOSPITAL_COMMUNITY): Payer: Self-pay | Admitting: Psychiatry

## 2022-11-12 ENCOUNTER — Other Ambulatory Visit: Payer: Self-pay

## 2022-11-12 VITALS — BP 116/60 | HR 75 | Ht 65.0 in | Wt 202.0 lb

## 2022-11-12 DIAGNOSIS — T50905A Adverse effect of unspecified drugs, medicaments and biological substances, initial encounter: Secondary | ICD-10-CM | POA: Diagnosis not present

## 2022-11-12 DIAGNOSIS — F2081 Schizophreniform disorder: Secondary | ICD-10-CM | POA: Diagnosis not present

## 2022-11-12 DIAGNOSIS — R635 Abnormal weight gain: Secondary | ICD-10-CM | POA: Diagnosis not present

## 2022-11-12 DIAGNOSIS — F419 Anxiety disorder, unspecified: Secondary | ICD-10-CM

## 2022-11-12 MED ORDER — VITAMIN D2 10 MCG (400 UNIT) PO TABS
800.0000 [IU] | ORAL_TABLET | Freq: Every morning | ORAL | 2 refills | Status: DC
  Filled 2022-11-12: qty 60, fill #0

## 2022-11-12 MED ORDER — METFORMIN HCL ER 500 MG PO TB24
ORAL_TABLET | ORAL | 2 refills | Status: DC
  Filled 2022-11-12 – 2022-11-27 (×2): qty 60, 33d supply, fill #0

## 2022-11-12 MED ORDER — RISPERIDONE 0.5 MG PO TABS
ORAL_TABLET | ORAL | 2 refills | Status: DC
  Filled 2022-11-12 – 2022-11-27 (×2): qty 120, 30d supply, fill #0
  Filled 2023-04-14: qty 120, 30d supply, fill #1

## 2022-11-12 MED ORDER — MIRTAZAPINE 15 MG PO TABS
15.0000 mg | ORAL_TABLET | Freq: Every evening | ORAL | 2 refills | Status: DC
  Filled 2022-11-12 – 2022-11-27 (×2): qty 30, 30d supply, fill #0
  Filled 2023-04-14: qty 30, 30d supply, fill #1

## 2022-11-18 ENCOUNTER — Other Ambulatory Visit: Payer: Self-pay

## 2022-11-27 ENCOUNTER — Other Ambulatory Visit: Payer: Self-pay

## 2022-12-11 ENCOUNTER — Ambulatory Visit (HOSPITAL_COMMUNITY): Payer: No Payment, Other | Admitting: Mental Health

## 2023-01-14 ENCOUNTER — Encounter (HOSPITAL_COMMUNITY): Payer: Medicaid Other | Admitting: Student in an Organized Health Care Education/Training Program

## 2023-01-14 NOTE — Progress Notes (Deleted)
[place] MD/PA/NP OP Progress Note  01/14/2023 11:02 AM Sarah Solis  MRN:  [id number]  Chief Complaint: No chief complaint on file.  HPI: Sarah Solis is a 22 y.o. female with a history of recently diagnosed schizophreniform disorder, anxiety, and insomnia who is an established patient with [facility] for management of symptoms of psychosis.  Visit Diagnosis: No diagnosis found.  Past Psychiatric History: ***  Past Medical History: No past medical history on file.  Past Surgical History:  Procedure Laterality Date   INCISION AND DRAINAGE PERIRECTAL ABSCESS Left 10/16/2020   Procedure: IRRIGATION AND DEBRIDEMENT PERIRECTAL ABSCESS;  Surgeon: Coralie Keens, MD;  Location: [place];  Service: General;  Laterality: Left;    Family Psychiatric History: ***  Family History:  Family History  Problem Relation Age of Onset   Schizophrenia Father     Social History:  Social History   Socioeconomic History   Marital status: Single    Spouse name: Not on file   Number of children: Not on file   Years of education: Not on file   Highest education level: Not on file  Occupational History   Not on file  Tobacco Use   Smoking status: Never   Smokeless tobacco: Never  Vaping Use   Vaping Use: Some days  Substance and Sexual Activity   Alcohol use: Not Currently   Drug use: Not Currently   Sexual activity: Not Currently  Other Topics Concern   Not on file  Social History Narrative   Not currently working but looking to return to work.    Lives with mother, mother's husband, 2 sisters, and brother.   Social Determinants of Health   Financial Resource Strain: Medium Risk (08/31/2022)   Overall Financial Resource Strain (CARDIA)    Difficulty of Paying Living Expenses: Somewhat hard  Food Insecurity: No Food Insecurity (08/31/2022)   Hunger Vital Sign    Worried About Running Out of Food in the Last Year: Never true    Ran Out of Food in the Last Year: Never  true  Transportation Needs: Unmet Transportation Needs (08/31/2022)   PRAPARE - Transportation    Lack of Transportation (Medical): Yes    Lack of Transportation (Non-Medical): Yes  Physical Activity: Inactive (08/31/2022)   Exercise Vital Sign    Days of Exercise per Week: 0 days    Minutes of Exercise per Session: 0 min  Stress: Stress Concern Present (08/31/2022)   [place] Walk    Feeling of Stress : To some extent  Social Connections: Socially Isolated (08/31/2022)   Social Connection and Isolation Panel [NHANES]    Frequency of Communication with Friends and Family: Never    Frequency of Social Gatherings with Friends and Family: Never    Attends Religious Services: 1 to 4 times per year    Active Member of Clubs or Organizations: No    Attends Archivist Meetings: Never    Marital Status: Never married    Allergies: No Known Allergies  Metabolic Disorder Labs: Lab Results  Component Value Date   HGBA1C 5.1 08/04/2022   MPG 99.67 08/04/2022   No results found for: "PROLACTIN" Lab Results  Component Value Date   CHOL 144 08/04/2022   TRIG 30 08/04/2022   HDL 35 (L) 08/04/2022   CHOLHDL 4.1 08/04/2022   VLDL 6 08/04/2022   LDLCALC 103 (H) 08/04/2022   Lab Results  Component Value Date   TSH 1.110 08/04/2022  Therapeutic Level Labs: No results found for: "LITHIUM" No results found for: "VALPROATE" No results found for: "CBMZ"  Current Medications: Current Outpatient Medications  Medication Sig Dispense Refill   Ergocalciferol (VITAMIN D2) 10 MCG (400 UNIT) TABS Take 800 Units by mouth in the morning. (2 tablets) 60 tablet 2   metFORMIN (GLUCOPHAGE-XR) 500 MG 24 hr tablet Take 1 tablet (500 mg) daily with breakfast for 1 week then increase to 1 tablet (500 mg) twice daily with breakfast and dinner. 60 tablet 2   mirtazapine (REMERON) 15 MG tablet Take 1 tablet (15 mg total) by mouth at  bedtime. 30 tablet 2   risperiDONE (RISPERDAL) 0.5 MG tablet Take 1 tablet (0.5 mg total) by mouth in the morning AND 3 tablets (1.5 mg total) at bedtime. 120 tablet 2   traZODone (DESYREL) 50 MG tablet Take 1/2-1 tablet (25-50 mg) nightly as needed for sleep. 30 tablet 2   No current facility-administered medications for this visit.     Musculoskeletal: Strength & Muscle Tone: {desc; muscle tone:32375} Gait & Station: {PE GAIT ED JIRC:78938} Patient leans: {Patient Leans:21022755}  Psychiatric Specialty Exam: Review of Systems  There were no vitals taken for this visit.There is no height or weight on file to calculate BMI.  General Appearance: {Appearance:22683}  Eye Contact:  {BHH EYE CONTACT:22684}  Speech:  {Speech:22685}  Volume:  {Volume (PAA):22686}  Mood:  {BHH MOOD:22306}  Affect:  {Affect (PAA):22687}  Thought Process:  {Thought Process (PAA):22688}  Orientation:  {BHH ORIENTATION (PAA):22689}  Thought Content: {Thought Content:22690}   Suicidal Thoughts:  {ST/HT (PAA):22692}  Homicidal Thoughts:  {ST/HT (PAA):22692}  Memory:  {BHH MEMORY:22881}  Judgement:  {Judgement (PAA):22694}  Insight:  {Insight (PAA):22695}  Psychomotor Activity:  {Psychomotor (PAA):22696}  Concentration:  {Concentration:21399}  Recall:  {BHH GOOD/FAIR/POOR:22877}  Fund of Knowledge: {BHH GOOD/FAIR/POOR:22877}  Language: {BHH GOOD/FAIR/POOR:22877}  Akathisia:  {BHH YES OR NO:22294}  Handed:  {Handed:22697}  AIMS (if indicated): {Desc; done/not:10129}  Assets:  {Assets (PAA):22698}  ADL's:  {BHH BOF'B:51025}  Cognition: {chl bhh cognition:304700322}  Sleep:  {BHH GOOD/FAIR/POOR:22877}   Screenings: AIMS    Flowsheet Row Admission (Discharged) from 08/05/2022 in [place] [facility]  AIMS Total Score 0      AUDIT    Flowsheet Row Admission (Discharged) from 08/05/2022 in [place] [facility]  Alcohol Use Disorder Identification Test  Final Score (AUDIT) 1      GAD-7    Flowsheet Row Counselor from 09/30/2022 in [facility] Counselor from 08/31/2022 in [facility]  Total GAD-7 Score 6 14      PHQ2-9    Flowsheet Row Counselor from 09/30/2022 in [facility] Counselor from 08/31/2022 in [place]  PHQ-2 Total Score 2 4  PHQ-9 Total Score 8 13      Flowsheet Row Counselor from 08/31/2022 in [facility] OP Visit from 08/17/2022 in [place] Admission (Discharged) from 08/05/2022 in [place] [facility]  C-SSRS RISK CATEGORY No Risk No Risk No Risk        Assessment and Plan: ***  Collaboration of Care: Collaboration of Care: {BH OP Collaboration of Care:21014065}  Patient/Guardian was advised Release of Information must be obtained prior to any record release in order to collaborate their care with an outside provider. Patient/Guardian was advised if they have not already done so to contact the registration department to sign  all necessary forms in order for [place] to release information regarding their care.   Consent: Patient/Guardian gives verbal consent for treatment and assignment of benefits for services provided during this visit. Patient/Guardian expressed understanding and agreed to proceed.    Freida Busman, MD 01/14/2023, 11:02 AM

## 2023-04-14 ENCOUNTER — Other Ambulatory Visit: Payer: Self-pay

## 2023-04-16 ENCOUNTER — Other Ambulatory Visit: Payer: Self-pay

## 2023-09-22 ENCOUNTER — Ambulatory Visit (HOSPITAL_COMMUNITY)
Admission: EM | Admit: 2023-09-22 | Discharge: 2023-09-23 | Disposition: A | Discharge status: Other Acute Inpatient Hospital | Payer: MEDICAID | Attending: Psychiatry | Admitting: Psychiatry

## 2023-09-22 DIAGNOSIS — F69 Unspecified disorder of adult personality and behavior: Secondary | ICD-10-CM

## 2023-09-22 DIAGNOSIS — Z91148 Patient's other noncompliance with medication regimen for other reason: Secondary | ICD-10-CM | POA: Insufficient documentation

## 2023-09-22 DIAGNOSIS — Z79899 Other long term (current) drug therapy: Secondary | ICD-10-CM | POA: Diagnosis not present

## 2023-09-22 DIAGNOSIS — F209 Schizophrenia, unspecified: Secondary | ICD-10-CM

## 2023-09-22 DIAGNOSIS — F201 Disorganized schizophrenia: Secondary | ICD-10-CM | POA: Insufficient documentation

## 2023-09-22 MED ORDER — OLANZAPINE 10 MG PO TBDP: 10.0000 mg | ORAL_TABLET | Freq: Three times a day (TID) | ORAL | Status: DC | PRN

## 2023-09-22 MED ORDER — ALUM & MAG HYDROXIDE-SIMETH 200-200-20 MG/5ML PO SUSP: 30.0000 mL | ORAL | Status: DC | PRN

## 2023-09-22 MED ORDER — MAGNESIUM HYDROXIDE 400 MG/5ML PO SUSP: 30.0000 mL | Freq: Every day | ORAL | Status: DC | PRN

## 2023-09-22 MED ORDER — ACETAMINOPHEN 325 MG PO TABS: 650.0000 mg | ORAL_TABLET | Freq: Four times a day (QID) | ORAL | Status: DC | PRN

## 2023-09-22 MED ORDER — ZIPRASIDONE MESYLATE 20 MG IM SOLR: 20.0000 mg | INTRAMUSCULAR | Status: DC | PRN

## 2023-09-22 MED ORDER — LORAZEPAM 1 MG PO TABS
1.0000 mg | ORAL_TABLET | ORAL | Status: AC | PRN
  Administered 2023-09-22: 1 mg via ORAL
  Filled 2023-09-22: qty 1

## 2023-09-22 NOTE — Progress Notes (Signed)
   09/22/23 2150  [facility] Triage Screening (Walk-ins at [facility] only)  How Did You Hear About [place]? Family/Friend  What Is the Reason for Your Visit/Call Today? Pt presents to [facility] voluntarily, accompanied by her mother. Pt reports feeling down and "feeling a mess" over the last two weeks. Pt was not able to give much information as she appeared disorganized in thought, difficulty focusing on questions posed and lack of eye contact. It is unclear if pt is responding to internal stimuli. Pt currently denies SI,HI,AVH and substance/alchohol use.  How Long Has This Been Causing You Problems? 1 wk - 1 month  Have You Recently Had Any Thoughts About Hurting Yourself? No  Are You Planning to Commit Suicide/Harm Yourself At This time? No  Have you Recently Had Thoughts About Hurting Someone [name]? No  Are You Planning To Harm Someone At This Time? No  Are you currently experiencing any auditory, visual or other hallucinations? No (pt denies,but unclear if she is responding to internal stimuli)  Have You Used Any Alcohol or Drugs in the Past 24 Hours? No  Do you have any current medical co-morbidities that require immediate attention? No  Clinician description of patient physical appearance/behavior: lack of eye contact, difficulty expressing concerns  What Do You Feel Would Help You the Most Today? Treatment for Depression or other mood problem  If access to [facility] was not available, would you have sought care in the Emergency Department? No  Determination of Need Urgent (48 hours)  Options For Referral Other: Comment;Medication Management;BH Urgent Care;Inpatient Hospitalization;Outpatient Therapy

## 2023-09-22 NOTE — ED Provider Notes (Signed)
[facility] Continuous Assessment Admission H&P  Date: 09/23/23 Patient Name: Naeemah Rosselot MRN: [id number] Chief Complaint: medication non-compliant and strange behavior  Diagnoses:  Final diagnoses:  Disorganized schizophrenia (HCC)  Behavior concern in adult  H/O medication noncompliance    HPI: Seila Fortman 22 y/o female with a history of schizophrenia, presented to [facility] accompanied by her mother [name], [phone]).  Per the patient's mother patient has been acting strange, walking around the house naked going into rooms waking up other family members.  According to mom patient has been off her medication for 4 months when asked why the patient off her medicine mom stated that when the patient feels better she would stop taking her medications.  According to mom patient was prescribed Risperdal 01 mg during the morning and 3 mg at bedtime.  According to mom she gave the patient medication this morning and this evening before bringing her in.  According to mom patient lives at home with her her husband and 3 other siblings.  Per mom patient was hospitalized a year ago inpatient for similar problems.  Per mom patient does not drink or smoke and does not use any illicit drugs.  According to mom tonight when they were coming patient stated that she did not want to be here tomorrow meaning she did not want to be alive.  Face-to-face observation of patient patient is observed in the room had to be redirected multiple times.  Patient is alert and oriented to person and place.  Patient does seem to be influenced by internal stimuli.  However patient was able to answer a couple questions patient denies current SI, HI, alcohol use.  Patient reports she hears voices but when asked what will the voices saying patient could not articulate and stated she hear multiple voices.  Patient appears to be disorganized however patient is easily redirected and cooperative.  Discussed with mom the  need for inpatient observation and further evaluation.  Mom in agreement with plan of care.  Recommend inpatient observation   Total Time spent with patient: 20 minutes  Musculoskeletal  Strength & Muscle Tone: within normal limits Gait & Station: normal Patient leans: N/A  Psychiatric Specialty Exam  Presentation General Appearance:  Casual  Eye Contact: Fair  Speech: Blocked  Speech Volume: Decreased  Handedness: Right   Mood and Affect  Mood: Euthymic; Depressed  Affect: Constricted   Thought Process  Thought Processes: Disorganized  Descriptions of Associations:Circumstantial  Orientation:Partial  Thought Content:Scattered  Diagnosis of Schizophrenia or Schizoaffective disorder in past: Yes  Duration of Psychotic Symptoms: Greater than six months  Hallucinations:Hallucinations: Auditory Description of Auditory Hallucinations: could not say what the voices where saying  Ideas of Reference:None  Suicidal Thoughts:Suicidal Thoughts: No  Homicidal Thoughts:Homicidal Thoughts: No   Sensorium  Memory: Immediate Poor  Judgment: Poor  Insight: Poor   Executive Functions  Concentration: Fair  Attention Span: Fair  Recall: Poor  Fund of Knowledge: Poor  Language: Fair   Psychomotor Activity  Psychomotor Activity: Psychomotor Activity: Normal   Assets  Assets: Desire for Improvement   Sleep  Sleep: Sleep: Fair Number of Hours of Sleep: 4   Nutritional Assessment (For OBS and FBC admissions only) Has the patient had a weight loss or gain of 10 pounds or more in the last 3 months?: No Has the patient had a decrease in food intake/or appetite?: No Does the patient have dental problems?: No Does the patient have eating habits or behaviors that may be  indicators of an eating disorder including binging or inducing vomiting?: No Has the patient recently lost weight without trying?: 0 Has the patient been eating poorly  because of a decreased appetite?: 0 Malnutrition Screening Tool Score: 0    Physical Exam HENT:     Head: Normocephalic.     Nose: Nose normal.  Eyes:     Pupils: Pupils are equal, round, and reactive to light.  Cardiovascular:     Rate and Rhythm: Tachycardia present.  Pulmonary:     Effort: Pulmonary effort is normal.  Musculoskeletal:        General: Normal range of motion.     Cervical back: Normal range of motion.  Neurological:     General: No focal deficit present.     Mental Status: She is alert.  Psychiatric:        Mood and Affect: Mood normal.        Behavior: Behavior normal.        Thought Content: Thought content normal.        Judgment: Judgment normal.    Review of Systems  Constitutional: Negative.   HENT: Negative.    Eyes: Negative.   Respiratory: Negative.    Cardiovascular: Negative.   Gastrointestinal: Negative.   Genitourinary: Negative.   Musculoskeletal: Negative.   Skin: Negative.   Neurological: Negative.   Psychiatric/Behavioral:  Positive for hallucinations. The patient is nervous/anxious.     Blood pressure (!) 130/98, pulse (!) 125, temperature 99 F (37.2 C), temperature source Oral, resp. rate 20, SpO2 98%. There is no height or weight on file to calculate BMI.  Past Psychiatric History: Schizophrenia  Is the patient at risk to self? Yes  Has the patient been a risk to self in the past 6 months? No .    Has the patient been a risk to self within the distant past? No   Is the patient a risk to others? No   Has the patient been a risk to others in the past 6 months? No   Has the patient been a risk to others within the distant past? No   Past Medical History: See chart  Family History: Unknown  Social History: Unknown  Last Labs:  No visits with results within 6 Month(s) from this visit.  Latest known visit with results is:  Admission on 08/17/2022, Discharged on 08/18/2022  Component Date Value Ref Range Status   SARS  Coronavirus 2 by RT PCR 08/17/2022 NEGATIVE  NEGATIVE Final   Comment: (NOTE) SARS-CoV-2 target nucleic acids are NOT DETECTED.  The SARS-CoV-2 RNA is generally detectable in upper respiratory specimens during the acute phase of infection. The lowest concentration of SARS-CoV-2 viral copies this assay can detect is 138 copies/mL. A negative result does not preclude SARS-Cov-2 infection and should not be used as the sole basis for treatment or other patient management decisions. A negative result may occur with  improper specimen collection/handling, submission of specimen other than nasopharyngeal swab, presence of viral mutation(s) within the areas targeted by this assay, and inadequate number of viral copies(<138 copies/mL). A negative result must be combined with clinical observations, patient history, and epidemiological information. The expected result is Negative.  Fact Sheet for Patients:  BloggerCourse.com  Fact Sheet for Healthcare Providers:  SeriousBroker.it  This test is no                          t yet approved or cleared by the  [place] [employer] and  has been authorized for detection and/or diagnosis of SARS-CoV-2 by FDA under an TEFL teacher (EUA). This EUA will remain  in effect (meaning this test can be used) for the duration of the COVID-19 declaration under Section 564(b)(1) of the Act, 21 U.S.C.section 360bbb-3(b)(1), unless the authorization is terminated  or revoked sooner.       Influenza A by PCR 08/17/2022 NEGATIVE  NEGATIVE Final   Influenza B by PCR 08/17/2022 NEGATIVE  NEGATIVE Final   Comment: (NOTE) The Xpert Xpress SARS-CoV-2/FLU/RSV plus assay is intended as an aid in the diagnosis of influenza from Nasopharyngeal swab specimens and should not be used as a sole basis for treatment. Nasal washings and aspirates are unacceptable for Xpert Xpress SARS-CoV-2/FLU/RSV testing.  Fact  Sheet for Patients: BloggerCourse.com  Fact Sheet for Healthcare Providers: SeriousBroker.it  This test is not yet approved or cleared by the [place] FDA and has been authorized for detection and/or diagnosis of SARS-CoV-2 by FDA under an Emergency Use Authorization (EUA). This EUA will remain in effect (meaning this test can be used) for the duration of the COVID-19 declaration under Section 564(b)(1) of the Act, 21 U.S.C. section 360bbb-3(b)(1), unless the authorization is terminated or revoked.  Performed at [facility], [address], [place], [place] [address]    WBC 08/17/2022 7.0  4.0 - 10.5 K/uL Final   RBC 08/17/2022 4.27  3.87 - 5.11 MIL/uL Final   Hemoglobin 08/17/2022 11.4 (L)  12.0 - 15.0 g/dL Final   HCT 60/45/4098 36.5  36.0 - 46.0 % Final   MCV 08/17/2022 85.5  80.0 - 100.0 fL Final   MCH 08/17/2022 26.7  26.0 - 34.0 pg Final   MCHC 08/17/2022 31.2  30.0 - 36.0 g/dL Final   RDW 11/91/4782 13.5  11.5 - 15.5 % Final   Platelets 08/17/2022 260  150 - 400 K/uL Final   nRBC 08/17/2022 0.0  0.0 - 0.2 % Final   Neutrophils Relative % 08/17/2022 72  % Final   Neutro Abs 08/17/2022 4.9  1.7 - 7.7 K/uL Final   Lymphocytes Relative 08/17/2022 24  % Final   Lymphs Abs 08/17/2022 1.7  0.7 - 4.0 K/uL Final   Monocytes Relative 08/17/2022 4  % Final   Monocytes Absolute 08/17/2022 0.3  0.1 - 1.0 K/uL Final   Eosinophils Relative 08/17/2022 0  % Final   Eosinophils Absolute 08/17/2022 0.0  0.0 - 0.5 K/uL Final   Basophils Relative 08/17/2022 0  % Final   Basophils Absolute 08/17/2022 0.0  0.0 - 0.1 K/uL Final   Immature Granulocytes 08/17/2022 0  % Final   Abs Immature Granulocytes 08/17/2022 0.02  0.00 - 0.07 K/uL Final   Performed at [facility], [address] [place], [place] [address]   Sodium 08/17/2022 139  135 - 145 mmol/L Final   Potassium 08/17/2022 4.0  3.5 - 5.1 mmol/L Final   Chloride  08/17/2022 107  98 - 111 mmol/L Final   CO2 08/17/2022 25  22 - 32 mmol/L Final   Glucose, Bld 08/17/2022 87  70 - 99 mg/dL Final   Glucose reference range applies only to samples taken after fasting for at least 8 hours.   BUN 08/17/2022 10  6 - 20 mg/dL Final   Creatinine, Ser 08/17/2022 0.54  0.44 - 1.00 mg/dL Final   Calcium 30/86/5784 9.6  8.9 - 10.3 mg/dL Final   Total Protein 69/62/9528 7.2  6.5 -  8.1 g/dL Final   Albumin 40/98/1191 3.9  3.5 - 5.0 g/dL Final   AST 47/82/9562 23  15 - 41 U/L Final   ALT 08/17/2022 34  0 - 44 U/L Final   Alkaline Phosphatase 08/17/2022 36 (L)  38 - 126 U/L Final   Total Bilirubin 08/17/2022 0.4  0.3 - 1.2 mg/dL Final   GFR, Estimated 08/17/2022 >60  >60 mL/min Final   Comment: (NOTE) Calculated using the CKD-EPI Creatinine Equation (2021)    Anion gap 08/17/2022 7  5 - 15 Final   Performed at [facility], [address] [place], [place] [address]   Alcohol, Ethyl (B) 08/17/2022 <10  <10 mg/dL Final   Comment: (NOTE) Lowest detectable limit for serum alcohol is 10 mg/dL.  For medical purposes only. Performed at [facility], [address], [place], [place] [address]    POC Amphetamine UR 08/17/2022 None Detected  NONE DETECTED (Cut Off Level 1000 ng/mL) Final   POC Secobarbital (BAR) 08/17/2022 None Detected  NONE DETECTED (Cut Off Level 300 ng/mL) Final   POC Buprenorphine (BUP) 08/17/2022 None Detected  NONE DETECTED (Cut Off Level 10 ng/mL) Final   POC Oxazepam (BZO) 08/17/2022 None Detected  NONE DETECTED (Cut Off Level 300 ng/mL) Final   POC Cocaine UR 08/17/2022 None Detected  NONE DETECTED (Cut Off Level 300 ng/mL) Final   POC Methamphetamine UR 08/17/2022 None Detected  NONE DETECTED (Cut Off Level 1000 ng/mL) Final   POC Morphine 08/17/2022 None Detected  NONE DETECTED (Cut Off Level 300 ng/mL) Final   POC Methadone UR 08/17/2022 None Detected  NONE DETECTED (Cut Off Level 300 ng/mL) Final   POC Oxycodone UR 08/17/2022  None Detected  NONE DETECTED (Cut Off Level 100 ng/mL) Final   POC Marijuana UR 08/17/2022 None Detected  NONE DETECTED (Cut Off Level 50 ng/mL) Final   SARSCOV2ONAVIRUS 2 AG 08/17/2022 NEGATIVE  NEGATIVE Final   Comment: (NOTE) SARS-CoV-2 antigen NOT DETECTED.   Negative results are presumptive.  Negative results do not preclude SARS-CoV-2 infection and should not be used as the sole basis for treatment or other patient management decisions, including infection  control decisions, particularly in the presence of clinical signs and  symptoms consistent with COVID-19, or in those who have been in contact with the virus.  Negative results must be combined with clinical observations, patient history, and epidemiological information. The expected result is Negative.  Fact Sheet for Patients: https://www.jennings-kim.com/  Fact Sheet for Healthcare Providers: https://alexander-rogers.biz/  This test is not yet approved or cleared by the [place] FDA and  has been authorized for detection and/or diagnosis of SARS-CoV-2 by FDA under an Emergency Use Authorization (EUA).  This EUA will remain in effect (meaning this test can be used) for the duration of  the COV                          ID-19 declaration under Section 564(b)(1) of the Act, 21 U.S.C. section 360bbb-3(b)(1), unless the authorization is terminated or revoked sooner.     Preg Test, Ur 08/17/2022 NEGATIVE  NEGATIVE Final   Comment:        THE SENSITIVITY OF THIS METHODOLOGY IS >24 mIU/mL     Allergies: Patient has no known allergies.  Medications:  Facility Ordered Medications  Medication   acetaminophen (TYLENOL) tablet 650 mg   alum & mag hydroxide-simeth (MAALOX/MYLANTA) 200-200-20 MG/5ML suspension 30 mL   magnesium hydroxide (  MILK OF MAGNESIA) suspension 30 mL   OLANZapine zydis (ZYPREXA) disintegrating tablet 10 mg   And   [COMPLETED] LORazepam (ATIVAN) tablet 1 mg   And    ziprasidone (GEODON) injection 20 mg   PTA Medications  Medication Sig   traZODone (DESYREL) 50 MG tablet Take 1/2-1 tablet (25-50 mg) nightly as needed for sleep.   Ergocalciferol (VITAMIN D2) 10 MCG (400 UNIT) TABS Take 800 Units by mouth in the morning. (2 tablets)      Medical Decision Making  Inpatient observation  Lab Orders         CBC with Differential/Platelet         Comprehensive metabolic panel         Hemoglobin A1c         Ethanol         Lipid panel         TSH         POC urine preg, ED         POCT Urine Drug Screen - (I-Screen)      Meds ordered this encounter  Medications   acetaminophen (TYLENOL) tablet 650 mg   alum & mag hydroxide-simeth (MAALOX/MYLANTA) 200-200-20 MG/5ML suspension 30 mL   magnesium hydroxide (MILK OF MAGNESIA) suspension 30 mL   AND Linked Order Group    OLANZapine zydis (ZYPREXA) disintegrating tablet 10 mg    LORazepam (ATIVAN) tablet 1 mg    ziprasidone (GEODON) injection 20 mg     Recommendations  Based on my evaluation the patient does not appear to have an emergency medical condition.  [name], NP 09/23/23  4:49 AM

## 2023-09-22 NOTE — BH Assessment (Signed)
Comprehensive Clinical Assessment (CCA) Note   09/22/2023 Nelwyn Salisbury [id number]  Disposition: [name], NP recommends continuous observation. Pt will be seen by psychiatry in AM.     The patient demonstrates the following risk factors for suicide: Chronic risk factors for suicide include: psychiatric disorder of Schizophrenia . Acute risk factors for suicide include: unemployment. Protective factors for this patient include: positive social support. Considering these factors, the overall suicide risk at this point appears to be low. Patient is not appropriate for outpatient follow up.   Pt presents to [facility] voluntarily, accompanied by her mother. Pt reports feeling down and "feeling a mess" over the last two weeks. Pt was not able to give much information as she appeared disorganized in thought, difficulty focusing on questions posed and lack of eye contact. It is unclear if pt is responding to internal stimuli. Pt currently denies SI,HI,AVH and substance/alchohol use.  Pt was alert and cooperative. Pt was dressed disheveled , alert, and disoriented with slow speech and?normal?motor behavior. Eye contact is good. Pt's mood is depressed, and affect is anxious. Thought process is disorganized and relevant. Pt's insight is?fair?and judgement is poor. There is an indication pt is currently responding to internal stimuli or experiencing delusional thought content. Pt was cooperative throughout assessment.   Chief Complaint: No chief complaint on file.  Visit Diagnosis:   Schizophrenia    CCA Screening, Triage and Referral (STR)  Patient Reported Information How did you hear about [place]? Family/Friend  What Is the Reason for Your Visit/Call Today? Pt presents to [facility] voluntarily, accompanied by her mother. Pt reports feeling down and "feeling a mess" over the last two weeks. Pt was not able to give much information as she appeared disorganized in thought, difficulty focusing on questions posed  and lack of eye contact. It is unclear if pt is responding to internal stimuli. Pt currently denies SI,HI,AVH and substance/alchohol use.  How Long Has This Been Causing You Problems? 1 wk - 1 month  What Do You Feel Would Help You the Most Today? Treatment for Depression or other mood problem   Have You Recently Had Any Thoughts About Hurting Yourself? No  Are You Planning to Commit Suicide/Harm Yourself At This time? No   Flowsheet Row ED from 09/22/2023 in [facility] Counselor from 08/31/2022 in [facility] OP Visit from 08/17/20[facility]  C-SSRS RISK CATEGORY No Risk No Risk No Risk       Have you Recently Had Thoughts About Hurting Someone [name]? No  Are You Planning to Harm Someone at This Time? No  Explanation: Pt denies HI   Have You Used Any Alcohol or Drugs in the Past 24 Hours? No  What Did You Use and How Much? Pt denies alcohol and drug use.   Do You Currently Have a Therapist/Psychiatrist? -- (UTA)  Name of Therapist/Psychiatrist: Name of Therapist/Psychiatrist: UTA   Have You Been Recently Discharged From Any Office Practice or Programs? No  Explanation of Discharge From Practice/Program: n/a     CCA Screening Triage Referral Assessment Type of Contact: Face-to-Face  Telemedicine Service Delivery:   Is this Initial or Reassessment?   Date Telepsych consult ordered in [facility]:    Time Telepsych consult ordered in [facility]:    Location of Assessment: [facility]  Provider Location: [facility] Assessment Services   Collateral Involvement: None   Does Patient Have a Automotive engineer Guardian? No  Legal  Guardian Contact Information: n/a  Copy of Legal Guardianship Form: -- (n/a)  Legal Guardian Notified of Arrival: -- (n/a)  Legal Guardian Notified of Pending Discharge: -- (n/a)  If Minor and Not Living with Parent(s), Who has Custody?  n/a  Is CPS involved or ever been involved? Never  Is APS involved or ever been involved? Never   Patient Determined To Be At Risk for Harm To Self or Others Based on Review of Patient Reported Information or Presenting Complaint? No  Method: No Plan  Availability of Means: No access or NA  Intent: Vague intent or NA  Notification Required: No need or identified person  Additional Information for Danger to Others Potential: -- (n/a)  Additional Comments for Danger to Others Potential: n/a  Are There Guns or Other Weapons in Your Home? No  Types of Guns/Weapons: Pt denies access to guns/weapons  Are These Weapons Safely Secured?                            Yes  Who Could Verify You Are Able To Have These Secured: Pt denies access to guns/weapons  Do You Have any Outstanding Charges, Pending Court Dates, Parole/Probation? UTA  Contacted To Inform of Risk of Harm To Self or Others: -- (n/a)    Does Patient Present under Involuntary Commitment? No    [place] of Residence: [place]   Patient Currently Receiving the Following Services: Not Receiving Services   Determination of Need: Urgent (48 hours)   Options For Referral: Other: Comment; Medication Management; BH Urgent Care; Inpatient Hospitalization; Outpatient Therapy     CCA Biopsychosocial Patient Reported Schizophrenia/Schizoaffective Diagnosis in Past: Yes   Strengths: UTA   Mental Health Symptoms Depression:   Change in energy/activity; Fatigue; Hopelessness; Worthlessness; Difficulty Concentrating (decreased appetite (lost 30 lbs) hx of suicidal thoughts; declines attempts)   Duration of Depressive symptoms:  Duration of Depressive Symptoms: Greater than two weeks   Mania:   None   Anxiety:    Worrying; Restlessness   Psychosis:   Hallucinations; Delusions (Pt appeared to be responding to internal stimuli.)   Duration of Psychotic symptoms:  Duration of Psychotic Symptoms: Greater than  six months   Trauma:   None   Obsessions:   None   Compulsions:   None   Inattention:   None   Hyperactivity/Impulsivity:   None   Oppositional/Defiant Behaviors:   None   Emotional Irregularity:   None   Other Mood/Personality Symptoms:   NA    Mental Status Exam Appearance and self-care  Stature:   Average   Weight:   Average weight   Clothing:   Disheveled   Grooming:   Normal   Cosmetic use:   Age appropriate   Posture/gait:   Normal   Motor activity:   Slowed   Sensorium  Attention:   Normal   Concentration:   Normal   Orientation:   X5   Recall/memory:   Defective in Recent   Affect and Mood  Affect:   Flat; Depressed   Mood:   Dysphoric   Relating  Eye contact:   Normal   Facial expression:   Depressed   Attitude toward examiner:   Cooperative   Thought and Language  Speech flow:  Clear and Coherent; Slow   Thought content:   Appropriate to Mood and Circumstances   Preoccupation:   None   Hallucinations:   None   Organization:   Goal-directed; Coherent  Company secretary of Knowledge:   Good   Intelligence:   Average   Abstraction:   Normal   Judgement:   Good   Reality Testing:   Realistic   Insight:   Fair   Decision Making:   Normal   Social Functioning  Social Maturity:   Isolates   Social Judgement:   Normal   Stress  Stressors:   Other (Comment) (None identified)   Coping Ability:   Overwhelmed   Skill Deficits:   Interpersonal   Supports:   Family     Religion: Religion/Spirituality Are You A Religious Person?: Yes What is Your Religious Affiliation?: Christian How Might This Affect Treatment?: n/a  Leisure/Recreation: Leisure / Recreation Do You Have Hobbies?: No  Exercise/Diet: Exercise/Diet Do You Exercise?: No Have You Gained or Lost A Significant Amount of Weight in the Past Six Months?: No Number of Pounds Lost?:  (n/a) Do You Follow a  Special Diet?: No Do You Have Any Trouble Sleeping?: No Explanation of Sleeping Difficulties: n/a   CCA Employment/Education Employment/Work Situation: Employment / Work Situation Employment Situation: Unemployed (Shares to be starting a new position at [employer]- full time work) Patient's Job has Been Impacted by Current Illness: No Has Patient ever Been in Equities trader?: No  Education: Education Is Patient Currently Attending School?: No Last Grade Completed: 12 Did You Product manager?: No Did You Have An Individualized Education Program (IIEP): No Did You Have Any Difficulty At [employer]?: No Patient's Education Has Been Impacted by Current Illness: No   CCA Family/Childhood History Family and Relationship History: Family history Marital status: Single Does patient have children?: No  Childhood History:  Childhood History By whom was/is the patient raised?: Mother, Father Did patient suffer any verbal/emotional/physical/sexual abuse as a child?: No Did patient suffer from severe childhood neglect?: No Has patient ever been sexually abused/assaulted/raped as an adolescent or adult?: No Was the patient ever a victim of a crime or a disaster?: No Witnessed domestic violence?: No Has patient been affected by domestic violence as an adult?: No Description of domestic violence: n/a       CCA Substance Use Alcohol/Drug Use: Alcohol / Drug Use Pain Medications: Denies use Prescriptions: Denies use Over the Counter: Denies use History of alcohol / drug use?: No history of alcohol / drug abuse Longest period of sobriety (when/how long): NA Negative Consequences of Use:  (n/a) Withdrawal Symptoms:  (n/a)                         ASAM's:  Six Dimensions of Multidimensional Assessment  Dimension 1:  Acute Intoxication and/or Withdrawal Potential:   Dimension 1:  Description of individual's past and current experiences of substance use and withdrawal:  (n/a)   Dimension 2:  Biomedical Conditions and Complications:   Dimension 2:  Description of patient's biomedical conditions and  complications:  (n/a)  Dimension 3:  Emotional, Behavioral, or Cognitive Conditions and Complications:  Dimension 3:  Description of emotional, behavioral, or cognitive conditions and complications:  (n/a)  Dimension 4:  Readiness to Change:  Dimension 4:  Description of Readiness to Change criteria:  (n/a)  Dimension 5:  Relapse, Continued use, or Continued Problem Potential:  Dimension 5:  Relapse, continued use, or continued problem potential critiera description:  (n/a)  Dimension 6:  Recovery/Living Environment:  Dimension 6:  Recovery/Iiving environment criteria description:  (n/a)  ASAM Severity Score:    ASAM Recommended Level of Treatment:  ASAM Recommended Level of Treatment:  (n/a)   Substance use Disorder (SUD) Substance Use Disorder (SUD)  Checklist Symptoms of Substance Use:  (n/a)  Recommendations for Services/Supports/Treatments: Recommendations for Services/Supports/Treatments Recommendations For Services/Supports/Treatments: Inpatient Hospitalization  Discharge Disposition:    DSM5 Diagnoses: Patient Active Problem List   Diagnosis Date Noted   Weight gain due to medication 11/12/2022   Insomnia 08/08/2022   Anxiety 08/08/2022   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

## 2023-09-23 ENCOUNTER — Other Ambulatory Visit: Payer: Self-pay

## 2023-09-23 ENCOUNTER — Inpatient Hospital Stay (HOSPITAL_COMMUNITY)
Admission: AD | Admit: 2023-09-23 | Discharge: 2023-10-02 | DRG: 885 | Disposition: A | Discharge status: Home / Self Care | Payer: MEDICAID | Source: Intra-hospital | Attending: Psychiatry | Admitting: Psychiatry

## 2023-09-23 ENCOUNTER — Encounter (HOSPITAL_COMMUNITY): Payer: Self-pay | Admitting: Behavioral Health

## 2023-09-23 DIAGNOSIS — F419 Anxiety disorder, unspecified: Secondary | ICD-10-CM | POA: Diagnosis present

## 2023-09-23 DIAGNOSIS — T43596A Underdosing of other antipsychotics and neuroleptics, initial encounter: Secondary | ICD-10-CM | POA: Diagnosis present

## 2023-09-23 DIAGNOSIS — F203 Undifferentiated schizophrenia: Secondary | ICD-10-CM | POA: Diagnosis present

## 2023-09-23 DIAGNOSIS — Z79899 Other long term (current) drug therapy: Secondary | ICD-10-CM | POA: Diagnosis not present

## 2023-09-23 DIAGNOSIS — E669 Obesity, unspecified: Secondary | ICD-10-CM | POA: Diagnosis present

## 2023-09-23 DIAGNOSIS — F17290 Nicotine dependence, other tobacco product, uncomplicated: Secondary | ICD-10-CM | POA: Diagnosis present

## 2023-09-23 DIAGNOSIS — F209 Schizophrenia, unspecified: Principal | ICD-10-CM | POA: Diagnosis present

## 2023-09-23 DIAGNOSIS — Z5982 Transportation insecurity: Secondary | ICD-10-CM

## 2023-09-23 DIAGNOSIS — G4700 Insomnia, unspecified: Secondary | ICD-10-CM | POA: Diagnosis present

## 2023-09-23 DIAGNOSIS — I472 Ventricular tachycardia, unspecified: Secondary | ICD-10-CM | POA: Diagnosis present

## 2023-09-23 DIAGNOSIS — Z91128 Patient's intentional underdosing of medication regimen for other reason: Secondary | ICD-10-CM

## 2023-09-23 DIAGNOSIS — Z91410 Personal history of adult physical and sexual abuse: Secondary | ICD-10-CM

## 2023-09-23 DIAGNOSIS — Z818 Family history of other mental and behavioral disorders: Secondary | ICD-10-CM | POA: Diagnosis not present

## 2023-09-23 DIAGNOSIS — I4729 Other ventricular tachycardia: Secondary | ICD-10-CM | POA: Diagnosis present

## 2023-09-23 LAB — CBC WITH DIFFERENTIAL/PLATELET
Abs Immature Granulocytes: 0.04 10*3/uL (ref 0.00–0.07)
Basophils Absolute: 0.1 10*3/uL (ref 0.0–0.1)
Basophils Relative: 0 %
Eosinophils Absolute: 0.1 10*3/uL (ref 0.0–0.5)
Eosinophils Relative: 1 %
HCT: 40.4 % (ref 36.0–46.0)
Hemoglobin: 12.8 g/dL (ref 12.0–15.0)
Immature Granulocytes: 0 %
Lymphocytes Relative: 17 %
Lymphs Abs: 2.4 10*3/uL (ref 0.7–4.0)
MCH: 25.7 pg — ABNORMAL LOW (ref 26.0–34.0)
MCHC: 31.7 g/dL (ref 30.0–36.0)
MCV: 81 fL (ref 80.0–100.0)
Monocytes Absolute: 0.7 10*3/uL (ref 0.1–1.0)
Monocytes Relative: 5 %
Neutro Abs: 10.7 10*3/uL — ABNORMAL HIGH (ref 1.7–7.7)
Neutrophils Relative %: 77 %
Platelets: 298 10*3/uL (ref 150–400)
RBC: 4.99 MIL/uL (ref 3.87–5.11)
RDW: 13.7 % (ref 11.5–15.5)
WBC: 14.1 10*3/uL — ABNORMAL HIGH (ref 4.0–10.5)
nRBC: 0 % (ref 0.0–0.2)

## 2023-09-23 LAB — COMPREHENSIVE METABOLIC PANEL
ALT: 28 U/L (ref 0–44)
AST: 44 U/L — ABNORMAL HIGH (ref 15–41)
Albumin: 4.4 g/dL (ref 3.5–5.0)
Alkaline Phosphatase: 73 U/L (ref 38–126)
Anion gap: 13 (ref 5–15)
BUN: 8 mg/dL (ref 6–20)
CO2: 25 mmol/L (ref 22–32)
Calcium: 9.3 mg/dL (ref 8.9–10.3)
Chloride: 102 mmol/L (ref 98–111)
Creatinine, Ser: 0.6 mg/dL (ref 0.44–1.00)
GFR, Estimated: >60 mL/min (ref >60)
Glucose, Bld: 93 mg/dL (ref 70–99)
Potassium: 3.4 mmol/L — ABNORMAL LOW (ref 3.5–5.1)
Sodium: 140 mmol/L (ref 135–145)
Total Bilirubin: 0.6 mg/dL (ref 0.3–1.2)
Total Protein: 8.2 g/dL — ABNORMAL HIGH (ref 6.5–8.1)

## 2023-09-23 LAB — ETHANOL: Alcohol, Ethyl (B): <10 mg/dL (ref <10)

## 2023-09-23 LAB — TSH: TSH: 1.308 u[IU]/mL (ref 0.350–4.500)

## 2023-09-23 LAB — LIPID PANEL
Cholesterol: 117 mg/dL (ref 0–200)
HDL: 34 mg/dL — ABNORMAL LOW (ref >40)
LDL Cholesterol: 74 mg/dL (ref 0–99)
Total CHOL/HDL Ratio: 3.4 {ratio}
Triglycerides: 45 mg/dL (ref <150)
VLDL: 9 mg/dL (ref 0–40)

## 2023-09-23 LAB — HEMOGLOBIN A1C
Hgb A1c MFr Bld: 5.6 % (ref 4.8–5.6)
Mean Plasma Glucose: 114.02 mg/dL

## 2023-09-23 MED ORDER — RISPERIDONE 0.5 MG PO TABS
0.5000 mg | ORAL_TABLET | Freq: Two times a day (BID) | ORAL | Status: DC
  Administered 2023-09-23: 0.5 mg via ORAL
  Filled 2023-09-23: qty 1

## 2023-09-23 MED ORDER — HYDROXYZINE HCL 25 MG PO TABS
25.0000 mg | ORAL_TABLET | Freq: Three times a day (TID) | ORAL | Status: DC | PRN
  Administered 2023-09-27 – 2023-10-01 (×5): 25 mg via ORAL
  Filled 2023-09-23 (×5): qty 1

## 2023-09-23 MED ORDER — ALUM & MAG HYDROXIDE-SIMETH 200-200-20 MG/5ML PO SUSP: 30.0000 mL | ORAL | Status: DC | PRN

## 2023-09-23 MED ORDER — MIRTAZAPINE 15 MG PO TABS
15.0000 mg | ORAL_TABLET | Freq: Every day | ORAL | Status: DC
  Administered 2023-09-23: 15 mg via ORAL
  Filled 2023-09-23 (×4): qty 1

## 2023-09-23 MED ORDER — LORAZEPAM 2 MG/ML IJ SOLN: 2.0000 mg | Freq: Three times a day (TID) | INTRAMUSCULAR | Status: DC | PRN

## 2023-09-23 MED ORDER — ACETAMINOPHEN 325 MG PO TABS: 650.0000 mg | ORAL_TABLET | Freq: Four times a day (QID) | ORAL | Status: DC | PRN

## 2023-09-23 MED ORDER — MAGNESIUM HYDROXIDE 400 MG/5ML PO SUSP: 30.0000 mL | Freq: Every day | ORAL | Status: DC | PRN

## 2023-09-23 MED ORDER — DIPHENHYDRAMINE HCL 50 MG/ML IJ SOLN: 50.0000 mg | Freq: Three times a day (TID) | INTRAMUSCULAR | Status: DC | PRN

## 2023-09-23 MED ORDER — RISPERIDONE 0.5 MG PO TABS: 0.5000 mg | ORAL_TABLET | Freq: Two times a day (BID) | ORAL | Status: DC

## 2023-09-23 MED ORDER — INFLUENZA VIRUS VACC SPLIT PF (FLUZONE) 0.5 ML IM SUSY
0.5000 mL | PREFILLED_SYRINGE | INTRAMUSCULAR | Status: DC
  Filled 2023-09-23: qty 0.5

## 2023-09-23 MED ORDER — HALOPERIDOL 5 MG PO TABS: 5.0000 mg | ORAL_TABLET | Freq: Three times a day (TID) | ORAL | Status: DC | PRN

## 2023-09-23 MED ORDER — DIPHENHYDRAMINE HCL 25 MG PO CAPS: 50.0000 mg | ORAL_CAPSULE | Freq: Three times a day (TID) | ORAL | Status: DC | PRN

## 2023-09-23 MED ORDER — LORAZEPAM 1 MG PO TABS: 2.0000 mg | ORAL_TABLET | Freq: Three times a day (TID) | ORAL | Status: DC | PRN

## 2023-09-23 MED ORDER — HALOPERIDOL LACTATE 5 MG/ML IJ SOLN: 5.0000 mg | Freq: Three times a day (TID) | INTRAMUSCULAR | Status: DC | PRN

## 2023-09-23 MED ORDER — MIRTAZAPINE 15 MG PO TABS: 15.0000 mg | ORAL_TABLET | Freq: Every day | ORAL | Status: DC

## 2023-09-23 MED ORDER — RISPERIDONE 0.5 MG PO TABS
0.5000 mg | ORAL_TABLET | Freq: Two times a day (BID) | ORAL | Status: DC
  Administered 2023-09-23 – 2023-09-24 (×2): 0.5 mg via ORAL
  Filled 2023-09-23 (×7): qty 1

## 2023-09-23 NOTE — Discharge Instructions (Addendum)
Transfer to [facility]

## 2023-09-23 NOTE — ED Notes (Signed)
Pt A&O x 3, no distress noted, slow to respond to questions, calm & cooperative.  Monitoring for safety.

## 2023-09-23 NOTE — ED Notes (Signed)
Report called to [facility]

## 2023-09-23 NOTE — ED Provider Notes (Addendum)
FBC/OBS ASAP Discharge Summary  Date and Time: 09/23/2023 9:54 AM  Name: Sarah Solis  MRN:  [id number]   Discharge Diagnoses:  Final diagnoses:  Schizophrenia, unspecified type [facility])    Subjective: Patient seen and evaluated face-to-face by this provider, and chart reviewed. On evaluation, patient is sitting upright in the recliner in no acute distress. She is alert and oriented x 4. Her thought process is linear and speech is clear and coherent at a decreased tone. Patient denies SI/HI/AVH. There is no objective evidence that the patient is currently responding to internal or external stimuli. She reports feeling "down" for the past two weeks. She states that she's not really depressed and denies symptoms of sadness, hopelessness, worthlessness, crying spells, isolating, guilt, poor sleep or poor appetite. She states that sometimes she takes medications but is unable to recall the name of the medications she was prescribed in the past. She is unable to recall the last time she took her medications. She denies outpatient psychiatry. When asked if she was walking around the house naked, and waking people up, she states, "something like that." She reports fair sleep. However, according to the nursing notes, patient was awake around 3 am. It is unclear if the patient is having difficulty sleeping or if it is related to the increased noise on the unit. Patient denies using illicit drugs or alcohol. She states that she resides with her mother, mother's husband and 3 siblings. 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 I discussed with the patient inpatient psychiatric treatment for mood stabilization and medication management. Patient agreeable to inpatient treatment. Will restart mirtazapine and Risperdal as previously prescribed.   Per patient's mother today, Patient not sleeping for the past 2-3, and not eating for past week. She states that the patient has been solating in room for the past 1.5  week. Patient works but when she gets off would go and stay in her room. 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 Yesterday patient was pacing the street. She's been more depressed than usually for the past 1.5 week. Her car is in the shop and that started the depression. She states that the patient would take medications for a couple of months and then stop (Risperdal and mirtazine). Last took medications in April or May 2024. No drugs or alcohol use. No outpatient psychiatrist. Was going upstairs [facility] outpatient clinic) last in December. Patient is soft spoken, and bubbly at baseline. She noticed her recently flat and going in and out the rooms checking the closets and paranoid. She states that the patient was walking down the hallway in the house yesterday "butt naked" and that has never happened. She states that the patient has never been aggressive. Patient was dx last year. She states that this is new to her and she is learning how to approach the patient's new diagnosis.   Stay Summary: Per chart review, HPI: Sarah Solis 22 y/o female with a history of schizophrenia, presented to [facility] accompanied by her mother [name], [phone]).  Per the patient's mother patient has been acting strange, walking around the house naked going into rooms waking up other family members. According to mom patient has been off her medication for 4 months when asked why the patient off her medicine mom stated that when the patient feels better she would stop taking her medications. According to mom patient was prescribed Risperdal 01 mg during the morning and 3 mg at bedtime. According to mom she gave  the patient medication this morning and this evening before bringing her in. According to mom patient lives at home with her her husband and 3 other siblings.   Per mom patient was hospitalized a year ago inpatient for similar problems. Per mom patient does not drink or smoke and does not use any illicit  drugs. According to mom tonight when they were coming patient stated that she did not want to be here tomorrow meaning she did not want to be alive.  Total Time spent with patient: 30 minutes  Past Psychiatric History: History of schizophreniaform disorder, anxiety, insomnia. Patient hospitalized at [facility] hospital 08/05/22 to 08/14/2022.  Past Medical History: None.  Family History: No known history reported.   Family Psychiatric History: No known history reported. Per patient's mother her father history of schizophrenia.   Social History: Patient resides with her mother, mother's husband and siblings ages 23, 41, 69 years old. 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 She denies drinking alcohol or using illicit drugs.   Tobacco Cessation:  N/A, patient does not currently use tobacco products  Current Medications:  Current Facility-Administered Medications  Medication Dose Route Frequency Provider Last Rate Last Admin   acetaminophen (TYLENOL) tablet 650 mg  650 mg Oral Q6H PRN [name], NP       alum & mag hydroxide-simeth (MAALOX/MYLANTA) 200-200-20 MG/5ML suspension 30 mL  30 mL Oral Q4H PRN [name], NP       magnesium hydroxide (MILK OF MAGNESIA) suspension 30 mL  30 mL Oral Daily PRN [name], NP       mirtazapine (REMERON) tablet 15 mg  15 mg Oral QHS Madox Corkins L, NP       risperiDONE (RISPERDAL) tablet 0.5 mg  0.5 mg Oral BID Kaimani Clayson L, NP   0.5 mg at 09/23/23 0930   ziprasidone (GEODON) injection 20 mg  20 mg Intramuscular PRN [name], NP       Current Outpatient Medications  Medication Sig Dispense Refill   Ergocalciferol (VITAMIN D2) 10 MCG (400 UNIT) TABS Take 800 Units by mouth in the morning. (2 tablets) 60 tablet 2   metFORMIN (GLUCOPHAGE-XR) 500 MG 24 hr tablet Take 1 tablet (500 mg) daily with breakfast for 1 week then increase to 1 tablet (500 mg) twice daily with breakfast and dinner. 60 tablet 2   mirtazapine (REMERON)  15 MG tablet Take 1 tablet (15 mg total) by mouth at bedtime. 30 tablet 2   risperiDONE (RISPERDAL) 0.5 MG tablet Take 1 tablet (0.5 mg total) by mouth in the morning AND 3 tablets (1.5 mg total) at bedtime. 120 tablet 2   traZODone (DESYREL) 50 MG tablet Take 1/2-1 tablet (25-50 mg) nightly as needed for sleep. 30 tablet 2    PTA Medications:  Facility Ordered Medications  Medication   acetaminophen (TYLENOL) tablet 650 mg   alum & mag hydroxide-simeth (MAALOX/MYLANTA) 200-200-20 MG/5ML suspension 30 mL   magnesium hydroxide (MILK OF MAGNESIA) suspension 30 mL   [COMPLETED] LORazepam (ATIVAN) tablet 1 mg   And   ziprasidone (GEODON) injection 20 mg   mirtazapine (REMERON) tablet 15 mg   risperiDONE (RISPERDAL) tablet 0.5 mg   PTA Medications  Medication Sig   traZODone (DESYREL) 50 MG tablet Take 1/2-1 tablet (25-50 mg) nightly as needed for sleep.   Ergocalciferol (VITAMIN D2) 10 MCG (400 UNIT) TABS Take 800 Units by mouth in the morning. (2 tablets)       09/30/2022  8:16 AM 08/31/2022    8:17 AM  Depression screen PHQ 2/9  Decreased Interest 0 2  Down, Depressed, Hopeless 2 2  PHQ - 2 Score 2 4  Altered sleeping 1 3  Tired, decreased energy 1 3  Change in appetite 3 0  Feeling bad or failure about yourself  1 3  Trouble concentrating 0 0  Moving slowly or fidgety/restless 0 0  Suicidal thoughts 0 0  PHQ-9 Score 8 13  Difficult doing work/chores Not difficult at all Somewhat difficult    Flowsheet Row ED from 09/22/2023 in [facility] Counselor from 08/31/2022 in [facility] OP Visit from 08/17/2022 in [facility]  C-SSRS RISK CATEGORY No Risk No Risk No Risk       Musculoskeletal  Strength & Muscle Tone: within normal limits Gait & Station: normal Patient leans: N/A  Psychiatric Specialty Exam  Presentation  General Appearance:  Appropriate for Environment  Eye  Contact: Fair  Speech: Slow; Clear and Coherent  Speech Volume: Decreased  Handedness: Right   Mood and Affect  Mood: Dysphoric  Affect: Congruent   Thought Process  Thought Processes: Linear  Descriptions of Associations:Intact  Orientation:Full (Time, Place and Person)  Thought Content:Logical  Diagnosis of Schizophrenia or Schizoaffective disorder in past: Yes  Duration of Psychotic Symptoms: Greater than six months   Hallucinations:None Ideas of Reference:None  Suicidal Thoughts:Suicidal Thoughts: No  Homicidal Thoughts:Homicidal Thoughts: No   Sensorium  Memory: Immediate Fair  Judgment: Intact  Insight: Present   Executive Functions  Concentration: Fair  Attention Span: Fair  Recall: [employer] of Knowledge: Fair  Language: Fair   Psychomotor Activity  Psychomotor Activity: Psychomotor Activity: Normal   Assets  Assets: Communication Skills; Desire for Improvement; Leisure Time; Physical Health; Social Support   Sleep  Sleep: Sleep: Poor Number of Hours of Sleep: 4   Nutritional Assessment (For OBS and FBC admissions only) Has the patient had a weight loss or gain of 10 pounds or more in the last 3 months?: No Has the patient had a decrease in food intake/or appetite?: No Does the patient have dental problems?: No Does the patient have eating habits or behaviors that may be indicators of an eating disorder including binging or inducing vomiting?: No Has the patient recently lost weight without trying?: 0 Has the patient been eating poorly because of a decreased appetite?: 0 Malnutrition Screening Tool Score: 0    Physical Exam  Physical Exam HENT:     Head: Normocephalic.  Cardiovascular:     Rate and Rhythm: Normal rate.  Pulmonary:     Effort: Pulmonary effort is normal.  Musculoskeletal:        General: Normal range of motion.     Cervical back: Normal range of motion.  Neurological:     Mental  Status: She is alert and oriented to person, place, and time.    Review of Systems  Constitutional: Negative.   HENT: Negative.    Eyes: Negative.   Respiratory: Negative.    Cardiovascular: Negative.   Gastrointestinal: Negative.   Genitourinary: Negative.   Musculoskeletal: Negative.   Neurological: Negative.   Endo/Heme/Allergies: Negative.   Psychiatric/Behavioral:  Positive for depression.    Blood pressure 122/74, pulse (!) 111, temperature 99.5 F (37.5 C), temperature source Oral, resp. rate 18, SpO2 99%. There is no height or weight on file to calculate BMI.   Plan Of Care/Follow-up recommendations:  Recommend starting LAI  inpatient.  Restart risperdal 0.5 mg po BID Restart mirtazepine 15 mg po QHS  Disposition: Patient accepted to [facility] today, accepting is Dr. Sherron Flemings. EMTALA completed. Admission orders placed for [facility]   Enmanuel Zufall L, NP 09/23/2023, 9:54 AM

## 2023-09-23 NOTE — ED Notes (Addendum)
Patient has minimal interactions with staff and peers. Her affect is flat. She is aware that she will discharge today to another [facility] facility. Vol. Consent electronically signed. Patient is calm and cooperative. Patient took her medications with out incident. Will continue to monitor for safety.

## 2023-09-23 NOTE — Progress Notes (Signed)
Pt admitted to [facility] under voluntary status from [facility] where she presented initially accompanied by her mother with complain of "feeling a mess". Pt observed with flat affect, depressed mood, anxious, forgetful with delayed responses, some thought blocking on interactions. However, she denies SI,HI, AVH and pain when assessed. Per pt "I was just feeling like down, very down. I stopped taking my medications like a few months ago. I started feeling like very down within the last 2 weeks" when asked of events leading to admission. States she drinks occasionally but will not remember last episode "I drink whatever is there, I can't remember when last I drank though". Pt denies marijuana or other substance use or abuse. Pt denies all forms of abuse. Reports she lives at home with parents and sibling who are very supportive. States she works at [employer] X 1 year. Pt is cooperative with skin assessment, no areas of skin breakdown observed. Tattoos noted to right arm, belly piercing to abdomen. Pt's belongings searched, brown pair of boots with strings secured in locker. Pt ambulatory to unit with a steady gait. Unit orientation done, routines discussed, care plan reviewed and admission documents signed. Q 15 minutes safety checks initiated without incident. Emotional support, encouragement and reassurance offered. Pt tolerated meals and fluids well. Safety maintained in milieu.

## 2023-09-23 NOTE — Progress Notes (Signed)
Pt has been accepted to [facility] TODAY, 09-23-2023, pending the lab work and EKG.  Bed Assignment: 401-1  Pt meets inpatient criteria per Liborio Nixon, NP  Attending Physician will be Phineas Inches, MD   Report can be called to: -Adult unit: [phone]  Pt can arrive after pending the lab work and EKG; [facility] to coordinate  arrival time  Care Team Notified: [facility] Malva Limes RN, Liborio Nixon NP, Fonnie Jarvis RN   [place], [facility]

## 2023-09-23 NOTE — Tx Team (Signed)
Initial Treatment Plan 09/23/2023 6:45 PM Sarah Solis [id number]    PATIENT STRESSORS: Financial difficulties   Health problems   Medication change or noncompliance     PATIENT STRENGTHS: Ability for insight  Communication skills  Supportive family/friends    PATIENT IDENTIFIED PROBLEMS: "I want to work on my anxiety/depression"  Medication noncompliance  Anxiety  Depression  Psychosis  Auditory hallucination  Ineffective coping skills         DISCHARGE CRITERIA:  Ability to meet basic life and health needs Adequate post-discharge living arrangements Safe-care adequate arrangements made  PRELIMINARY DISCHARGE PLAN: Attend aftercare/continuing care group Outpatient therapy Return to previous living arrangement  PATIENT/FAMILY INVOLVEMENT: This treatment plan has been presented to and reviewed with the patient, Sarah Solis, and/or family member.  The patient and family have been given the opportunity to ask questions and make suggestions.  [name], RN 09/23/2023, 6:45 PM

## 2023-09-23 NOTE — ED Notes (Signed)
Safe Transport called

## 2023-09-23 NOTE — ED Notes (Signed)
Patient unable to use the restroom at this time.

## 2023-09-23 NOTE — ED Notes (Signed)
Pt awake, sitting up at present, quiet & cooperative.  Monitoring for safety.

## 2023-09-24 ENCOUNTER — Encounter (HOSPITAL_COMMUNITY): Payer: Self-pay

## 2023-09-24 DIAGNOSIS — F203 Undifferentiated schizophrenia: Secondary | ICD-10-CM | POA: Diagnosis not present

## 2023-09-24 DIAGNOSIS — I4729 Other ventricular tachycardia: Secondary | ICD-10-CM | POA: Diagnosis present

## 2023-09-24 DIAGNOSIS — E669 Obesity, unspecified: Secondary | ICD-10-CM | POA: Diagnosis present

## 2023-09-24 LAB — URINALYSIS, ROUTINE W REFLEX MICROSCOPIC
Bacteria, UA: NONE SEEN
Bilirubin Urine: NEGATIVE
Glucose, UA: NEGATIVE mg/dL
Hgb urine dipstick: NEGATIVE
Ketones, ur: NEGATIVE mg/dL
Nitrite: NEGATIVE
Protein, ur: 30 mg/dL — AB
Specific Gravity, Urine: 1.03 (ref 1.005–1.030)
pH: 5 (ref 5.0–8.0)

## 2023-09-24 MED ORDER — METFORMIN HCL ER 500 MG PO TB24
500.0000 mg | ORAL_TABLET | Freq: Every day | ORAL | Status: DC
  Administered 2023-09-24 – 2023-10-02 (×9): 500 mg via ORAL
  Filled 2023-09-24 (×11): qty 1

## 2023-09-24 MED ORDER — ARIPIPRAZOLE 10 MG PO TABS
10.0000 mg | ORAL_TABLET | Freq: Every day | ORAL | Status: DC
  Administered 2023-09-24 – 2023-09-26 (×3): 10 mg via ORAL
  Filled 2023-09-24 (×6): qty 1

## 2023-09-24 MED ORDER — PROPRANOLOL HCL 10 MG PO TABS
10.0000 mg | ORAL_TABLET | Freq: Two times a day (BID) | ORAL | Status: DC
  Administered 2023-09-24 – 2023-10-02 (×16): 10 mg via ORAL
  Filled 2023-09-24 (×21): qty 1

## 2023-09-24 NOTE — Progress Notes (Signed)
Due to short staffing, chaplain was unable to see Sarah Solis, but will attempt on Monday. If spiritual or emotional needs arise over the weekend, please page on-call chaplain at [phone].

## 2023-09-24 NOTE — Progress Notes (Signed)
   09/24/23 1000  Psych Admission Type (Psych Patients Only)  Admission Status Voluntary  Psychosocial Assessment  Patient Complaints Anxiety;Depression  Eye Contact Brief  Facial Expression Flat  Affect Depressed;Flat  Speech Slow;Soft  Interaction Forwards little;Minimal  Motor Activity Slow  Appearance/Hygiene Unremarkable  Behavior Characteristics Cooperative  Mood Apprehensive  Thought Process  Coherency WDL  Content WDL  Delusions None reported or observed  Perception WDL  Hallucination None reported or observed  Judgment Limited  Confusion None  Danger to Self  Current suicidal ideation? Denies  Agreement Not to Harm Self Yes  Description of Agreement verbal  Danger to Others  Danger to Others None reported or observed

## 2023-09-24 NOTE — Plan of Care (Signed)
  Problem: Education: Goal: Individualized Educational Video(s) Outcome: Progressing   Problem: Clinical Measurements: Goal: Postoperative complications will be avoided or minimized Outcome: Progressing   Problem: Respiratory: Goal: Will regain and/or maintain adequate ventilation Outcome: Progressing   Problem: Education: Goal: Knowledge of [facility] Education information/materials will improve Outcome: Progressing Goal: Emotional status will improve Outcome: Progressing Goal: Mental status will improve Outcome: Progressing Goal: Verbalization of understanding the information provided will improve Outcome: Progressing   Problem: Activity: Goal: Interest or engagement in activities will improve Outcome: Progressing
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
## 2023-09-24 NOTE — Group Note (Addendum)
Recreation Therapy Group Note   Group Topic:Problem Solving  Group Date: 09/24/2023 Start Time: 0935 End Time: 1007 Facilitators: Calypso Hagarty-McCall, LRT,CTRS Location: [address] Dayroom   Goal Area(s) Addresses:  Patient will effectively work in a team with other group members. Patient will verbalize importance of using appropriate problem solving techniques.  Patient will identify positive change associated with effective problem solving skills.   Group Description: Brain Teasers. Patients were given two sheets of brain teasers. Patients worked together to Radiation protection practitioner.     Education Outcome: Acknowledges understanding/In group clarification offered/Needs additional education.    Affect/Mood: Appropriate   Participation Level: Attentive   Participation Quality: Independent   Behavior: Attentive    Speech/Thought Process: Barely Audible   Insight: Good   Judgement: Good   Modes of Intervention: Group work   Patient Response to Interventions:  Attentive   Education Outcome:  In group clarification offered    Clinical Observations/Individualized Feedback: Pt was quiet and had minimal engagement. Pt was laid back but attentive.     Plan: Continue to engage patient in RT group sessions 2-3x/week.   Shanavia Makela-McCall, LRT,CTRS  09/24/2023 12:36 PM

## 2023-09-24 NOTE — BH IP Treatment Plan (Signed)
Interdisciplinary Treatment and Diagnostic Plan Update  09/24/2023 Time of Session: 11:10AM Sarah Solis MRN: [id number]  Principal Diagnosis: Schizophrenia [facility])  Secondary Diagnoses: Principal Problem:   Schizophrenia (HCC)   Current Medications:  Current Facility-Administered Medications  Medication Dose Route Frequency Provider Last Rate Last Admin   acetaminophen (TYLENOL) tablet 650 mg  650 mg Oral Q6H PRN White, Patrice L, NP       alum & mag hydroxide-simeth (MAALOX/MYLANTA) 200-200-20 MG/5ML suspension 30 mL  30 mL Oral Q4H PRN White, Patrice L, NP       diphenhydrAMINE (BENADRYL) capsule 50 mg  50 mg Oral TID PRN White, Patrice L, NP       Or   diphenhydrAMINE (BENADRYL) injection 50 mg  50 mg Intramuscular TID PRN White, Patrice L, NP       haloperidol (HALDOL) tablet 5 mg  5 mg Oral TID PRN White, Patrice L, NP       Or   haloperidol lactate (HALDOL) injection 5 mg  5 mg Intramuscular TID PRN White, Patrice L, NP       hydrOXYzine (ATARAX) tablet 25 mg  25 mg Oral TID PRN White, Patrice L, NP       influenza vac split trivalent PF (FLULAVAL) injection 0.5 mL  0.5 mL Intramuscular Tomorrow-1000 Massengill, Nathan, MD       LORazepam (ATIVAN) tablet 2 mg  2 mg Oral TID PRN White, Patrice L, NP       Or   LORazepam (ATIVAN) injection 2 mg  2 mg Intramuscular TID PRN White, Patrice L, NP       magnesium hydroxide (MILK OF MAGNESIA) suspension 30 mL  30 mL Oral Daily PRN White, Patrice L, NP       mirtazapine (REMERON) tablet 15 mg  15 mg Oral QHS White, Patrice L, NP   15 mg at 09/23/23 2111   propranolol (INDERAL) tablet 10 mg  10 mg Oral Q12H Massengill, Harrold Donath, MD   10 mg at 09/24/23 4098   risperiDONE (RISPERDAL) tablet 0.5 mg  0.5 mg Oral BID White, Patrice L, NP   0.5 mg at 09/24/23 0750   PTA Medications: Medications Prior to Admission  Medication Sig Dispense Refill Last Dose   Ergocalciferol (VITAMIN D2) 10 MCG (400 UNIT) TABS Take 800 Units by mouth in the  morning. (2 tablets) (Patient not taking: Reported on 09/23/2023) 60 tablet 2    mirtazapine (REMERON) 15 MG tablet Take 1 tablet (15 mg total) by mouth at bedtime. 30 tablet 2    risperiDONE (RISPERDAL) 0.5 MG tablet Take 1 tablet (0.5 mg total) by mouth 2 (two) times daily.       Patient Stressors: Financial difficulties   Health problems   Medication change or noncompliance    Patient Strengths: Ability for insight  Communication skills  Supportive family/friends   Treatment Modalities: Medication Management, Group therapy, Case management,  1 to 1 session with clinician, Psychoeducation, Recreational therapy.   Physician Treatment Plan for Primary Diagnosis: Schizophrenia (HCC) Long Term Goal(s):     Short Term Goals:    Medication Management: Evaluate patient's response, side effects, and tolerance of medication regimen.  Therapeutic Interventions: 1 to 1 sessions, Unit Group sessions and Medication administration.  Evaluation of Outcomes: Not Progressing  Physician Treatment Plan for Secondary Diagnosis: Principal Problem:   Schizophrenia (HCC)  Long Term Goal(s):     Short Term Goals:       Medication Management: Evaluate patient's response, side effects, and tolerance  of medication regimen.  Therapeutic Interventions: 1 to 1 sessions, Unit Group sessions and Medication administration.  Evaluation of Outcomes: Not Progressing   RN Treatment Plan for Primary Diagnosis: Schizophrenia (HCC) Long Term Goal(s): Knowledge of disease and therapeutic regimen to maintain health will improve  Short Term Goals: Ability to remain free from injury will improve, Ability to verbalize frustration and anger appropriately will improve, Ability to demonstrate self-control, Ability to participate in decision making will improve, Ability to verbalize feelings will improve, Ability to disclose and discuss suicidal ideas, Ability to identify and develop effective coping behaviors will  improve, and Compliance with prescribed medications will improve  Medication Management: RN will administer medications as ordered by provider, will assess and evaluate patient's response and provide education to patient for prescribed medication. RN will report any adverse and/or side effects to prescribing provider.  Therapeutic Interventions: 1 on 1 counseling sessions, Psychoeducation, Medication administration, Evaluate responses to treatment, Monitor vital signs and CBGs as ordered, Perform/monitor CIWA, COWS, AIMS and Fall Risk screenings as ordered, Perform wound care treatments as ordered.  Evaluation of Outcomes: Not Progressing   LCSW Treatment Plan for Primary Diagnosis: Schizophrenia (HCC) Long Term Goal(s): Safe transition to appropriate next level of care at discharge, Engage patient in therapeutic group addressing interpersonal concerns.  Short Term Goals: Engage patient in aftercare planning with referrals and resources, Increase social support, Increase ability to appropriately verbalize feelings, Increase emotional regulation, Facilitate acceptance of mental health diagnosis and concerns, Facilitate patient progression through stages of change regarding substance use diagnoses and concerns, Identify triggers associated with mental health/substance abuse issues, and Increase skills for wellness and recovery  Therapeutic Interventions: Assess for all discharge needs, 1 to 1 time with Social worker, Explore available resources and support systems, Assess for adequacy in community support network, Educate family and significant other(s) on suicide prevention, Complete Psychosocial Assessment, Interpersonal group therapy.  Evaluation of Outcomes: Not Progressing   Progress in Treatment: Attending groups: No. Pt just admitted  Participating in groups: No. Taking medication as prescribed: Yes. Toleration medication: Yes. Family/Significant other contact made: No, will contact:   [name] (Mom) [phone] Patient understands diagnosis: Yes. Discussing patient identified problems/goals with staff: Yes. Medical problems stabilized or resolved: Yes. Denies suicidal/homicidal ideation: Yes. Issues/concerns per patient self-inventory: No.   New problem(s) identified: No, Describe:  none reported  New Short Term/Long Term Goal(s): medication stabilization, elimination of SI thoughts, development of comprehensive mental wellness plan.    Patient Goals:  "I want to improve myself and get ready for discharge"  Discharge Plan or Barriers: Patient recently admitted. CSW will continue to follow and assess for appropriate referrals and possible discharge planning.    Reason for Continuation of Hospitalization: Anxiety Medication stabilization Other; describe schizophrenia  Estimated Length of Stay: 5-7 days  Last 3 [place] Suicide Severity Risk Score: Flowsheet Row [facility] (Current[facility] from 09/22/2023 in [facility] Counselor from 08/31/2022 in [facility]  C-SSRS RISK CATEGORY No Risk No Risk No Risk       Last PHQ 2/9 Scores:    09/30/2022    8:16 AM 08/31/2022    8:17 AM  Depression screen PHQ 2/9  Decreased Interest 0 2  Down, Depressed, Hopeless 2 2  PHQ - 2 Score 2 4  Altered sleeping 1 3  Tired, decreased energy 1 3  Change in appetite 3 0  Feeling bad or failure  about yourself  1 3  Trouble concentrating 0 0  Moving slowly or fidgety/restless 0 0  Suicidal thoughts 0 0  PHQ-9 Score 8 13  Difficult doing work/chores Not difficult at all Somewhat difficult    Scribe for Treatment Team: Kathi Der, LCSWA 09/24/2023 11:53 AM

## 2023-09-24 NOTE — Group Note (Unsigned)
Date:  09/24/2023 Time:  1:47 PM  Group Topic/Focus:  Goals Group:   The focus of this group is to help patients establish daily goals to achieve during treatment and discuss how the patient can incorporate goal setting into their daily lives to aide in recovery.     Participation Level:  {BHH PARTICIPATION IRJJO:84166}  Participation Quality:  {BHH PARTICIPATION QUALITY:22265}  Affect:  {BHH AFFECT:22266}  Cognitive:  {BHH COGNITIVE:22267}  Insight: {BHH Insight2:20797}  Engagement in Group:  {BHH ENGAGEMENT IN AYTKZ:60109}  Modes of Intervention:  {BHH MODES OF INTERVENTION:22269}  Additional Comments:  ***  Reymundo Poll 09/24/2023, 1:47 PM

## 2023-09-24 NOTE — BHH Suicide Risk Assessment (Signed)
Suicide Risk Assessment  Admission Assessment    [facility] Admission Suicide Risk Assessment   Nursing information obtained from:  Patient, Review of record Demographic factors:  Adolescent or young adult Current Mental Status:  NA Loss Factors:  Decrease in vocational status Historical Factors:  Impulsivity Risk Reduction Factors:  Positive social support, Living with another person, especially a relative, Sense of responsibility to family, Religious beliefs about death, Employed  Total Time spent with patient: 1.5 hours Principal Problem: Undifferentiated schizophrenia (HCC) Diagnosis:  Principal Problem:   Undifferentiated schizophrenia (HCC) Active Problems:   Insomnia   Anxiety   Paroxysmal ventricular tachycardia (HCC)   Obesity  Subjective Data: Auditory hallucinations & depressive symptoms   Continued Clinical Symptoms:  Alcohol Use Disorder Identification Test Final Score (AUDIT): 2 The "Alcohol Use Disorders Identification Test", Guidelines for Use in Primary Care, Second Edition.  World Science writer [facility]). Score between 0-7:  no or low risk or alcohol related problems. Score between 8-15:  moderate risk of alcohol related problems. Score between 16-19:  high risk of alcohol related problems. Score 20 or above:  warrants further diagnostic evaluation for alcohol dependence and treatment.  CLINICAL FACTORS:   Depression:   Anhedonia Insomnia Severe Schizophrenia:   Paranoid or undifferentiated type Previous Psychiatric Diagnoses and Treatments  Musculoskeletal: Strength & Muscle Tone: within normal limits Gait & Station: normal Patient leans: N/A  Psychiatric Specialty Exam:  Presentation  General Appearance:  Appropriate for Environment; Fairly Groomed  Eye Contact: Fair  Speech: Blocked  Speech Volume: Decreased  Handedness: Right   Mood and Affect  Mood: Anxious; Depressed  Affect: Congruent   Thought Process  Thought  Processes: Coherent  Descriptions of Associations:Intact  Orientation:Partial  Thought Content:Illogical  History of Schizophrenia/Schizoaffective disorder:Yes  Duration of Psychotic Symptoms:Greater than six months  Hallucinations:Hallucinations: Auditory  Ideas of Reference:Paranoia  Suicidal Thoughts:Suicidal Thoughts: No  Homicidal Thoughts:Homicidal Thoughts: No   Sensorium  Memory: Immediate Good  Judgment: Fair  Insight: Fair   Chartered certified accountant: Fair  Attention Span: Fair  Recall: Poor  Fund of Knowledge: Poor  Language: Fair   Lexicographer Activity: Psychomotor Activity: Normal   Assets  Assets: Resilience; Social Support   Sleep  Sleep: Sleep: Poor    Physical Exam: Physical Exam Review of Systems  Constitutional: Negative.   HENT: Negative.    Eyes: Negative.   Respiratory: Negative.    Cardiovascular: Negative.   Gastrointestinal: Negative.   Genitourinary: Negative.   Musculoskeletal: Negative.   Skin: Negative.   Neurological: Negative.   Psychiatric/Behavioral:  Positive for depression and hallucinations. Negative for memory loss, substance abuse and suicidal ideas. The patient is nervous/anxious and has insomnia.    Blood pressure 117/76, pulse (!) 122, temperature 98.4 F (36.9 C), temperature source Oral, resp. rate 20, height 5\' 6"  (1.676 m), weight 92.1 kg, SpO2 100%. Body mass index is 32.77 kg/m.   COGNITIVE FEATURES THAT CONTRIBUTE TO RISK:  None    SUICIDE RISK:   Severe: Denying SI, denies having a plan or intent, but psychosis and events prior to this hospitalization such as wondering the streets & overall impaired mental status render pt at a high risk of suicide.   PLAN OF CARE: See H & P  I certify that inpatient services furnished can reasonably be expected to improve the patient's condition.   Starleen Blue, NP 09/24/2023, 2:53 PM

## 2023-09-24 NOTE — BHH Counselor (Signed)
Adult Comprehensive Assessment  Patient ID: Sarah Solis, female   DOB: [birth year], 22 y.o.   MRN: [id number]  Information Source: Information source: Patient  Current Stressors:  Patient states their primary concerns and needs for treatment are:: 22 y/o female pt presents to [facility] voluntarily accompanied by her mother. Pt  complains of "feeling a mess". Pt displays flat affect, depressed mood, anxious, forgetful with delayed responses, some thought blocking during the interaction. Pt acknowledges audio hallucinations but denies visual hallucinations. Denies SI/HI. Patient states their goals for this hospitilization and ongoing recovery are:: Medication Stabilization Educational / Learning stressors: "poor concentration Employment / Job issues: none reported Family Relationships: pt denied Surveyor, quantity / Lack of resources (include bankruptcy): none reported Housing / Lack of housing: pt denied Physical health (include injuries & life threatening diseases): none reported Social relationships: none reported Substance abuse: pt denied Bereavement / Loss: none reported  Living/Environment/Situation:  Living Arrangements: Parent Who else lives in the home?: Parent and younger siblings What is atmosphere in current home: Comfortable, Supportive, Loving  Family History:  Marital status: Single Are you sexually active?: Yes What is your sexual orientation?: hetersexual Has your sexual activity been affected by drugs, alcohol, medication, or emotional stress?: No Does patient have children?: No  Childhood History:  By whom was/is the patient raised?: Mother, Father Additional childhood history information: Makenly is originally from [place]. [place]. Reports to have been raised in [place]. Akyia describes childhood as " I was the oldest it wasn't bad." Description of patient's relationship with caregiver when they were a child: Mother: good relationship. Father: close Patient's description of current  relationship with people who raised him/her: I'm close to my mom How were you disciplined when you got in trouble as a child/adolescent?: Punishment Did patient suffer any verbal/emotional/physical/sexual abuse as a child?: No Did patient suffer from severe childhood neglect?: No Has patient ever been sexually abused/assaulted/raped as an adolescent or adult?: No Was the patient ever a victim of a crime or a disaster?: No Witnessed domestic violence?: No Has patient been affected by domestic violence as an adult?: No Description of domestic violence: n/a  Education:  Highest grade of school patient has completed: Geneticist, molecular Currently a student?: No Learning disability?: No  Employment/Work Situation:   Employment Situation: Employed 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?: No  Financial Resources:   Financial resources: Income from employment, Medicaid, Support from parents / caregiver Does patient have a representative payee or guardian?: No  Alcohol/Substance Abuse:   What has been your use of drugs/alcohol within the last 12 months?: pt denied If attempted suicide, did drugs/alcohol play a role in this?: No Alcohol/Substance Abuse Treatment Hx: Denies past history Has alcohol/substance abuse ever caused legal problems?: No  Social Support System:   Conservation officer, nature Support System: Good Describe Community Support System: Family and friends Type of faith/religion: DNA How does patient's faith help to cope with current illness?: "I talk to my mom"  Leisure/Recreation:       Strengths/Needs:   What is the patient's perception of their strengths?: DNA Patient states they can use  these personal strengths during their treatment to contribute to their recovery: DNA Patient states these barriers may affect/interfere with their treatment: DNA Patient states these barriers may affect their return to the community: DNA  Discharge Plan:   Currently receiving community mental health services: Yes (From Whom) Patient states concerns and preferences for aftercare planning are: [facility] Patient states they will know when they are safe and ready for discharge when: Once medically stabilized Does patient have access to transportation?: Yes Does patient have financial barriers related to discharge medications?: No Patient description of barriers related to discharge medications: none reported Will patient be returning to same living situation after discharge?: Yes  Summary/Recommendations:   Summary and Recommendations (to be completed by the evaluator): Pt presents to [facility] voluntarily, accompanied by her mother. Pt reports feeling down and "feeling a mess" over the last two weeks. Pt was not able to give much information as she appeared disorganized in thought, difficulty focusing on questions posed and lack of eye contact. It is unclear if pt is responding to internal stimuli. Pt currently denies SI,HI,AVH and substance/alchohol use. Pt currently denies SI/HI. While here, Sarah Solis can benefit from crisis stabilization, medication management, therapeutic milieu, and referrals for services.  Sarah Solis S Alaiza Yau. 09/24/2023

## 2023-09-24 NOTE — Group Note (Signed)
Date:  09/24/2023 Time:  4:37 PM  Group Topic/Focus:  Emotional Education:   The focus of this group is to discuss what feelings/emotions are, and how they are experienced.    Participation Level:  None  Participation Quality:  Inattentive  Affect:  Blunted  Cognitive:  Lacking  Insight:Lacking  Engagement in Group:  None  Modes of Intervention:  Education  Additional Comments:     Reymundo Poll 09/24/2023, 4:37 PM

## 2023-09-24 NOTE — BHH Group Notes (Signed)
Adult Psychoeducational Group Note  Date:  09/24/2023 Time:  10:21 PM  Group Topic/Focus:  Wrap-Up Group:   The focus of this group is to help patients review their daily goal of treatment and discuss progress on daily workbooks.  Participation Level:  Active  Participation Quality:  Appropriate  Affect:  Appropriate  Cognitive:  Appropriate  Insight: Appropriate  Engagement in Group:  Engaged  Modes of Intervention:  Discussion  Additional Comments:  Pt attended and participated in AA group.  Maiya Kates Katrinka Blazing 09/24/2023, 10:21 PM

## 2023-09-24 NOTE — Progress Notes (Signed)
   09/23/23 2023  Psych Admission Type (Psych Patients Only)  Admission Status Voluntary  Psychosocial Assessment  Patient Complaints Depression;Anxiety  Eye Contact Brief  Facial Expression Flat  Affect Depressed  Interaction Forwards little;Minimal  Motor Activity Slow  Appearance/Hygiene Unremarkable  Behavior Characteristics Cooperative  Mood Apprehensive  Thought Process  Coherency WDL  Content WDL  Delusions WDL  Perception WDL  Hallucination None reported or observed  Judgment Limited  Confusion None  Danger to Self  Current suicidal ideation? Denies  Description of Suicide Plan denies  Agreement Not to Harm Self Yes  Description of Agreement verbal  Danger to Others  Danger to Others None reported or observed

## 2023-09-24 NOTE — Group Note (Signed)
Date:  09/24/2023 Time:  1:39 PM  Group Topic/Focus:  Goals Group:   The focus of this group is to help patients establish daily goals to achieve during treatment and discuss how the patient can incorporate goal setting into their daily lives to aide in recovery.    Participation Level:    Participation Quality:  Inattentive  Affect:  Flat  Cognitive:  Disorganized  Insight: none  Engagement in Group:  None  Modes of Intervention:  Discussion  Additional Comments:     Reymundo Poll 09/24/2023, 1:39 PM

## 2023-09-24 NOTE — BHH Suicide Risk Assessment (Signed)
BHH INPATIENT:  Family/Significant Other Suicide Prevention Education  Suicide Prevention Education:  Education Completed; 09-24-2023,  [name] (Mom) [phone] has been identified by the patient as the family member/significant other with whom the patient will be residing, and identified as the person(s) who will aid the patient in the event of a mental health crisis (suicidal ideations/suicide attempt).  With written consent from the patient, the family member/significant other has been provided the following suicide prevention education, prior to the and/or following the discharge of the patient.  The suicide prevention education provided includes the following: Suicide risk factors Suicide prevention and interventions National Suicide Hotline telephone number [facility] assessment telephone number [facility] Emergency Assistance 911 [facility] and/or Residential Mobile Crisis Unit telephone number  Request made of family/significant other to: Remove weapons (e.g., guns, rifles, knives), all items previously/currently identified as safety concern.   Remove drugs/medications (over-the-counter, prescriptions, illicit drugs), all items previously/currently identified as a safety concern.  [name] (Mom) [phone] verbalizes understanding of the suicide prevention education information provided.  The family member/significant other agrees to remove the items of safety concern listed above.  Din Bookwalter S Kyrollos Cordell 09/24/2023, 1:50 PM

## 2023-09-25 DIAGNOSIS — F203 Undifferentiated schizophrenia: Secondary | ICD-10-CM | POA: Diagnosis not present

## 2023-09-25 NOTE — Progress Notes (Signed)
Sarah Solis, is sleeping but arouses easily. She remains depressed , flat and is blocking AV. Denies SI/HI. Taking scheduled medicines and is cooperative.

## 2023-09-25 NOTE — Progress Notes (Signed)
[facility] MD Progress Note  09/25/2023 12:08 PM Schronda Blicharz  MRN:  [id number]  Subjective:   Sarah Solis is a 22 yo AAF with prior mental health diagnosis of Schizophrenia who presented to the  [facility] behavioral health urgent care with complaints of  bizarre behaviors at home as per mother who accompanied her. As per documentation from the [facility]: "Per the patient's mother patient has been acting strange, walking around the house naked going into rooms waking up other family members. According to mom patient has been off her medication for 4 months when asked why the patient off her medicine mom stated that when the patient feels better she would stop taking her medications. According to mom patient was prescribed Risperdal 01 mg during the morning and 3 mg at bedtime." [name], NP, Date of Service: 09/22/2023 10:16 PM).  Pt's last hospitalization at this [facility] was on 08/06/2022. Pt was transferred from the [facility] to this [facility] voluntarily for this hospitalization for treatment and stabilization of her mental status.   Yesterday the psychiatry team made the following recommendations: -Discontinue Risperdal due to concerns for weight gain -Discontinue Remeron due to weight gain -Start Abilify 10 mg daily for psychosis/mood stabilization -Start Metformin 500 mg daily to mitigate weight gain from antipsychotics  My assessment today, the patient reports that auditory hallucinations persist and paranoia also persist unchanged since starting the Abilify.  There is thought blocking.  She appears quite depressed and reports anhedonia.  She reports that sleep and appetite are okay.  Concentration is poor.  Denies any SI or HI.  Denies any side Axis starting Abilify.  Agreeable with dose increase of Abilify.    Principal Problem: Undifferentiated schizophrenia (HCC) Diagnosis: Principal Problem:   Undifferentiated schizophrenia (HCC) Active Problems:   Insomnia   Anxiety   Paroxysmal ventricular  tachycardia (HCC)   Obesity  Total Time spent with patient: 20 minutes  Past Psychiatric History:  Previous Psych Diagnoses: Schizophreniform d/o Prior inpatient treatment: 08/06/2022 (this was first inpatient behavioral health hospitalization) Current/prior outpatient treatment: none Prior rehab WU:[id number]  Psychotherapy YN:[id number]  History of suicide attempts:denies History of homicide or aggression: denies  Psychiatric medication history:Risperdal, Remeron, Trazodone. Psychiatric medication compliance history:non compliance Neuromodulation history: denies Current Psychiatrist:denies having one Current therapist: denies having one    Past Medical History: History reviewed. No pertinent past medical history.  Past Surgical History:  Procedure Laterality Date   INCISION AND DRAINAGE PERIRECTAL ABSCESS Left 10/16/2020   Procedure: IRRIGATION AND DEBRIDEMENT PERIRECTAL ABSCESS;  Surgeon: Abigail Miyamoto, MD;  Location: [facility] OR;  Service: General;  Laterality: Left;   Family History:  Family History  Problem Relation Age of Onset   Schizophrenia Father    Family Psychiatric  History: See H&P  Social History:  Social History   Substance and Sexual Activity  Alcohol Use Not Currently     Social History   Substance and Sexual Activity  Drug Use Not Currently    Social History   Socioeconomic History   Marital status: Single    Spouse name: Not on file   Number of children: Not on file   Years of education: Not on file   Highest education level: Not on file  Occupational History   Not on file  Tobacco Use   Smoking status: Never   Smokeless tobacco: Never  Vaping Use   Vaping status: Some Days  Substance and Sexual Activity   Alcohol use: Not Currently   Drug use: Not Currently   Sexual  activity: Not Currently  Other Topics Concern   Not on file  Social History Narrative   Not currently working but looking to return to work.    Lives with mother, mother's  husband, 2 sisters, and brother.   Social Determinants of Health   Financial Resource Strain: Medium Risk (08/31/2022)   Overall Financial Resource Strain (CARDIA)    Difficulty of Paying Living Expenses: Somewhat hard  Food Insecurity: No Food Insecurity (09/23/2023)   Hunger Vital Sign    Worried About Running Out of Food in the Last Year: Never true    Ran Out of Food in the Last Year: Never true  Transportation Needs: Unmet Transportation Needs (09/23/2023)   PRAPARE - Administrator, Civil Service (Medical): Yes    Lack of Transportation (Non-Medical): Yes  Physical Activity: Inactive (08/31/2022)   Exercise Vital Sign    Days of Exercise per Week: 0 days    Minutes of Exercise per Session: 0 min  Stress: Stress Concern Present (08/31/2022)   [employer] of Occupational Health - Occupational Stress Questionnaire    Feeling of Stress : To some extent  Social Connections: Socially Isolated (08/31/2022)   Social Connection and Isolation Panel [NHANES]    Frequency of Communication with Friends and Family: Never    Frequency of Social Gatherings with Friends and Family: Never    Attends Religious Services: 1 to 4 times per year    Active Member of [employer] or Organizations: No    Attends Engineer, structural: Never    Marital Status: Never married   Additional Social History:                           Current Medications: Current Facility-Administered Medications  Medication Dose Route Frequency Provider Last Rate Last Admin   acetaminophen (TYLENOL) tablet 650 mg  650 mg Oral Q6H PRN White, Patrice L, NP       alum & mag hydroxide-simeth (MAALOX/MYLANTA) 200-200-20 MG/5ML suspension 30 mL  30 mL Oral Q4H PRN White, Patrice L, NP       ARIPiprazole (ABILIFY) tablet 10 mg  10 mg Oral Daily Starleen Blue, NP   10 mg at 09/25/23 0840   diphenhydrAMINE (BENADRYL) capsule 50 mg  50 mg Oral TID PRN White, Patrice L, NP       Or   diphenhydrAMINE  (BENADRYL) injection 50 mg  50 mg Intramuscular TID PRN White, Patrice L, NP       haloperidol (HALDOL) tablet 5 mg  5 mg Oral TID PRN White, Patrice L, NP       Or   haloperidol lactate (HALDOL) injection 5 mg  5 mg Intramuscular TID PRN White, Patrice L, NP       hydrOXYzine (ATARAX) tablet 25 mg  25 mg Oral TID PRN White, Patrice L, NP       influenza vac split trivalent PF (FLULAVAL) injection 0.5 mL  0.5 mL Intramuscular Tomorrow-1000 Jesiel Garate, MD       LORazepam (ATIVAN) tablet 2 mg  2 mg Oral TID PRN White, Patrice L, NP       Or   LORazepam (ATIVAN) injection 2 mg  2 mg Intramuscular TID PRN White, Patrice L, NP       magnesium hydroxide (MILK OF MAGNESIA) suspension 30 mL  30 mL Oral Daily PRN White, Patrice L, NP       metFORMIN (GLUCOPHAGE-XR) 24 hr tablet 500 mg  500 mg Oral Q breakfast Starleen Blue, NP   500 mg at 09/25/23 0858   propranolol (INDERAL) tablet 10 mg  10 mg Oral Q12H Brittay Mogle, Harrold Donath, MD   10 mg at 09/25/23 9518    Lab Results:  Results for orders placed or performed during the hospital encounter of 09/23/23 (from the past 48 hour(s))  Urinalysis, Routine w reflex microscopic -Urine, Clean Catch     Status: Abnormal   Collection Time: 09/24/23  7:29 PM  Result Value Ref Range   Color, Urine YELLOW (A) YELLOW   APPearance TURBID (A) CLEAR   Specific Gravity, Urine 1.030 1.005 - 1.030   pH 5.0 5.0 - 8.0   Glucose, UA NEGATIVE NEGATIVE mg/dL   Hgb urine dipstick NEGATIVE NEGATIVE   Bilirubin Urine NEGATIVE NEGATIVE   Ketones, ur NEGATIVE NEGATIVE mg/dL   Protein, ur 30 (A) NEGATIVE mg/dL   Nitrite NEGATIVE NEGATIVE   Leukocytes,Ua TRACE (A) NEGATIVE   RBC / HPF 0-5 0 - 5 RBC/hpf   WBC, UA 0-5 0 - 5 WBC/hpf   Bacteria, UA NONE SEEN NONE SEEN   Squamous Epithelial / HPF 0-5 0 - 5 /HPF   Mucus PRESENT    Amorphous Crystal PRESENT     Comment: Performed at [facility], [address], [place], [place] [address]    Blood  Alcohol level:  Lab Results  Component Value Date   ETH <10 09/23/2023   ETH <10 08/17/2022    Metabolic Disorder Labs: Lab Results  Component Value Date   HGBA1C 5.6 09/23/2023   MPG 114.02 09/23/2023   MPG 99.67 08/04/2022   No results found for: "PROLACTIN" Lab Results  Component Value Date   CHOL 117 09/23/2023   TRIG 45 09/23/2023   HDL 34 (L) 09/23/2023   CHOLHDL 3.4 09/23/2023   VLDL 9 09/23/2023   LDLCALC 74 09/23/2023   LDLCALC 103 (H) 08/04/2022    Physical Findings: AIMS:  , ,  ,  ,    CIWA:    COWS:       Psychiatric Specialty Exam:  Presentation  General Appearance:  Appropriate for Environment; Fairly Groomed  Eye Contact: Fair  Speech: Blocked  Speech Volume: Decreased  Handedness: Right   Mood and Affect  Mood: Anxious; Depressed  Affect: Congruent   Thought Process  Thought Processes: Coherent  Descriptions of Associations:Intact  Orientation:Partial  Thought Content:Illogical  History of Schizophrenia/Schizoaffective disorder:Yes  Duration of Psychotic Symptoms:Greater than six months  Hallucinations:Hallucinations: Auditory  Ideas of Reference:Paranoia  Suicidal Thoughts:Suicidal Thoughts: No  Homicidal Thoughts:Homicidal Thoughts: No   Sensorium  Memory: Immediate Good  Judgment: Fair  Insight: Fair   Chartered certified accountant: Fair  Attention Span: Fair  Recall: Poor  Fund of Knowledge: Poor  Language: Fair   Lexicographer Activity: Psychomotor Activity: Normal   Assets  Assets: Resilience; Social Support   Sleep  Sleep: Sleep: Poor    Physical Exam: Physical Exam Vitals reviewed.  Constitutional:      General: She is not in acute distress.    Appearance: She is not toxic-appearing.  Pulmonary:     Effort: Pulmonary effort is normal.  Neurological:     Mental Status: She is alert.     Motor: No weakness.     Gait: Gait normal.   Psychiatric:        Behavior: Behavior normal.    Review of Systems  Constitutional:  Negative for chills and fever.  Cardiovascular:  Negative for chest pain and palpitations.  Neurological:  Negative for dizziness, tingling, tremors and headaches.  Psychiatric/Behavioral:  Positive for depression and hallucinations. Negative for memory loss, substance abuse and suicidal ideas. The patient is nervous/anxious. The patient does not have insomnia.   All other systems reviewed and are negative.  Blood pressure (!) 107/57, pulse (!) 104, temperature 97.8 F (36.6 C), temperature source Oral, resp. rate 18, height 5\' 6"  (1.676 m), weight 92.1 kg, SpO2 100%. Body mass index is 32.77 kg/m.   Treatment Plan Summary: Daily contact with patient to assess and evaluate symptoms and progress in treatment and Medication management  ASSESSMENT:  Diagnoses / Active Problems: Schizophrenia  PLAN: Safety and Monitoring:  --  Voluntary admission to inpatient psychiatric unit for safety, stabilization and treatment  -- Daily contact with patient to assess and evaluate symptoms and progress in treatment  -- Patient's case to be discussed in multi-disciplinary team meeting  -- Observation Level : q15 minute checks  -- Vital signs:  q12 hours  -- Precautions: suicide, elopement, and assault  2. Psychiatric Diagnoses and Treatment:     -Increase Abilify from 10 mg top 15 mg once daily for psychosis/mood stabilization -Continue metformin 500 mg daily to mitigate weight gain from antipsychotics  -Continue Inderal 10 mg BID for anxiety/tachycardia  -Continue Hydroxyzine 25 mg TID PRN for anxiety -Continue Agitation protocol medication: Benadryl/Haldol/Ativan PRN   -Previously discontinued on admission Risperdal due to concerns for weight gain -Previously discontinued on admission Remeron due to weight gain  --  The risks/benefits/side-effects/alternatives to this medication were discussed in  detail with the patient and time was given for questions. The patient consents to medication trial.    -- Metabolic profile and EKG monitoring obtained while on an atypical antipsychotic (BMI: Lipid Panel: HbgA1c: QTc:)   -- Encouraged patient to participate in unit milieu and in scheduled group therapies      3. Medical Issues Being Addressed:      4. Discharge Planning:   -- Social work and case management to assist with discharge planning and identification of hospital follow-up needs prior to discharge  -- Estimated LOS: 5-7 days  -- Discharge Concerns: Need to establish a safety plan; Medication compliance and effectiveness  -- Discharge Goals: Return home with outpatient referrals for mental health follow-up including medication management/psychotherapy   Phineas Inches, MD 09/25/2023, 12:08 PM  Total Time Spent in Direct Patient Care:  I personally spent 35 minutes on the unit in direct patient care. The direct patient care time included face-to-face time with the patient, reviewing the patient's chart, communicating with other professionals, and coordinating care. Greater than 50% of this time was spent in counseling or coordinating care with the patient regarding goals of hospitalization, psycho-education, and discharge planning needs.   Phineas Inches, MD Psychiatrist

## 2023-09-25 NOTE — [facility] Group Notes (Signed)
[facility] Group Notes:  (Nursing/MHT/Case Management/Adjunct)  Date:  09/25/2023  Time:  2:25 PM  Type of Therapy:  Psychoeducational Skills  Participation Level:  Active  Participation Quality:  Appropriate  Affect:  Appropriate  Cognitive:  Alert and Appropriate  Insight:  Appropriate  Engagement in Group:  Engaged  Modes of Intervention:  Discussion, Education, and Exploration  Summary of Progress/Problems:  Patients were educated on positive reframing, and the impacts it can have on mental well being. Pts were then given a podcast to listen to by '' On Purpose with Berniece Pap '' to identify healthy behaviors and habits to impact mental health. Pt attended and was appropriate.   Sarah Solis 09/25/2023, 2:25 PM

## 2023-09-25 NOTE — Plan of Care (Signed)
°  Problem: Education: °Goal: Knowledge of [facility] Education information/materials will improve °Outcome: Not Progressing °Goal: Emotional status will improve °Outcome: Not Progressing °Goal: Mental status will improve °Outcome: Not Progressing °Goal: Verbalization of understanding the information provided will improve °Outcome: Not Progressing °  °

## 2023-09-25 NOTE — Group Note (Unsigned)
Date:  09/25/2023 Time:  2:35 PM  Group Topic/Focus:  Goals Group:   The focus of this group is to help patients establish daily goals to achieve during treatment and discuss how the patient can incorporate goal setting into their daily lives to aide in recovery.     Participation Level:  {B[facility] PARTICIPATION XLKGM:01027}  Participation Quality:  {B[facility] PARTICIPATION QUALITY:22265}  Affect:  {B[facility] AFFECT:22266}  Cognitive:  {[facility] COGNITIVE:22267}  Insight: {BHH Insight2:20797}  Engagement in Group:  {B[facility] ENGAGEMENT IN OZDGU:44034}  Modes of Intervention:  {BHH MODES OF INTERVENTION:22269}  Additional Comments:  ***  Reymundo Poll 09/25/2023, 2:35 PM

## 2023-09-25 NOTE — [facility] Group Notes (Signed)
[facility] Group Notes:  (Nursing/MHT/Case Management/Adjunct)  Date:  09/25/2023  Time:  9:53 PM  Type of Therapy:   Wrap-up group  Participation Level:  Active  Participation Quality:  Appropriate  Affect:  Appropriate  Cognitive:  Appropriate  Insight:  Appropriate  Engagement in Group:  Engaged  Modes of Intervention:  Education  Summary of Progress/Problems: Pt goal to work on Pharmacologist. Rated her day 8/10.   Noah Delaine 09/25/2023, 9:53 PM

## 2023-09-25 NOTE — Progress Notes (Signed)
   09/25/23 2111  Psych Admission Type (Psych Patients Only)  Admission Status Voluntary  Psychosocial Assessment  Patient Complaints Depression;Sadness  Eye Contact Brief  Facial Expression Flat  Affect Flat  Speech Soft;Slow  Interaction Guarded;Minimal  Appearance/Hygiene Unremarkable  Behavior Characteristics Appropriate to situation  Mood Apprehensive  Thought Process  Coherency WDL  Content WDL  Delusions None reported or observed  Perception WDL  Hallucination None reported or observed  Judgment Limited  Confusion None  Danger to Self  Current suicidal ideation? Denies  Agreement Not to Harm Self Yes  Description of Agreement verbal  Danger to Others  Danger to Others None reported or observed

## 2023-09-25 NOTE — Progress Notes (Signed)
   09/24/23 2013  Psych Admission Type (Psych Patients Only)  Admission Status Voluntary  Psychosocial Assessment  Patient Complaints Anxiety;Depression  Eye Contact Brief  Facial Expression Flat  Affect Flat  Speech Slow;Soft  Interaction Guarded  Motor Activity Slow  Appearance/Hygiene Unremarkable  Behavior Characteristics Appropriate to situation  Mood Apprehensive  Thought Process  Coherency WDL  Content WDL  Delusions None reported or observed  Perception WDL  Hallucination None reported or observed  Judgment Limited  Confusion None  Danger to Self  Current suicidal ideation? Denies  Agreement Not to Harm Self Yes  Description of Agreement verbal  Danger to Others  Danger to Others None reported or observed

## 2023-09-26 DIAGNOSIS — F203 Undifferentiated schizophrenia: Secondary | ICD-10-CM | POA: Diagnosis not present

## 2023-09-26 MED ORDER — ARIPIPRAZOLE 15 MG PO TABS
15.0000 mg | ORAL_TABLET | Freq: Every day | ORAL | Status: DC
  Filled 2023-09-26 (×2): qty 1

## 2023-09-26 MED ORDER — ARIPIPRAZOLE 5 MG PO TABS
5.0000 mg | ORAL_TABLET | ORAL | Status: AC
  Administered 2023-09-26: 5 mg via ORAL
  Filled 2023-09-26 (×2): qty 1

## 2023-09-26 NOTE — Group Note (Signed)
[facility] LCSW Group Therapy Note  09/26/2023  10:00-11:00AM  Type of Therapy and Topic:  Group Therapy:  Building Supports  Participation Level:  Active   Description of Group:  Patients in this group were introduced to the idea of adding a variety of healthy supports to address the various needs in their lives.  Different types of support were defined and described, and each type was acted out.  Patients discussed what additional healthy supports could be helpful in their recovery and wellness after discharge in order to prevent future hospitalizations.   An emphasis was placed on following up with the discharge plan when they leave the hospital in order to continue becoming healthier and happier.  Two songs were played during group to help further patients' understanding.  Therapeutic Goals: 1)  demonstrate the importance of adding supports  2)  discuss 4 definitions of support  3)  identify the patient's current level of healthy support and   4)  elicit commitments to add one healthy support   Summary of Patient Progress:  The patient expressed good comprehension of the concepts presented, and appeared to agree that there is a need to add more supports.  Current supports include family and friends.  The patient indicated one healthy support that could be helpful if added is hard for her to come up with.  She participated by listening and did not seem to be tracking the whole discussion.  Therapeutic Modalities:   Psychoeducation Brief Solution-Focused Therapy  Lynnell Chad, LCSW 09/26/2023 11:53 AM

## 2023-09-26 NOTE — [facility] Group Notes (Signed)
[facility] Group Notes:  (Nursing/MHT/Case Management/Adjunct)  Date:  09/26/2023  Time:  9:16 PM  Type of Therapy:   Wrap-up group  Participation Level:  Active  Participation Quality:  Appropriate  Affect:  Appropriate  Cognitive:  Appropriate  Insight:  Appropriate  Engagement in Group:  Engaged  Modes of Intervention:  Education  Summary of Progress/Problems: Pt goal to get to know people. Pt reports she met her goal. Rated her day 7/10.  Noah Delaine 09/26/2023, 9:16 PM

## 2023-09-26 NOTE — Progress Notes (Signed)
Patient refused to get her labs drawn this evening.

## 2023-09-26 NOTE — Progress Notes (Signed)
Patient refused labs

## 2023-09-26 NOTE — Progress Notes (Signed)
   09/26/23 1926  Psych Admission Type (Psych Patients Only)  Admission Status Voluntary  Psychosocial Assessment  Patient Complaints Anxiety;Depression  Eye Contact Brief  Facial Expression Flat  Affect Flat  Speech Soft;Slow  Interaction Guarded  Motor Activity Slow  Appearance/Hygiene Unremarkable  Behavior Characteristics Appropriate to situation  Mood Depressed  Thought Process  Coherency Loose associations  Content Blaming others  Delusions Paranoid  Perception Hallucinations  Hallucination Auditory  Judgment Limited  Confusion None  Danger to Self  Current suicidal ideation? Denies  Agreement Not to Harm Self Yes  Description of Agreement verbal  Danger to Others  Danger to Others None reported or observed

## 2023-09-26 NOTE — Progress Notes (Signed)
[facility] MD Progress Note  09/26/2023 2:24 PM Sovanna Luquette  MRN:  [id number]  Subjective:   Nolene Stahl is a 22 yo AAF with prior mental health diagnosis of Schizophrenia who presented to the [facility] behavioral health urgent care with complaints of  bizarre behaviors at home as per mother who accompanied her. As per documentation from the [facility]: "Per the patient's mother patient has been acting strange, walking around the house naked going into rooms waking up other family members. According to mom patient has been off her medication for 4 months when asked why the patient off her medicine mom stated that when the patient feels better she would stop taking her medications. According to mom patient was prescribed Risperdal 01 mg during the morning and 3 mg at bedtime." [name], NP, Date of Service: 09/22/2023 10:16 PM).  Pt's last hospitalization at this [facility] was on 08/06/2022. Pt was transferred from the [facility] [place] [facility] to this [facility] voluntarily for this hospitalization for treatment and stabilization of her mental status.   24 hr chart review: As per nursing documentation, pt has remained guarded, speaks minimally, but has been appropriate in the milieu and attending unit group sessions, is med compliant,  & V/S are WNL.  Patient assessment note: During today's assessment, pt remains guarded, but to a lesser extent as compared to at time of admission. She remains suspicious, also to a lesser extent. She rates depressions as 4, 10 being worst, but there are concerns that she is under reporting her symptoms, in hopes of wanting discharge. As per objective assessment, pt remains very depressed, and appears with a significantly depressed mood and affect is congruent. She denies SI, denies HI/AVH today, but is still very paranoid. She denies first rank symptoms, denies medication related side effects, denies being in any physical pain today. Reports last BM as being yesterday. Pt reports that sleep is fair,  and that it is easy to ofall asleep, but that she is not staying asleep through the night.  Education on Abilify reiterated, and pt continues to be agreeable to this medication with the option of a LAI prior to discharge.  Abilify was increased from 10 to 15 mg daily starting today for psychosis and mood stabilization. Will continue other meds as listed below and will continue to monitor. No TD/EPS type symptoms found on assessment, and pt denies any feelings of stiffness. AIMS: 0.   Principal Problem: Undifferentiated schizophrenia (HCC) Diagnosis: Principal Problem:   Undifferentiated schizophrenia (HCC) Active Problems:   Insomnia   Anxiety   Paroxysmal ventricular tachycardia (HCC)   Obesity  Total Time spent with patient: 20 minutes  Past Psychiatric History:  Previous Psych Diagnoses: Schizophreniform d/o Prior inpatient treatment: 08/06/2022 (this was first inpatient behavioral health hospitalization) Current/prior outpatient treatment: none Prior rehab WU:[id number]  Psychotherapy YN:[id number]  History of suicide attempts:denies History of homicide or aggression: denies  Psychiatric medication history:Risperdal, Remeron, Trazodone. Psychiatric medication compliance history:non compliance Neuromodulation history: denies Current Psychiatrist:denies having one Current therapist: denies having one    Past Medical History: History reviewed. No pertinent past medical history.  Past Surgical History:  Procedure Laterality Date   INCISION AND DRAINAGE PERIRECTAL ABSCESS Left 10/16/2020   Procedure: IRRIGATION AND DEBRIDEMENT PERIRECTAL ABSCESS;  Surgeon: Abigail Miyamoto, MD;  Location: [facility] OR;  Service: General;  Laterality: Left;   Family History:  Family History  Problem Relation Age of Onset   Schizophrenia Father    Family Psychiatric  History: See H&P  Social History:  Social  History   Substance and Sexual Activity  Alcohol Use Not Currently     Social History    Substance and Sexual Activity  Drug Use Not Currently    Social History   Socioeconomic History   Marital status: Single    Spouse name: Not on file   Number of children: Not on file   Years of education: Not on file   Highest education level: Not on file  Occupational History   Not on file  Tobacco Use   Smoking status: Never   Smokeless tobacco: Never  Vaping Use   Vaping status: Some Days  Substance and Sexual Activity   Alcohol use: Not Currently   Drug use: Not Currently   Sexual activity: Not Currently  Other Topics Concern   Not on file  Social History Narrative   Not currently working but looking to return to work.    Lives with mother, mother's husband, 2 sisters, and brother.   Social Determinants of Health   Financial Resource Strain: Medium Risk (08/31/2022)   Overall Financial Resource Strain (CARDIA)    Difficulty of Paying Living Expenses: Somewhat hard  Food Insecurity: No Food Insecurity (09/23/2023)   Hunger Vital Sign    Worried About Running Out of Food in the Last Year: Never true    Ran Out of Food in the Last Year: Never true  Transportation Needs: Unmet Transportation Needs (09/23/2023)   PRAPARE - Administrator, Civil Service (Medical): Yes    Lack of Transportation (Non-Medical): Yes  Physical Activity: Inactive (08/31/2022)   Exercise Vital Sign    Days of Exercise per Week: 0 days    Minutes of Exercise per Session: 0 min  Stress: Stress Concern Present (08/31/2022)   [employer] of Occupational Health - Occupational Stress Questionnaire    Feeling of Stress : To some extent  Social Connections: Socially Isolated (08/31/2022)   Social Connection and Isolation Panel [NHANES]    Frequency of Communication with Friends and Family: Never    Frequency of Social Gatherings with Friends and Family: Never    Attends Religious Services: 1 to 4 times per year    Active Member of [employer] or Organizations: No    Attends Museum/gallery exhibitions officer: Never    Marital Status: Never married   Additional Social History:                           Current Medications: Current Facility-Administered Medications  Medication Dose Route Frequency Provider Last Rate Last Admin   acetaminophen (TYLENOL) tablet 650 mg  650 mg Oral Q6H PRN White, Patrice L, NP       alum & mag hydroxide-simeth (MAALOX/MYLANTA) 200-200-20 MG/5ML suspension 30 mL  30 mL Oral Q4H PRN White, Patrice L, NP       [START ON 09/27/2023] ARIPiprazole (ABILIFY) tablet 15 mg  15 mg Oral Daily Massengill, Nathan, MD       diphenhydrAMINE (BENADRYL) capsule 50 mg  50 mg Oral TID PRN White, Patrice L, NP       Or   diphenhydrAMINE (BENADRYL) injection 50 mg  50 mg Intramuscular TID PRN White, Patrice L, NP       haloperidol (HALDOL) tablet 5 mg  5 mg Oral TID PRN White, Patrice L, NP       Or   haloperidol lactate (HALDOL) injection 5 mg  5 mg Intramuscular TID PRN Layla Barter, NP  hydrOXYzine (ATARAX) tablet 25 mg  25 mg Oral TID PRN White, Patrice L, NP       influenza vac split trivalent PF (FLULAVAL) injection 0.5 mL  0.5 mL Intramuscular Tomorrow-1000 Massengill, Harrold Donath, MD       LORazepam (ATIVAN) tablet 2 mg  2 mg Oral TID PRN White, Patrice L, NP       Or   LORazepam (ATIVAN) injection 2 mg  2 mg Intramuscular TID PRN White, Patrice L, NP       magnesium hydroxide (MILK OF MAGNESIA) suspension 30 mL  30 mL Oral Daily PRN White, Patrice L, NP       metFORMIN (GLUCOPHAGE-XR) 24 hr tablet 500 mg  500 mg Oral Q breakfast Starleen Blue, NP   500 mg at 09/26/23 0943   propranolol (INDERAL) tablet 10 mg  10 mg Oral Q12H Massengill, Harrold Donath, MD   10 mg at 09/26/23 7829    Lab Results:  Results for orders placed or performed during the hospital encounter of 09/23/23 (from the past 48 hour(s))  Urinalysis, Routine w reflex microscopic -Urine, Clean Catch     Status: Abnormal   Collection Time: 09/24/23  7:29 PM  Result Value Ref  Range   Color, Urine YELLOW (A) YELLOW   APPearance TURBID (A) CLEAR   Specific Gravity, Urine 1.030 1.005 - 1.030   pH 5.0 5.0 - 8.0   Glucose, UA NEGATIVE NEGATIVE mg/dL   Hgb urine dipstick NEGATIVE NEGATIVE   Bilirubin Urine NEGATIVE NEGATIVE   Ketones, ur NEGATIVE NEGATIVE mg/dL   Protein, ur 30 (A) NEGATIVE mg/dL   Nitrite NEGATIVE NEGATIVE   Leukocytes,Ua TRACE (A) NEGATIVE   RBC / HPF 0-5 0 - 5 RBC/hpf   WBC, UA 0-5 0 - 5 WBC/hpf   Bacteria, UA NONE SEEN NONE SEEN   Squamous Epithelial / HPF 0-5 0 - 5 /HPF   Mucus PRESENT    Amorphous Crystal PRESENT     Comment: Performed at [facility], [address] [place], [place] [address]    Blood Alcohol level:  Lab Results  Component Value Date   ETH <10 09/23/2023   ETH <10 08/17/2022    Metabolic Disorder Labs: Lab Results  Component Value Date   HGBA1C 5.6 09/23/2023   MPG 114.02 09/23/2023   MPG 99.67 08/04/2022   No results found for: "PROLACTIN" Lab Results  Component Value Date   CHOL 117 09/23/2023   TRIG 45 09/23/2023   HDL 34 (L) 09/23/2023   CHOLHDL 3.4 09/23/2023   VLDL 9 09/23/2023   LDLCALC 74 09/23/2023   LDLCALC 103 (H) 08/04/2022    Physical Findings: AIMS:  , ,  ,  ,    CIWA:    COWS:       Psychiatric Specialty Exam:  Presentation  General Appearance:  Appropriate for Environment; Fairly Groomed  Eye Contact: Fair  Speech: Clear and Coherent  Speech Volume: Decreased  Handedness: Right   Mood and Affect  Mood: Depressed  Affect: Congruent   Thought Process  Thought Processes: Other (comment) (blocking)  Descriptions of Associations:Intact  Orientation:Partial  Thought Content:Logical  History of Schizophrenia/Schizoaffective disorder:Yes  Duration of Psychotic Symptoms:Greater than six months  Hallucinations:Hallucinations: None   Ideas of Reference:Paranoia  Suicidal Thoughts:Suicidal Thoughts: No   Homicidal  Thoughts:Homicidal Thoughts: No    Sensorium  Memory: Immediate Fair; Recent Poor; Remote Poor  Judgment: Fair  Insight: Fair   Chartered certified accountant: Fair  Attention Span: Fair  Recall: Jennelle Human of Knowledge: Fair  Language: Fair   Psychomotor Activity  Psychomotor Activity: No data recorded   Assets  Assets: Resilience   Sleep  Sleep: Sleep: Fair     Physical Exam: Physical Exam Vitals reviewed.  Constitutional:      General: She is not in acute distress.    Appearance: She is not toxic-appearing.  Pulmonary:     Effort: Pulmonary effort is normal.  Neurological:     Mental Status: She is alert.     Motor: No weakness.     Gait: Gait normal.  Psychiatric:        Behavior: Behavior normal.    Review of Systems  Constitutional:  Negative for chills and fever.  Cardiovascular:  Negative for chest pain and palpitations.  Neurological:  Negative for dizziness, tingling, tremors and headaches.  Psychiatric/Behavioral:  Positive for depression and hallucinations. Negative for memory loss, substance abuse and suicidal ideas. The patient is nervous/anxious. The patient does not have insomnia.   All other systems reviewed and are negative.  Blood pressure 121/75, pulse 98, temperature 98.7 F (37.1 C), temperature source Oral, resp. rate 18, height 5\' 6"  (1.676 m), weight 92.1 kg, SpO2 100%. Body mass index is 32.77 kg/m.   Treatment Plan Summary: Daily contact with patient to assess and evaluate symptoms and progress in treatment and Medication management  ASSESSMENT:  Diagnoses / Active Problems: Schizophrenia  PLAN: Safety and Monitoring:  --  Voluntary admission to inpatient psychiatric unit for safety, stabilization and treatment  -- Daily contact with patient to assess and evaluate symptoms and progress in treatment  -- Patient's case to be discussed in multi-disciplinary team meeting  -- Observation Level : q15  minute checks  -- Vital signs:  q12 hours  -- Precautions: suicide, elopement, and assault  2. Psychiatric Diagnoses and Treatment:     -Increase Abilify from 10 mg to 15 mg once daily for psychosis/mood stabilization -Continue metformin 500 mg daily to mitigate weight gain from antipsychotics  -Continue Inderal 10 mg BID for anxiety/tachycardia  -Continue Hydroxyzine 25 mg TID PRN for anxiety -Continue Agitation protocol medication: Benadryl/Haldol/Ativan PRN   -Previously discontinued on admission Risperdal due to concerns for weight gain -Previously discontinued on admission Remeron due to weight gain  --  The risks/benefits/side-effects/alternatives to this medication were discussed in detail with the patient and time was given for questions. The patient consents to medication trial.    -- Metabolic profile and EKG monitoring obtained while on an atypical antipsychotic (BMI: Lipid Panel: HbgA1c: QTc:)   -- Encouraged patient to participate in unit milieu and in scheduled group therapies      3. Medical Issues Being Addressed:  Labs Pending: Vitamin D, CBC with diff  4. Discharge Planning:   -- Social work and case management to assist with discharge planning and identification of hospital follow-up needs prior to discharge  -- Estimated LOS: 5-7 days  -- Discharge Concerns: Need to establish a safety plan; Medication compliance and effectiveness  -- Discharge Goals: Return home with outpatient referrals for mental health follow-up including medication management/psychotherapy   Starleen Blue, NP 09/26/2023, 2:24 PM  Total Time Spent in Direct Patient Care:  I personally spent 45 minutes on the unit in direct patient care. The direct patient care time included face-to-face time with the patient, reviewing the patient's chart, communicating with other professionals, and coordinating care. Greater than 50% of this time was spent in counseling or coordinating care with the  patient regarding goals of hospitalization, psycho-education, and discharge planning needs.   Patient ID: Raizel Perna, female   DOB: 2001/03/11, 22 y.o.   MRN: [id number]

## 2023-09-26 NOTE — [facility] Group Notes (Signed)
[facility] Group Notes:  (Nursing/MHT/Case Management/Adjunct)  Date:  09/26/2023  Time:  9:07 AM  Type of Therapy:   Goals group  Participation Level:  Active  Participation Quality:  Resistant  Affect:  Depressed  Cognitive:  Lacking  Insight:  Limited  Engagement in Group:  Lacking  Modes of Intervention:  Discussion and Orientation  Summary of Progress/Problems:  Azalee Course 09/26/2023, 9:07 AM

## 2023-09-26 NOTE — [facility] Group Notes (Signed)
[facility] Group Notes:  (Nursing/MHT/Case Management/Adjunct)  Date:  09/26/2023  Time:  3:18 PM  Type of Therapy:  Psychoeducational Skills  Participation Level:  Did Not Attend  Participation Quality:  na  Affect:  na  Cognitive:  na  Insight:  None  Engagement in Group:  na  Modes of Intervention:  na  Summary of Progress/Problems: pt did not attend RN group  Malva Limes 09/26/2023, 3:18 PM

## 2023-09-26 NOTE — Plan of Care (Signed)
  Problem: Education: Goal: Emotional status will improve Outcome: Progressing Goal: Mental status will improve Outcome: Progressing   

## 2023-09-26 NOTE — Progress Notes (Signed)
D: Patient is alert, oriented, and cooperative. Denies SI, HI, AVH, and verbally contracts for safety. Patient reports she slept fair last night. Patient reports her appetite as fair, energy level as normal, and concentration as good. Patient rates her depression 4/10, hopelessness 3/10, and anxiety 2/10. Patient denies physical symptoms/pain.    A: Scheduled medications administered per MD order. Support provided. Patient educated on safety on the unit and medications. Routine safety checks every 15 minutes. Patient stated understanding to tell nurse about any new physical symptoms. Patient understands to tell staff of any needs.     R: No adverse drug reactions noted. Patient remains safe at this time and will continue to monitor.    09/26/23 1100  Psych Admission Type (Psych Patients Only)  Admission Status Voluntary  Psychosocial Assessment  Patient Complaints Anxiety;Depression  Eye Contact Brief  Facial Expression Flat  Affect Flat  Speech Soft;Slow  Interaction Guarded;Minimal  Motor Activity Slow  Appearance/Hygiene Unremarkable  Behavior Characteristics Appropriate to situation  Mood Apprehensive  Thought Process  Coherency WDL  Content WDL  Delusions None reported or observed  Perception WDL  Hallucination None reported or observed  Judgment Limited  Confusion None  Danger to Self  Current suicidal ideation? Denies  Agreement Not to Harm Self Yes  Description of Agreement verbal  Danger to Others  Danger to Others None reported or observed

## 2023-09-27 DIAGNOSIS — F203 Undifferentiated schizophrenia: Secondary | ICD-10-CM | POA: Diagnosis not present

## 2023-09-27 MED ORDER — ARIPIPRAZOLE 10 MG PO TABS
20.0000 mg | ORAL_TABLET | Freq: Every day | ORAL | Status: DC
Start: 2023-09-27 — End: 2023-09-27

## 2023-09-27 MED ORDER — ARIPIPRAZOLE 15 MG PO TABS
15.0000 mg | ORAL_TABLET | Freq: Every day | ORAL | Status: DC
  Administered 2023-09-27: 15 mg via ORAL
  Filled 2023-09-27 (×3): qty 1

## 2023-09-27 MED ORDER — ARIPIPRAZOLE 10 MG PO TABS
20.0000 mg | ORAL_TABLET | Freq: Every day | ORAL | Status: DC
  Administered 2023-09-28 – 2023-10-02 (×5): 20 mg via ORAL
  Filled 2023-09-27 (×6): qty 2

## 2023-09-27 NOTE — Group Note (Signed)
Date:  09/27/2023 Time:  11:50 AM  Group Topic/Focus:  Goals Group:   The focus of this group is to help patients establish daily goals to achieve during treatment and discuss how the patient can incorporate goal setting into their daily lives to aide in recovery.    Participation Level:  Minimal  Participation Quality:  Attentive  Affect:  Blunted  Cognitive:  Appropriate  Insight: Good  Engagement in Group:  Limited  Modes of Intervention:  Discussion  Additional Comments:     Reymundo Poll 09/27/2023, 11:50 AM

## 2023-09-27 NOTE — Progress Notes (Signed)
Patient refused hs medication. Patient refused AM Blood draw. Support and encouragement provided.

## 2023-09-27 NOTE — Group Note (Signed)
Date:  09/27/2023 Time:  9:36 PM  Group Topic/Focus:  Wrap-Up Group:   The focus of this group is to help patients review their daily goal of treatment and discuss progress on daily workbooks.    Participation Level:  Did Not Attend  Participation Quality:   N/A  Affect:   N/A  Cognitive:   N/A  Insight: None  Engagement in Group:   N/A  Modes of Intervention:   N/A  Additional Comments:  Patient did not attend wrap up group.  Sarah Solis 09/27/2023, 9:36 PM

## 2023-09-27 NOTE — Group Note (Signed)
Recreation Therapy Group Note   Group Topic:Stress Management  Group Date: 09/27/2023 Start Time: 0935 End Time: 1000 Facilitators: Ryeleigh Santore-McCall, LRT,CTRS Location: [address] Dayroom   Goal Area(s) Addresses:  Patient will actively participate in stress management techniques presented during session.  Patient will successfully identify benefit of practicing stress management post d/c.   Intervention: Relaxation exercise with ambient sound and script   Group Description: Guided Imagery. LRT provided education, instruction, and demonstration on practice of visualization via guided imagery. Patient was asked to participate in the technique introduced during session. LRT debriefed including topics of mindfulness, stress management and specific scenarios each patient could use these techniques. Patients were given suggestions of ways to access scripts post d/c and encouraged to explore Youtube and other apps available on smartphones, tablets, and computers.  Education:  Stress Management, Discharge Planning.   Education Outcome: Acknowledges education   Affect/Mood: Appropriate   Participation Level: Moderate   Participation Quality: Independent   Behavior: Appropriate   Speech/Thought Process: Barely audible    Insight: Fair   Judgement: Fair    Modes of Intervention: Script   Patient Response to Interventions:  Attentive   Education Outcome:  In group clarification offered    Clinical Observations/Individualized Feedback: Pt sat quietly by the phone as script was read. Pt didn't appear to engage in the activity but was respectful of peers who did engage.     Plan: Continue to engage patient in RT group sessions 2-3x/week.   Clive Parcel-McCall, LRT,CTRS  09/27/2023 12:57 PM

## 2023-09-27 NOTE — [facility] Group Notes (Signed)
Adult Psychoeducational Group Note  Date:  09/27/2023 Time:  9:18 PM  Group Topic/Focus:  Wrap-Up Group:   The focus of this group is to help patients review their daily goal of treatment and discuss progress on daily workbooks.  Participation Level:  Active  Participation Quality:  Appropriate  Affect:  Appropriate  Cognitive:  Appropriate  Insight: Appropriate  Engagement in Group:  Engaged  Modes of Intervention:  Discussion and Support  Additional Comments:  Pt attended the evening AA meeting.  [name] 09/27/2023, 9:18 PM

## 2023-09-27 NOTE — Progress Notes (Signed)
   09/27/23 1000  Psych Admission Type (Psych Patients Only)  Admission Status Voluntary  Psychosocial Assessment  Patient Complaints Anxiety;Depression  Eye Contact Brief  Facial Expression Flat  Affect Flat  Speech Soft;Slow  Interaction Guarded  Motor Activity Slow  Appearance/Hygiene Unremarkable  Behavior Characteristics Appropriate to situation  Mood Depressed  Thought Process  Coherency Loose associations  Content Blaming others  Delusions Paranoid  Perception Hallucinations  Hallucination Auditory  Judgment Limited  Confusion None  Danger to Self  Current suicidal ideation? Denies  Agreement Not to Harm Self Yes  Description of Agreement verbal  Danger to Others  Danger to Others None reported or observed

## 2023-09-27 NOTE — [facility] Group Notes (Signed)
Spiritual care group on grief and loss facilitated by Chaplain Dyanne Carrel, [facility]  Group Goal: Support / Education around grief and loss  Members engage in facilitated group support and psycho-social education.  Group Description:  Following introductions and group rules, group members engaged in facilitated group dialogue and support around topic of loss, with particular support around experiences of loss in their lives. Group Identified types of loss (relationships / self / things) and identified patterns, circumstances, and changes that precipitate losses. Reflected on thoughts / feelings around loss, normalized grief responses, and recognized variety in grief experience. Group encouraged individual reflection on safe space and on the coping skills that they are already utilizing.  Group drew on Adlerian / Rogerian and narrative framework  Patient Progress: Sarah Solis attended group.  While verbal participation was limited, she demonstrated engagement with the conversation and activiteis.

## 2023-09-27 NOTE — Progress Notes (Signed)
[facility] MD Progress Note  09/27/2023 2:51 PM Sarah Solis  MRN:  [id number]  Subjective:   Sarah Solis is a 22 yo AAF with prior mental health diagnosis of Schizophrenia who presented to the [facility] behavioral health urgent care with complaints of  bizarre behaviors at home as per mother who accompanied her. As per documentation from the [facility]: "Per the patient's mother patient has been acting strange, walking around the house naked going into rooms waking up other family members. According to mom patient has been off her medication for 4 months when asked why the patient off her medicine mom stated that when the patient feels better she would stop taking her medications. According to mom patient was prescribed Risperdal 01 mg during the morning and 3 mg at bedtime." [name], NP, Date of Service: 09/22/2023 10:16 PM).  Pt's last hospitalization at this [facility] was on 08/06/2022. Pt was transferred from the [facility] to this [facility] voluntarily for this hospitalization for treatment and stabilization of her mental status.   24 hr chart review: Pt has remained guarded over the past 24 hours, speaks minimally, but has been appropriate in the milieu and attending unit group sessions, has been visible in the milieu, is med compliant,  & V/S are WNL witlh exception of heart rate which was slightly elevated at 112 earlier today morning.  Sleeping through the night as per nursing reports.  Patient assessment note: During encounter today, patient is disheveled, seems to be paying very little attention to personal hygiene needs and grooming, is malodorous, educated and positively reinforced on the need to tend to personal hygiene.  Personal hygiene items provided.  Patient is remaining guarded, eye contact is minimal, she appears to have thought blocking, provides single word responses to questions being asked, seems frustrated at times with questions being asked.  She is minimizing symptoms, and there is a high  likelihood that she is not providing accurate responses to questions being asked.  Patient is asked to rate depression, and states 0, with 10 being worse, then states 5 on the same scale.  She however appears very flat and depressed, and as per objective assessment, depression can be rated as 8, 10 being worst.  She is visibly anxious, as she rocks back and forth during assessment, denies SI/HI/VH.  She does admit to auditory hallucinations of voices that come and go, and is a poor historian at this time.  She states "sometimes I hear them back-to-back from the inside coming, and there are many of them".  She denies all first rank symptoms.  She reports a good appetite, but is apparently not eating well, and she is unable to state what she ate for breakfast.  Nursing has been asked to monitor patient's meals, and also ensure that she is drinking enough fluids.  She reports a good sleep quality last night, denies side effects of medications, denies any issues with her bowel movements, states that the last one was yesterday, denies being in any physical pain.  No TD/EPS type symptoms found on assessment, and pt denies any feelings of stiffness. AIMS: 0.  We are increasing Abilify to 20 mg daily for psychosis and mood stabilization starting tomorrow 10/22.  We are continuing to adjust medications at this time to hopefully treat and stabilize mental status, and thereby requiring continuous hospitalization.  Principal Problem: Undifferentiated schizophrenia (HCC) Diagnosis: Principal Problem:   Undifferentiated schizophrenia (HCC) Active Problems:   Insomnia   Anxiety   Paroxysmal ventricular tachycardia (HCC)  Obesity  Total Time spent with patient: 20 minutes  Past Psychiatric History:  Previous Psych Diagnoses: Schizophreniform d/o Prior inpatient treatment: 08/06/2022 (this was first inpatient behavioral health hospitalization) Current/prior outpatient treatment: none Prior rehab ZH:[id number]   Psychotherapy IO:[id number]  History of suicide attempts:denies History of homicide or aggression: denies  Psychiatric medication history:Risperdal, Remeron, Trazodone. Psychiatric medication compliance history:non compliance Neuromodulation history: denies Current Psychiatrist:denies having one Current therapist: denies having one    Past Medical History: History reviewed. No pertinent past medical history.  Past Surgical History:  Procedure Laterality Date   INCISION AND DRAINAGE PERIRECTAL ABSCESS Left 10/16/2020   Procedure: IRRIGATION AND DEBRIDEMENT PERIRECTAL ABSCESS;  Surgeon: Abigail Miyamoto, MD;  Location: [facility] OR;  Service: General;  Laterality: Left;   Family History:  Family History  Problem Relation Age of Onset   Schizophrenia Father    Family Psychiatric  History: See H&P  Social History:  Social History   Substance and Sexual Activity  Alcohol Use Not Currently     Social History   Substance and Sexual Activity  Drug Use Not Currently    Social History   Socioeconomic History   Marital status: Single    Spouse name: Not on file   Number of children: Not on file   Years of education: Not on file   Highest education level: Not on file  Occupational History   Not on file  Tobacco Use   Smoking status: Never   Smokeless tobacco: Never  Vaping Use   Vaping status: Some Days  Substance and Sexual Activity   Alcohol use: Not Currently   Drug use: Not Currently   Sexual activity: Not Currently  Other Topics Concern   Not on file  Social History Narrative   Not currently working but looking to return to work.    Lives with mother, mother's husband, 2 sisters, and brother.   Social Determinants of Health   Financial Resource Strain: Medium Risk (08/31/2022)   Overall Financial Resource Strain (CARDIA)    Difficulty of Paying Living Expenses: Somewhat hard  Food Insecurity: No Food Insecurity (09/23/2023)   Hunger Vital Sign    Worried About  Running Out of Food in the Last Year: Never true    Ran Out of Food in the Last Year: Never true  Transportation Needs: Unmet Transportation Needs (09/23/2023)   PRAPARE - Administrator, Civil Service (Medical): Yes    Lack of Transportation (Non-Medical): Yes  Physical Activity: Inactive (08/31/2022)   Exercise Vital Sign    Days of Exercise per Week: 0 days    Minutes of Exercise per Session: 0 min  Stress: Stress Concern Present (08/31/2022)   [employer] of Occupational Health - Occupational Stress Questionnaire    Feeling of Stress : To some extent  Social Connections: Socially Isolated (08/31/2022)   Social Connection and Isolation Panel [NHANES]    Frequency of Communication with Friends and Family: Never    Frequency of Social Gatherings with Friends and Family: Never    Attends Religious Services: 1 to 4 times per year    Active Member of [employer] or Organizations: No    Attends Banker Meetings: Never    Marital Status: Never married  Current Medications: Current Facility-Administered Medications  Medication Dose Route Frequency Provider Last Rate Last Admin   acetaminophen (TYLENOL) tablet 650 mg  650 mg Oral Q6H PRN White, Patrice L, NP       alum & mag hydroxide-simeth (MAALOX/MYLANTA) 200-200-20  MG/5ML suspension 30 mL  30 mL Oral Q4H PRN White, Patrice L, NP       [START ON 09/28/2023] ARIPiprazole (ABILIFY) tablet 20 mg  20 mg Oral Daily Massengill, Nathan, MD       diphenhydrAMINE (BENADRYL) capsule 50 mg  50 mg Oral TID PRN White, Patrice L, NP       Or   diphenhydrAMINE (BENADRYL) injection 50 mg  50 mg Intramuscular TID PRN White, Patrice L, NP       haloperidol (HALDOL) tablet 5 mg  5 mg Oral TID PRN White, Patrice L, NP       Or   haloperidol lactate (HALDOL) injection 5 mg  5 mg Intramuscular TID PRN White, Patrice L, NP       hydrOXYzine (ATARAX) tablet 25 mg  25 mg Oral TID PRN White, Patrice L, NP       influenza vac split trivalent  PF (FLULAVAL) injection 0.5 mL  0.5 mL Intramuscular Tomorrow-1000 Massengill, Nathan, MD       LORazepam (ATIVAN) tablet 2 mg  2 mg Oral TID PRN White, Patrice L, NP       Or   LORazepam (ATIVAN) injection 2 mg  2 mg Intramuscular TID PRN White, Patrice L, NP       magnesium hydroxide (MILK OF MAGNESIA) suspension 30 mL  30 mL Oral Daily PRN White, Patrice L, NP       metFORMIN (GLUCOPHAGE-XR) 24 hr tablet 500 mg  500 mg Oral Q breakfast Riyad Keena, NP   500 mg at 09/27/23 0820   propranolol (INDERAL) tablet 10 mg  10 mg Oral Q12H Massengill, Harrold Donath, MD   10 mg at 09/27/23 0820   Lab Results:  No results found for this or any previous visit (from the past 48 hour(s)).  Blood Alcohol level:  Lab Results  Component Value Date   ETH <10 09/23/2023   ETH <10 08/17/2022   Metabolic Disorder Labs: Lab Results  Component Value Date   HGBA1C 5.6 09/23/2023   MPG 114.02 09/23/2023   MPG 99.67 08/04/2022   No results found for: "PROLACTIN" Lab Results  Component Value Date   CHOL 117 09/23/2023   TRIG 45 09/23/2023   HDL 34 (L) 09/23/2023   CHOLHDL 3.4 09/23/2023   VLDL 9 09/23/2023   LDLCALC 74 09/23/2023   LDLCALC 103 (H) 08/04/2022   Physical Findings: AIMS: 0 CIWA: n/a COWS:  n/a  Psychiatric Specialty Exam:  Presentation  General Appearance:  Disheveled  Eye Contact: Fair  Speech: Blocked  Speech Volume: Decreased  Handedness: Right   Mood and Affect  Mood: Depressed  Affect: Congruent   Thought Process  Thought Processes: Coherent  Descriptions of Associations:Intact  Orientation:Partial  Thought Content:Logical  History of Schizophrenia/Schizoaffective disorder:Yes  Duration of Psychotic Symptoms:Greater than six months  Hallucinations:Hallucinations: Auditory   Ideas of Reference:Paranoia  Suicidal Thoughts:Suicidal Thoughts: No   Homicidal Thoughts:Homicidal Thoughts: No    Sensorium  Memory: Recent  Fair  Judgment: Poor  Insight: Poor   Executive Functions  Concentration: Poor  Attention Span: Poor  Recall: Poor  Fund of Knowledge: Poor  Language: Poor   Psychomotor Activity  Psychomotor Activity: Psychomotor Activity: Normal    Assets  Assets: Resilience   Sleep  Sleep: Sleep: Good     Physical Exam: Physical Exam Vitals reviewed.  Constitutional:      General: She is not in acute distress.    Appearance: She is not toxic-appearing.  Pulmonary:  Effort: Pulmonary effort is normal.  Neurological:     Mental Status: She is alert.     Motor: No weakness.     Gait: Gait normal.  Psychiatric:        Behavior: Behavior normal.    Review of Systems  Constitutional:  Negative for chills and fever.  Cardiovascular:  Negative for chest pain and palpitations.  Neurological:  Negative for dizziness, tingling, tremors and headaches.  Psychiatric/Behavioral:  Positive for depression and hallucinations. Negative for memory loss, substance abuse and suicidal ideas. The patient is nervous/anxious. The patient does not have insomnia.   All other systems reviewed and are negative.  Blood pressure (!) 112/97, pulse 96, temperature 98.6 F (37 C), temperature source Oral, resp. rate 16, height 5\' 6"  (1.676 m), weight 92.1 kg, SpO2 100%. Body mass index is 32.77 kg/m.   Treatment Plan Summary: Daily contact with patient to assess and evaluate symptoms and progress in treatment and Medication management  ASSESSMENT:  Diagnoses / Active Problems: Schizophrenia  PLAN: Safety and Monitoring:  --  Voluntary admission to inpatient psychiatric unit for safety, stabilization and treatment  -- Daily contact with patient to assess and evaluate symptoms and progress in treatment  -- Patient's case to be discussed in multi-disciplinary team meeting  -- Observation Level : q15 minute checks  -- Vital signs:  q12 hours  -- Precautions: suicide, elopement,  and assault  2. Psychiatric Diagnoses and Treatment:    -Increase Abilify from 15 mg to 20 mg once daily for psychosis/mood stabilization starting 10/22.  -Continue metformin 500 mg daily to mitigate weight gain from antipsychotics  -Continue Inderal 10 mg BID for anxiety/tachycardia  -Continue Hydroxyzine 25 mg TID PRN for anxiety -Continue Agitation protocol medication: Benadryl/Haldol/Ativan PRN   -Previously discontinued on admission Risperdal due to concerns for weight gain -Previously discontinued on admission Remeron due to weight gain  --  The risks/benefits/side-effects/alternatives to this medication were discussed in detail with the patient and time was given for questions. The patient consents to medication trial.    -- Metabolic profile and EKG monitoring obtained while on an atypical antipsychotic (BMI: Lipid Panel: HbgA1c: QTc:)   -- Encouraged patient to participate in unit milieu and in scheduled group therapies    3. Medical Issues Being Addressed:  Labs Pending: Vitamin D, CBC with diff  4. Discharge Planning:   -- Social work and case management to assist with discharge planning and identification of hospital follow-up needs prior to discharge  -- Estimated LOS: 5-7 days  -- Discharge Concerns: Need to establish a safety plan; Medication compliance and effectiveness  -- Discharge Goals: Return home with outpatient referrals for mental health follow-up including medication management/psychotherapy   Starleen Blue, NP 09/27/2023, 2:51 PM  Total Time Spent in Direct Patient Care:  I personally spent 45 minutes on the unit in direct patient care. The direct patient care time included face-to-face time with the patient, reviewing the patient's chart, communicating with other professionals, and coordinating care. Greater than 50% of this time was spent in counseling or coordinating care with the patient regarding goals of hospitalization, psycho-education, and  discharge planning needs.   Patient ID: Sarah Solis, female   DOB: 09-Aug-2001, 22 y.o.   MRN: [id number]

## 2023-09-28 DIAGNOSIS — F203 Undifferentiated schizophrenia: Secondary | ICD-10-CM | POA: Diagnosis not present

## 2023-09-28 LAB — CBC WITH DIFFERENTIAL/PLATELET
Abs Immature Granulocytes: 0.03 10*3/uL (ref 0.00–0.07)
Basophils Absolute: 0.1 10*3/uL (ref 0.0–0.1)
Basophils Relative: 1 %
Eosinophils Absolute: 0.2 10*3/uL (ref 0.0–0.5)
Eosinophils Relative: 2 %
HCT: 39.2 % (ref 36.0–46.0)
Hemoglobin: 12.3 g/dL (ref 12.0–15.0)
Immature Granulocytes: 0 %
Lymphocytes Relative: 36 %
Lymphs Abs: 2.6 10*3/uL (ref 0.7–4.0)
MCH: 26.6 pg (ref 26.0–34.0)
MCHC: 31.4 g/dL (ref 30.0–36.0)
MCV: 84.8 fL (ref 80.0–100.0)
Monocytes Absolute: 0.5 10*3/uL (ref 0.1–1.0)
Monocytes Relative: 6 %
Neutro Abs: 3.9 10*3/uL (ref 1.7–7.7)
Neutrophils Relative %: 55 %
Platelets: 320 10*3/uL (ref 150–400)
RBC: 4.62 MIL/uL (ref 3.87–5.11)
RDW: 13.2 % (ref 11.5–15.5)
WBC: 7.2 10*3/uL (ref 4.0–10.5)
nRBC: 0 % (ref 0.0–0.2)

## 2023-09-28 LAB — VITAMIN D 25 HYDROXY (VIT D DEFICIENCY, FRACTURES): Vit D, 25-Hydroxy: 19.51 ng/mL — ABNORMAL LOW (ref 30–100)

## 2023-09-28 MED ORDER — VITAMIN D (ERGOCALCIFEROL) 1.25 MG (50000 UNIT) PO CAPS
50000.0000 [IU] | ORAL_CAPSULE | ORAL | Status: DC
  Administered 2023-09-28: 50000 [IU] via ORAL
  Filled 2023-09-28: qty 1

## 2023-09-28 NOTE — Progress Notes (Signed)
[facility] MD Progress Note  09/28/2023 2:24 PM Cassedy Guba  MRN:  [id number]  Subjective:   Arvena Calamari is a 22 yo AAF with prior mental health diagnosis of Schizophrenia who presented to the [facility] behavioral health urgent care with complaints of  bizarre behaviors at home as per mother who accompanied her. As per documentation from the [facility]: "Per the patient's mother patient has been acting strange, walking around the house naked going into rooms waking up other family members. According to mom patient has been off her medication for 4 months when asked why the patient off her medicine mom stated that when the patient feels better she would stop taking her medications. According to mom patient was prescribed Risperdal 01 mg during the morning and 3 mg at bedtime." [name], NP, Date of Service: 09/22/2023 10:16 PM).  Pt's last hospitalization at this [facility] was on 08/06/2022. Pt was transferred from the [facility] to this [facility] voluntarily for this hospitalization for treatment and stabilization of her mental status.   Patient assessment note: During today's encounter, mood remains depressed, and affect is congruent. Pt is still presenting guarded, and is thought blocking, and is minimizing her symptoms, providing very limited responses to the questions that are being asked. She is also isolating herself in her room, reports that she is feeling better, denies that she is hearing voices, but tells Clinical research associate, that whenever she has AH, she isolates by staying to herself, which is what she is doing today. It can be deduced from pt's actions that she is still having AH, but is not being forthcoming due to fears that this hospitalization might be prolonged.  Attention to personal hygiene and grooming has also not improved, patient is requiring multiple positive verbal reinforcements to take care of personal hygiene needs and grooming.  Today, she denies SI/HI, denies AVH, appears guarded and suspicious, denies  delusional thinking.  She reports that she is eating at mealtimes, and states that earlier today for breakfast, she ate some grits and eggs.  She reports that her concentration level today is low, energy level is low, she reports a fair sleep quality last night, denies being in any physical pain, denies medication related side effects.  Provided with positive reinforcements to take a shower, take care of other personal hygiene needs, and make efforts to attend unit group sessions.  Verbalizes understanding.  No TD/EPS type symptoms found on assessment, and pt denies any feelings of stiffness. AIMS: 0.  We increased Abilify to 20 mg daily for psychosis and mood stabilization starting today morning (10/22).  Psychosis is still persistent and pt is still continuing to need hospitalization, while we adjust medications to treat and stabilize her mental status.  We will continue to reassess on a daily basis.  Principal Problem: Undifferentiated schizophrenia (HCC) Diagnosis: Principal Problem:   Undifferentiated schizophrenia (HCC) Active Problems:   Insomnia   Anxiety   Paroxysmal ventricular tachycardia (HCC)   Obesity  Total Time spent with patient: 20 minutes  Past Psychiatric History:  Previous Psych Diagnoses: Schizophreniform d/o Prior inpatient treatment: 08/06/2022 (this was first inpatient behavioral health hospitalization) Current/prior outpatient treatment: none Prior rehab JX:[id number]  Psychotherapy NF:[id number]  History of suicide attempts:denies History of homicide or aggression: denies  Psychiatric medication history:Risperdal, Remeron, Trazodone. Psychiatric medication compliance history:non compliance Neuromodulation history: denies Current Psychiatrist:denies having one Current therapist: denies having one    Past Medical History: History reviewed. No pertinent past medical history.  Past Surgical History:  Procedure Laterality Date  INCISION AND DRAINAGE PERIRECTAL ABSCESS  Left 10/16/2020   Procedure: IRRIGATION AND DEBRIDEMENT PERIRECTAL ABSCESS;  Surgeon: Abigail Miyamoto, MD;  Location: [facility] OR;  Service: General;  Laterality: Left;   Family History:  Family History  Problem Relation Age of Onset   Schizophrenia Father    Family Psychiatric  History: See H&P  Social History:  Social History   Substance and Sexual Activity  Alcohol Use Not Currently     Social History   Substance and Sexual Activity  Drug Use Not Currently    Social History   Socioeconomic History   Marital status: Single    Spouse name: Not on file   Number of children: Not on file   Years of education: Not on file   Highest education level: Not on file  Occupational History   Not on file  Tobacco Use   Smoking status: Never   Smokeless tobacco: Never  Vaping Use   Vaping status: Some Days  Substance and Sexual Activity   Alcohol use: Not Currently   Drug use: Not Currently   Sexual activity: Not Currently  Other Topics Concern   Not on file  Social History Narrative   Not currently working but looking to return to work.    Lives with mother, mother's husband, 2 sisters, and brother.   Social Determinants of Health   Financial Resource Strain: Medium Risk (08/31/2022)   Overall Financial Resource Strain (CARDIA)    Difficulty of Paying Living Expenses: Somewhat hard  Food Insecurity: No Food Insecurity (09/23/2023)   Hunger Vital Sign    Worried About Running Out of Food in the Last Year: Never true    Ran Out of Food in the Last Year: Never true  Transportation Needs: Unmet Transportation Needs (09/23/2023)   PRAPARE - Administrator, Civil Service (Medical): Yes    Lack of Transportation (Non-Medical): Yes  Physical Activity: Inactive (08/31/2022)   Exercise Vital Sign    Days of Exercise per Week: 0 days    Minutes of Exercise per Session: 0 min  Stress: Stress Concern Present (08/31/2022)   [employer] of Occupational Health -  Occupational Stress Questionnaire    Feeling of Stress : To some extent  Social Connections: Socially Isolated (08/31/2022)   Social Connection and Isolation Panel [NHANES]    Frequency of Communication with Friends and Family: Never    Frequency of Social Gatherings with Friends and Family: Never    Attends Religious Services: 1 to 4 times per year    Active Member of [employer] or Organizations: No    Attends Engineer, structural: Never    Marital Status: Never married  Current Medications: Current Facility-Administered Medications  Medication Dose Route Frequency Provider Last Rate Last Admin   acetaminophen (TYLENOL) tablet 650 mg  650 mg Oral Q6H PRN White, Patrice L, NP       alum & mag hydroxide-simeth (MAALOX/MYLANTA) 200-200-20 MG/5ML suspension 30 mL  30 mL Oral Q4H PRN White, Patrice L, NP       ARIPiprazole (ABILIFY) tablet 20 mg  20 mg Oral Daily Massengill, Nathan, MD   20 mg at 09/28/23 0801   diphenhydrAMINE (BENADRYL) capsule 50 mg  50 mg Oral TID PRN White, Patrice L, NP       Or   diphenhydrAMINE (BENADRYL) injection 50 mg  50 mg Intramuscular TID PRN White, Patrice L, NP       haloperidol (HALDOL) tablet 5 mg  5 mg Oral TID  PRN Liborio Nixon L, NP       Or   haloperidol lactate (HALDOL) injection 5 mg  5 mg Intramuscular TID PRN White, Patrice L, NP       hydrOXYzine (ATARAX) tablet 25 mg  25 mg Oral TID PRN White, Patrice L, NP   25 mg at 09/27/23 2146   influenza vac split trivalent PF (FLULAVAL) injection 0.5 mL  0.5 mL Intramuscular Tomorrow-1000 Massengill, Nathan, MD       LORazepam (ATIVAN) tablet 2 mg  2 mg Oral TID PRN White, Patrice L, NP       Or   LORazepam (ATIVAN) injection 2 mg  2 mg Intramuscular TID PRN White, Patrice L, NP       magnesium hydroxide (MILK OF MAGNESIA) suspension 30 mL  30 mL Oral Daily PRN White, Patrice L, NP       metFORMIN (GLUCOPHAGE-XR) 24 hr tablet 500 mg  500 mg Oral Q breakfast Starleen Blue, NP   500 mg at 09/28/23 0801    propranolol (INDERAL) tablet 10 mg  10 mg Oral Q12H Massengill, Harrold Donath, MD   10 mg at 09/28/23 0801   Lab Results:  Results for orders placed or performed during the hospital encounter of 09/23/23 (from the past 48 hour(s))  CBC with Differential/Platelet     Status: None   Collection Time: 09/28/23  6:26 AM  Result Value Ref Range   WBC 7.2 4.0 - 10.5 K/uL   RBC 4.62 3.87 - 5.11 MIL/uL   Hemoglobin 12.3 12.0 - 15.0 g/dL   HCT 78.2 95.6 - 21.3 %   MCV 84.8 80.0 - 100.0 fL   MCH 26.6 26.0 - 34.0 pg   MCHC 31.4 30.0 - 36.0 g/dL   RDW 08.6 57.8 - 46.9 %   Platelets 320 150 - 400 K/uL   nRBC 0.0 0.0 - 0.2 %   Neutrophils Relative % 55 %   Neutro Abs 3.9 1.7 - 7.7 K/uL   Lymphocytes Relative 36 %   Lymphs Abs 2.6 0.7 - 4.0 K/uL   Monocytes Relative 6 %   Monocytes Absolute 0.5 0.1 - 1.0 K/uL   Eosinophils Relative 2 %   Eosinophils Absolute 0.2 0.0 - 0.5 K/uL   Basophils Relative 1 %   Basophils Absolute 0.1 0.0 - 0.1 K/uL   Immature Granulocytes 0 %   Abs Immature Granulocytes 0.03 0.00 - 0.07 K/uL    Comment: Performed at [facility], [address], [place], [place] [address]  VITAMIN D 25 Hydroxy (Vit-D Deficiency, Fractures)     Status: Abnormal   Collection Time: 09/28/23  6:26 AM  Result Value Ref Range   Vit D, 25-Hydroxy 19.51 (L) 30 - 100 ng/mL    Comment: (NOTE) Vitamin D deficiency has been defined by the Institute of Medicine  and an Endocrine Society practice guideline as a level of serum 25-OH  vitamin D less than 20 ng/mL (1,2). The Endocrine Society went on to  further define vitamin D insufficiency as a level between 21 and 29  ng/mL (2).  1. IOM (Institute of Medicine). 2010. Dietary reference intakes for  calcium and D. [place]: [employer]. 2. Holick MF, Binkley [place], Bischoff-Ferrari HA, et al. Evaluation,  treatment, and prevention of vitamin D deficiency: an Endocrine  Society clinical practice guideline,  JCEM. 2011 Jul; 96(7): 1911-30.  Performed at [facility], [address], [place], [place] [address]     Blood  Alcohol level:  Lab Results  Component Value Date   ETH <10 09/23/2023   ETH <10 08/17/2022   Metabolic Disorder Labs: Lab Results  Component Value Date   HGBA1C 5.6 09/23/2023   MPG 114.02 09/23/2023   MPG 99.67 08/04/2022   No results found for: "PROLACTIN" Lab Results  Component Value Date   CHOL 117 09/23/2023   TRIG 45 09/23/2023   HDL 34 (L) 09/23/2023   CHOLHDL 3.4 09/23/2023   VLDL 9 09/23/2023   LDLCALC 74 09/23/2023   LDLCALC 103 (H) 08/04/2022   Physical Findings: AIMS: 0 CIWA: n/a COWS:  n/a  Psychiatric Specialty Exam:  Presentation  General Appearance:  Appropriate for Environment; Disheveled  Eye Contact: Fair  Speech: Clear and Coherent  Speech Volume: Decreased  Handedness: Right   Mood and Affect  Mood: Depressed  Affect: Congruent   Thought Process  Thought Processes: Coherent  Descriptions of Associations:Intact  Orientation:Partial  Thought Content:Logical  History of Schizophrenia/Schizoaffective disorder:Yes  Duration of Psychotic Symptoms:Greater than six months  Hallucinations:Hallucinations: Auditory   Ideas of Reference:Paranoia  Suicidal Thoughts:Suicidal Thoughts: No   Homicidal Thoughts:Homicidal Thoughts: No    Sensorium  Memory: Recent Poor  Judgment: Poor  Insight: Poor   Executive Functions  Concentration: Fair  Attention Span: Fair  Recall: Poor  Fund of Knowledge: Poor  Language: Fair   Psychomotor Activity  Psychomotor Activity: Psychomotor Activity: Normal  Assets  Assets: Resilience   Sleep  Sleep: Sleep: Fair  Physical Exam: Physical Exam Vitals reviewed.  Constitutional:      General: She is not in acute distress.    Appearance: She is not toxic-appearing.  Pulmonary:     Effort: Pulmonary effort is normal.  Neurological:      Mental Status: She is alert.     Motor: No weakness.     Gait: Gait normal.  Psychiatric:        Behavior: Behavior normal.    Review of Systems  Constitutional:  Negative for chills and fever.  Cardiovascular:  Negative for chest pain and palpitations.  Neurological:  Negative for dizziness, tingling, tremors and headaches.  Psychiatric/Behavioral:  Positive for depression and hallucinations. Negative for memory loss, substance abuse and suicidal ideas. The patient is nervous/anxious. The patient does not have insomnia.   All other systems reviewed and are negative.  Blood pressure 100/67, pulse (!) 107, temperature 99 F (37.2 C), temperature source Oral, resp. rate 14, height 5\' 6"  (1.676 m), weight 92.1 kg, SpO2 100%. Body mass index is 32.77 kg/m.   Treatment Plan Summary: Daily contact with patient to assess and evaluate symptoms and progress in treatment and Medication management  ASSESSMENT:  Diagnoses / Active Problems: Schizophrenia  PLAN: Safety and Monitoring:  --  Voluntary admission to inpatient psychiatric unit for safety, stabilization and treatment  -- Daily contact with patient to assess and evaluate symptoms and progress in treatment  -- Patient's case to be discussed in multi-disciplinary team meeting  -- Observation Level : q15 minute checks  -- Vital signs:  q12 hours  -- Precautions: suicide, elopement, and assault  2. Psychiatric Diagnoses and Treatment:    -Continue Abilify 20 mg once daily for psychosis/mood stabilization.  -Continue metformin 500 mg daily to mitigate weight gain from antipsychotics  -Continue Inderal 10 mg BID for anxiety/tachycardia  -Continue Hydroxyzine 25 mg TID PRN for anxiety -Continue Agitation protocol medication: Benadryl/Haldol/Ativan PRN   -Previously discontinued on admission Risperdal due to concerns for weight gain -Previously discontinued on  admission Remeron due to weight gain  --  The  risks/benefits/side-effects/alternatives to this medication were discussed in detail with the patient and time was given for questions. The patient consents to medication trial.    -- Metabolic profile and EKG monitoring obtained while on an atypical antipsychotic (BMI: Lipid Panel: HbgA1c: QTc:)   -- Encouraged patient to participate in unit milieu and in scheduled group therapies    3. Medical Issues Being Addressed:  Labs Pending: Vitamin D low at 19.51, will supplement with 50.000 units. CBC WNL.   4. Discharge Planning:   -- Social work and case management to assist with discharge planning and identification of hospital follow-up needs prior to discharge  -- Estimated LOS: 5-7 days  -- Discharge Concerns: Need to establish a safety plan; Medication compliance and effectiveness  -- Discharge Goals: Return home with outpatient referrals for mental health follow-up including medication management/psychotherapy   Starleen Blue, NP 09/28/2023, 2:24 PM  Total Time Spent in Direct Patient Care:  I personally spent 45 minutes on the unit in direct patient care. The direct patient care time included face-to-face time with the patient, reviewing the patient's chart, communicating with other professionals, and coordinating care. Greater than 50% of this time was spent in counseling or coordinating care with the patient regarding goals of hospitalization, psycho-education, and discharge planning needs.   Patient ID: Kimiye Barman, female   DOB: 2001-05-04, 22 y.o.   MRN: [id number]

## 2023-09-28 NOTE — Plan of Care (Signed)
  Problem: Coping: Goal: Ability to demonstrate self-control will improve Outcome: Progressing   Problem: Health Behavior/Discharge Planning: Goal: Compliance with treatment plan for underlying cause of condition will improve Outcome: Progressing   Problem: Physical Regulation: Goal: Ability to maintain clinical measurements within normal limits will improve Outcome: Progressing   

## 2023-09-28 NOTE — Progress Notes (Addendum)
D. Pt continues to present flat, guarded- appears preoccupied- minimal during interactions, but has been visible in the milieu, observed attending groups.  Pt currently denies SI/HI and AVH A. Labs and vitals monitored. Pt given and educated on medications. Pt supported emotionally and encouraged to express concerns and ask questions.   R. Pt remains safe with 15 minute checks. Will continue POC.  co  09/28/23 0900  Psych Admission Type (Psych Patients Only)  Admission Status Voluntary  Psychosocial Assessment  Patient Complaints None  Eye Contact Brief  Facial Expression Flat  Affect Preoccupied  Speech Soft;Slow  Interaction Guarded;Forwards little  Motor Activity Slow  Appearance/Hygiene Unremarkable  Behavior Characteristics Cooperative  Mood Depressed;Preoccupied  Thought Process  Coherency WDL  Content WDL  Delusions Paranoid  Perception Hallucinations  Hallucination Auditory  Judgment Limited  Confusion None  Danger to Self  Current suicidal ideation? Denies  Agreement Not to Harm Self Yes  Description of Agreement agreed to contact staff before acting on harmful thoughts  Danger to Others  Danger to Others None reported or observed

## 2023-09-28 NOTE — Group Note (Signed)
LCSW Group Therapy Note  Group Date: 09/28/2023 Start Time: 1100 End Time: 1145   Type of Therapy and Topic:  Group Therapy - How To Cope with Nervousness about Discharge   Participation Level:  None   Description of Group This process group involved identification of patients' feelings about discharge. Some of them are scheduled to be discharged soon, while others are new admissions, but each of them was asked to share thoughts and feelings surrounding discharge from the hospital. One common theme was that they are excited at the prospect of going home, while another was that many of them are apprehensive about sharing why they were hospitalized. Patients were given the opportunity to discuss these feelings with their peers in preparation for discharge.  Therapeutic Goals  Patient will identify their overall feelings about pending discharge. Patient will think about how they might proactively address issues that they believe will once again arise once they get home (i.e. with parents). Patients will participate in discussion about having hope for change.   Summary of Patient Progress:  Sarah Solis did not engage during the group.CSW. Sarah Solis was respectful of peers and participated throughout the entire session.   Therapeutic Modalities Cognitive Behavioral Therapy   Ane Payment, LCSW 09/28/2023  2:57 PM

## 2023-09-28 NOTE — Progress Notes (Signed)

## 2023-09-28 NOTE — Group Note (Signed)
Date:  09/28/2023 Time:  10:35 AM  Group Topic/Focus:  Orientation:   The focus of this group is to educate the patient on the purpose and policies of crisis stabilization and provide a format to answer questions about their admission.  The group details unit policies and expectations of patients while admitted.    Participation Level:  Active  Participation Quality:  Appropriate  Affect:  Appropriate  Cognitive:  Appropriate  Insight: Appropriate  Engagement in Group:  Engaged  Modes of Intervention:  Discussion  Additional Comments:     Reymundo Poll 09/28/2023, 10:35 AM

## 2023-09-28 NOTE — Group Note (Signed)
Recreation Therapy Group Note   Group Topic:Animal Assisted Therapy   Group Date: 09/28/2023 Start Time: 1005 End Time: 1040 Facilitators: Terek Bee-McCall, LRT,CTRS Location: [address] Day[address]   Animal-Assisted Activity (AAA) Program Checklist/Progress Notes Patient Eligibility Criteria Checklist & Daily Group note for Rec Tx Intervention  AAA/T Program Assumption of Risk Form signed by Patient/ or Parent Legal Guardian Yes  Patient is free of allergies or severe asthma Yes  Patient reports no fear of animals Yes  Patient reports no history of cruelty to animals Yes  Patient understands his/her participation is voluntary Yes  Patient washes hands before animal contact Yes  Patient washes hands after animal contact Yes  Education: Hand Washing, Appropriate Animal Interaction   Education Outcome: Acknowledges education.    Affect/Mood: Flat   Participation Level: Minimal   Participation Quality: Independent   Behavior: On-looking   Speech/Thought Process: Barely audible    Insight: Moderate   Judgement: Moderate   Modes of Intervention: Teaching laboratory technician   Patient Response to Interventions:  Attentive   Education Outcome:  In group clarification offered    Clinical Observations/Individualized Feedback: Pt was quiet and had minimal interaction with therapy dog team. Pt eventually left early and did not return.     Plan: Continue to engage patient in RT group sessions 2-3x/week.   Betzabe Bevans-McCall, LRT,CTRS 09/28/2023 12:44 PM

## 2023-09-28 NOTE — [facility] Group Notes (Signed)
[facility] Group Notes:  (Nursing/MHT/Case Management/Adjunct)  Date:  09/28/2023  Time:  9:37 PM  Type of Therapy:  Psychoeducational Skills  Participation Level:  Active  Participation Quality:  Attentive  Affect:  Flat  Cognitive:  Appropriate  Insight:  Good  Engagement in Group:  Developing/Improving  Modes of Intervention:  Education  Summary of Progress/Problems: Patient rated her day as a 7 out of a possible 10. She explained that she felt better today and that she was feeling okay. Her goal for tomorrow is to be around people more often.   Sarah Solis 09/28/2023, 9:37 PM

## 2023-09-29 ENCOUNTER — Encounter (HOSPITAL_COMMUNITY): Payer: Self-pay

## 2023-09-29 DIAGNOSIS — F203 Undifferentiated schizophrenia: Secondary | ICD-10-CM | POA: Diagnosis not present

## 2023-09-29 MED ORDER — FLUOXETINE HCL 20 MG PO CAPS
20.0000 mg | ORAL_CAPSULE | Freq: Every day | ORAL | Status: DC
  Administered 2023-09-30 – 2023-10-02 (×3): 20 mg via ORAL
  Filled 2023-09-29 (×4): qty 1

## 2023-09-29 MED ORDER — FLUOXETINE HCL 10 MG PO CAPS
10.0000 mg | ORAL_CAPSULE | Freq: Every day | ORAL | Status: DC
  Administered 2023-09-29: 10 mg via ORAL
  Filled 2023-09-29 (×4): qty 1

## 2023-09-29 NOTE — [facility] Group Notes (Signed)

## 2023-09-29 NOTE — Plan of Care (Signed)
  Problem: Education: Goal: Emotional status will improve Outcome: Progressing   Problem: Activity: Goal: Interest or engagement in activities will improve Outcome: Progressing Goal: Sleeping patterns will improve Outcome: Progressing   Problem: Safety: Goal: Periods of time without injury will increase Outcome: Progressing

## 2023-09-29 NOTE — Plan of Care (Signed)
°  Problem: Education: °Goal: Emotional status will improve °Outcome: Progressing °Goal: Mental status will improve °Outcome: Progressing °Goal: Verbalization of understanding the information provided will improve °Outcome: Progressing °  °

## 2023-09-29 NOTE — Progress Notes (Signed)
   09/29/23 0828  Psych Admission Type (Psych Patients Only)  Admission Status Voluntary  Psychosocial Assessment  Patient Complaints Depression  Eye Contact Brief  Facial Expression Flat  Affect Depressed  Speech Soft  Interaction Guarded  Motor Activity Slow  Appearance/Hygiene Unremarkable  Behavior Characteristics Cooperative  Mood Depressed  Thought Process  Coherency WDL  Content WDL  Delusions Paranoid  Perception WDL  Hallucination None reported or observed  Judgment Limited  Confusion None  Danger to Self  Current suicidal ideation? Denies  Agreement Not to Harm Self Yes  Description of Agreement Verbal  Danger to Others  Danger to Others None reported or observed

## 2023-09-29 NOTE — [facility] Group Notes (Signed)
[facility] Group Notes:  (Nursing/MHT/Case Management/Adjunct)  Date:  09/29/2023  Time:  8:57 AM  Type of Therapy:  Group Topic/ Focus: Goals Group: The focus of this group is to help patients establish daily goals to achieve during treatment and discuss how the patient can incorporate goal setting into their daily lives to aide in recovery.   Participation Level:  Active  Participation Quality:  Appropriate  Affect:  Appropriate  Cognitive:  Appropriate  Insight:  Appropriate  Engagement in Group:  Engaged  Modes of Intervention:  Discussion  Summary of Progress/Problems:  Patient attended and participated goals group today. Patient's goal for today is to work on her discharge plan.   Daneil Dan 09/29/2023, 8:57 AM

## 2023-09-29 NOTE — Progress Notes (Signed)
   09/28/23 2000  Psych Admission Type (Psych Patients Only)  Admission Status Voluntary  Psychosocial Assessment  Patient Complaints Anxiety;Depression  Eye Contact Brief  Facial Expression Flat  Affect Appropriate to circumstance  Speech Soft  Interaction Forwards little  Motor Activity Slow  Appearance/Hygiene Unremarkable  Behavior Characteristics Cooperative  Mood Depressed  Thought Process  Coherency WDL  Content WDL  Delusions Paranoid  Perception WDL  Hallucination Auditory  Judgment Limited  Confusion None  Danger to Self  Current suicidal ideation? Denies (Denies)  Agreement Not to Harm Self Yes  Danger to Others  Danger to Others None reported or observed   Patient has been pleasant this shift interacting well with Peers and Staff. She has improved visible in Milieu. Denies SI/HI/A/VH at present complaint with medications. No adverse effects noted. Support and encouragement provided.

## 2023-09-29 NOTE — BH IP Treatment Plan (Signed)
Interdisciplinary Treatment and Diagnostic Plan Update  09/29/2023 Time of Session: 755 Sarah Solis MRN: [id number]  Principal Diagnosis: Undifferentiated schizophrenia [facility])  Secondary Diagnoses: Principal Problem:   Undifferentiated schizophrenia (HCC) Active Problems:   Insomnia   Anxiety   Paroxysmal ventricular tachycardia (HCC)   Obesity   Current Medications:  Current Facility-Administered Medications  Medication Dose Route Frequency Provider Last Rate Last Admin   acetaminophen (TYLENOL) tablet 650 mg  650 mg Oral Q6H PRN White, Patrice L, NP       alum & mag hydroxide-simeth (MAALOX/MYLANTA) 200-200-20 MG/5ML suspension 30 mL  30 mL Oral Q4H PRN White, Patrice L, NP       ARIPiprazole (ABILIFY) tablet 20 mg  20 mg Oral Daily Massengill, Harrold Donath, MD   20 mg at 09/28/23 0801   diphenhydrAMINE (BENADRYL) capsule 50 mg  50 mg Oral TID PRN White, Patrice L, NP       Or   diphenhydrAMINE (BENADRYL) injection 50 mg  50 mg Intramuscular TID PRN White, Patrice L, NP       haloperidol (HALDOL) tablet 5 mg  5 mg Oral TID PRN White, Patrice L, NP       Or   haloperidol lactate (HALDOL) injection 5 mg  5 mg Intramuscular TID PRN White, Patrice L, NP       hydrOXYzine (ATARAX) tablet 25 mg  25 mg Oral TID PRN White, Patrice L, NP   25 mg at 09/27/23 2146   influenza vac split trivalent PF (FLULAVAL) injection 0.5 mL  0.5 mL Intramuscular Tomorrow-1000 Massengill, Nathan, MD       LORazepam (ATIVAN) tablet 2 mg  2 mg Oral TID PRN White, Patrice L, NP       Or   LORazepam (ATIVAN) injection 2 mg  2 mg Intramuscular TID PRN White, Patrice L, NP       magnesium hydroxide (MILK OF MAGNESIA) suspension 30 mL  30 mL Oral Daily PRN White, Patrice L, NP       metFORMIN (GLUCOPHAGE-XR) 24 hr tablet 500 mg  500 mg Oral Q breakfast Starleen Blue, NP   500 mg at 09/28/23 0801   propranolol (INDERAL) tablet 10 mg  10 mg Oral Q12H Massengill, Harrold Donath, MD   10 mg at 09/28/23 2113   Vitamin D  (Ergocalciferol) (DRISDOL) 1.25 MG (50000 UNIT) capsule 50,000 Units  50,000 Units Oral Q7 days Starleen Blue, NP   50,000 Units at 09/28/23 1711   PTA Medications: Medications Prior to Admission  Medication Sig Dispense Refill Last Dose   Ergocalciferol (VITAMIN D2) 10 MCG (400 UNIT) TABS Take 800 Units by mouth in the morning. (2 tablets) (Patient not taking: Reported on 09/23/2023) 60 tablet 2    mirtazapine (REMERON) 15 MG tablet Take 1 tablet (15 mg total) by mouth at bedtime. 30 tablet 2    risperiDONE (RISPERDAL) 0.5 MG tablet Take 1 tablet (0.5 mg total) by mouth 2 (two) times daily.       Patient Stressors: Financial difficulties   Health problems   Medication change or noncompliance    Patient Strengths: Ability for insight  Communication skills  Supportive family/friends   Treatment Modalities: Medication Management, Group therapy, Case management,  1 to 1 session with clinician, Psychoeducation, Recreational therapy.   Physician Treatment Plan for Primary Diagnosis: Undifferentiated schizophrenia (HCC) Long Term Goal(s): Improvement in symptoms so as ready for discharge   Short Term Goals: Ability to identify changes in lifestyle to reduce recurrence of condition will improve Ability  to verbalize feelings will improve Ability to demonstrate self-control will improve Ability to identify and develop effective coping behaviors will improve Ability to maintain clinical measurements within normal limits will improve Compliance with prescribed medications will improve  Medication Management: Evaluate patient's response, side effects, and tolerance of medication regimen.  Therapeutic Interventions: 1 to 1 sessions, Unit Group sessions and Medication administration.  Evaluation of Outcomes: Progressing  Physician Treatment Plan for Secondary Diagnosis: Principal Problem:   Undifferentiated schizophrenia (HCC) Active Problems:   Insomnia   Anxiety   Paroxysmal  ventricular tachycardia (HCC)   Obesity  Long Term Goal(s): Improvement in symptoms so as ready for discharge   Short Term Goals: Ability to identify changes in lifestyle to reduce recurrence of condition will improve Ability to verbalize feelings will improve Ability to demonstrate self-control will improve Ability to identify and develop effective coping behaviors will improve Ability to maintain clinical measurements within normal limits will improve Compliance with prescribed medications will improve     Medication Management: Evaluate patient's response, side effects, and tolerance of medication regimen.  Therapeutic Interventions: 1 to 1 sessions, Unit Group sessions and Medication administration.  Evaluation of Outcomes: Progressing   RN Treatment Plan for Primary Diagnosis: Undifferentiated schizophrenia (HCC) Long Term Goal(s): Knowledge of disease and therapeutic regimen to maintain health will improve  Short Term Goals: Ability to remain free from injury will improve, Ability to verbalize frustration and anger appropriately will improve, Ability to demonstrate self-control, Ability to participate in decision making will improve, Ability to verbalize feelings will improve, Ability to disclose and discuss suicidal ideas, Ability to identify and develop effective coping behaviors will improve, and Compliance with prescribed medications will improve  Medication Management: RN will administer medications as ordered by provider, will assess and evaluate patient's response and provide education to patient for prescribed medication. RN will report any adverse and/or side effects to prescribing provider.  Therapeutic Interventions: 1 on 1 counseling sessions, Psychoeducation, Medication administration, Evaluate responses to treatment, Monitor vital signs and CBGs as ordered, Perform/monitor CIWA, COWS, AIMS and Fall Risk screenings as ordered, Perform wound care treatments as  ordered.  Evaluation of Outcomes: Progressing   LCSW Treatment Plan for Primary Diagnosis: Undifferentiated schizophrenia (HCC) Long Term Goal(s): Safe transition to appropriate next level of care at discharge, Engage patient in therapeutic group addressing interpersonal concerns.  Short Term Goals: Engage patient in aftercare planning with referrals and resources, Increase social support, Increase ability to appropriately verbalize feelings, Increase emotional regulation, Facilitate acceptance of mental health diagnosis and concerns, Facilitate patient progression through stages of change regarding substance use diagnoses and concerns, Identify triggers associated with mental health/substance abuse issues, and Increase skills for wellness and recovery  Therapeutic Interventions: Assess for all discharge needs, 1 to 1 time with Social worker, Explore available resources and support systems, Assess for adequacy in community support network, Educate family and significant other(s) on suicide prevention, Complete Psychosocial Assessment, Interpersonal group therapy.  Evaluation of Outcomes: Progressing   Progress in Treatment: Attending groups: Yes. Participating in groups: Yes. Taking medication as prescribed: Yes. Toleration medication: Yes. Family/Significant other contact made: Yes, individual(s) contacted:   [name] (Mom) [phone] Patient understands diagnosis: Yes. Discussing patient identified problems/goals with staff: Yes. Medical problems stabilized or resolved: Yes. Denies suicidal/homicidal ideation: Yes. Issues/concerns per patient self-inventory: Yes. Other: N/A  New problem(s) identified: No, Describe:  None Reported  New Short Term/Long Term Goal(s): medication management for mood stabilization; elimination of SI thoughts; development of comprehensive mental  wellness/sobriety plan   Patient Goals:  Medication Stabilization  Discharge Plan or Barriers: CSW  will continue to follow and assess for appropriate referrals and possible discharge planning.   Reason for Continuation of Hospitalization: Delusions  Depression Hallucinations Medication stabilization  Estimated Length of Stay: 5-7 Days  Last 3 [place] Suicide Severity Risk Score: Flowsheet Row Ad[facility] (Current) [facility] from 09/22/2023 in [facility] Counselor from 08/31/2022 in [facility]  C-SSRS RISK CATEGORY No Risk No Risk No Risk       Last [facility] 2/9 Scores:    09/30/2022    8:16 AM 08/31/2022    8:17 AM  Depression screen PHQ 2/9  Decreased Interest 0 2  Down, Depressed, Hopeless 2 2  PHQ - 2 Score 2 4  Altered sleeping 1 3  Tired, decreased energy 1 3  Change in appetite 3 0  Feeling bad or failure about yourself  1 3  Trouble concentrating 0 0  Moving slowly or fidgety/restless 0 0  Suicidal thoughts 0 0  PHQ-9 Score 8 13  Difficult doing work/chores Not difficult at all Somewhat difficult     medication stabilization, elimination of SI thoughts, development of comprehensive mental wellness plan.   Scribe for Treatment Team: Ane Payment, LCSW 09/29/2023 7:55 AM

## 2023-09-29 NOTE — Progress Notes (Signed)
[facility] MD Progress Note  09/29/2023 4:33 PM Sarah Solis  MRN:  [id number]  Subjective:   Sarah Solis is a 22 yo AAF with prior mental health diagnosis of Schizophrenia who presented to the [facility] behavioral health urgent care with complaints of  bizarre behaviors at home as per mother who accompanied her. As per documentation from the [facility]: "Per the patient's mother patient has been acting strange, walking around the house naked going into rooms waking up other family members. According to mom patient has been off her medication for 4 months when asked why the patient off her medicine mom stated that when the patient feels better she would stop taking her medications. According to mom patient was prescribed Risperdal 01 mg during the morning and 3 mg at bedtime." [name], NP, Date of Service: 09/22/2023 10:16 PM).  Pt's last hospitalization at this [facility] was on 08/06/2022. Pt was transferred from [facility] to this [facility] voluntarily for this hospitalization for treatment and stabilization of her mental status.   Patient assessment note: Chart reviewed, findings discussed with treatment team, patient is continuing to be medication compliant, no behavioral concerns overnight, nursing reports that patient is less isolative in her room, and more visible in the day room.  VS WNL, even though the BP has been just slightly low.  Given hydroxyzine 25 mg last night for anxiety.  On assessment today, the pt reports that their mood is still depressed, but she is more expressive of her emotions, and is tearful during assessment.  Objectively, patient's mood remains depressed, and affect is congruent.  She rates her depression today as 6, 10 being worse. Reports that anxiety is still high, even though patient rates it her fall, with 10 being worse, she appears to be more anxious than she is stating. Sleep is good as per patient Appetite is improving Concentration is fair Energy level is still  low Denies suicidal thoughts.  Denies suicidal intent and plan.  Denies having any HI.  Denies having psychotic symptoms.  However, continues to appear guarded, even though she is more talkative as compared to previous days.  Earlier today morning, she stated that she ate grits, sausage, eggs for breakfast.  Patient was asked when the last time was that she heard voices, and she stated "not in a while".  She was asked what this means, and stated "a couple of days".  She denies paranoia.  Denies having side effects to current psychiatric medications.  We discussed changes to current medication regimen, including starting Prozac for depressive symptoms and GAD. Patient is continuing to require hospitalization at this time, as she remains very depressed, and we are continuing to adjust medications to treat and stabilize her mental status.  Discharge planning will be revisited on a daily basis.  Principal Problem: Undifferentiated schizophrenia (HCC) Diagnosis: Principal Problem:   Undifferentiated schizophrenia (HCC) Active Problems:   Insomnia   Anxiety   Paroxysmal ventricular tachycardia (HCC)   Obesity  Total Time spent with patient: 45 minutes  Past Psychiatric History:  Previous Psych Diagnoses: Schizophreniform d/o Prior inpatient treatment: 08/06/2022 (this was first inpatient behavioral health hospitalization) Current/prior outpatient treatment: none Prior rehab KG:MWNUUV  Psychotherapy OZ:DGUYQI  History of suicide attempts:denies History of homicide or aggression: denies  Psychiatric medication history:Risperdal, Remeron, Trazodone. Psychiatric medication compliance history:non compliance Neuromodulation history: denies Current Psychiatrist:denies having one Current therapist: denies having one    Past Medical History: History reviewed. No pertinent past medical history.  Past Surgical History:  Procedure Laterality Date   INCISION AND DRAINAGE PERIRECTAL ABSCESS Left  10/16/2020   Procedure: IRRIGATION AND DEBRIDEMENT PERIRECTAL ABSCESS;  Surgeon: Abigail Miyamoto, MD;  Location: [facility] OR;  Service: General;  Laterality: Left;   Family History:  Family History  Problem Relation Age of Onset   Schizophrenia Father    Family Psychiatric  History: See H&P  Social History:  Social History   Substance and Sexual Activity  Alcohol Use Not Currently     Social History   Substance and Sexual Activity  Drug Use Not Currently    Social History   Socioeconomic History   Marital status: Single    Spouse name: Not on file   Number of children: Not on file   Years of education: Not on file   Highest education level: Not on file  Occupational History   Not on file  Tobacco Use   Smoking status: Never   Smokeless tobacco: Never  Vaping Use   Vaping status: Some Days  Substance and Sexual Activity   Alcohol use: Not Currently   Drug use: Not Currently   Sexual activity: Not Currently  Other Topics Concern   Not on file  Social History Narrative   Not currently working but looking to return to work.    Lives with mother, mother's husband, 2 sisters, and brother.   Social Determinants of Health   Financial Resource Strain: Medium Risk (08/31/2022)   Overall Financial Resource Strain (CARDIA)    Difficulty of Paying Living Expenses: Somewhat hard  Food Insecurity: No Food Insecurity (09/23/2023)   Hunger Vital Sign    Worried About Running Out of Food in the Last Year: Never true    Ran Out of Food in the Last Year: Never true  Transportation Needs: Unmet Transportation Needs (09/23/2023)   PRAPARE - Administrator, Civil Service (Medical): Yes    Lack of Transportation (Non-Medical): Yes  Physical Activity: Inactive (08/31/2022)   Exercise Vital Sign    Days of Exercise per Week: 0 days    Minutes of Exercise per Session: 0 min  Stress: Stress Concern Present (08/31/2022)   [employer] of Occupational Health - Occupational  Stress Questionnaire    Feeling of Stress : To some extent  Social Connections: Socially Isolated (08/31/2022)   Social Connection and Isolation Panel [NHANES]    Frequency of Communication with Friends and Family: Never    Frequency of Social Gatherings with Friends and Family: Never    Attends Religious Services: 1 to 4 times per year    Active Member of [employer] or Organizations: No    Attends Engineer, structural: Never    Marital Status: Never married  Current Medications: Current Facility-Administered Medications  Medication Dose Route Frequency Provider Last Rate Last Admin   acetaminophen (TYLENOL) tablet 650 mg  650 mg Oral Q6H PRN White, Patrice L, NP       alum & mag hydroxide-simeth (MAALOX/MYLANTA) 200-200-20 MG/5ML suspension 30 mL  30 mL Oral Q4H PRN White, Patrice L, NP       ARIPiprazole (ABILIFY) tablet 20 mg  20 mg Oral Daily Massengill, Nathan, MD   20 mg at 09/29/23 0857   diphenhydrAMINE (BENADRYL) capsule 50 mg  50 mg Oral TID PRN White, Patrice L, NP       Or   diphenhydrAMINE (BENADRYL) injection 50 mg  50 mg Intramuscular TID PRN White, Patrice L, NP       [START ON 09/30/2023] FLUoxetine (PROZAC)  capsule 20 mg  20 mg Oral Daily Massengill, Nathan, MD       haloperidol (HALDOL) tablet 5 mg  5 mg Oral TID PRN White, Patrice L, NP       Or   haloperidol lactate (HALDOL) injection 5 mg  5 mg Intramuscular TID PRN White, Patrice L, NP       hydrOXYzine (ATARAX) tablet 25 mg  25 mg Oral TID PRN White, Patrice L, NP   25 mg at 09/27/23 2146   influenza vac split trivalent PF (FLULAVAL) injection 0.5 mL  0.5 mL Intramuscular Tomorrow-1000 Massengill, Nathan, MD       LORazepam (ATIVAN) tablet 2 mg  2 mg Oral TID PRN White, Patrice L, NP       Or   LORazepam (ATIVAN) injection 2 mg  2 mg Intramuscular TID PRN White, Patrice L, NP       magnesium hydroxide (MILK OF MAGNESIA) suspension 30 mL  30 mL Oral Daily PRN White, Patrice L, NP       metFORMIN (GLUCOPHAGE-XR)  24 hr tablet 500 mg  500 mg Oral Q breakfast Starleen Blue, NP   500 mg at 09/29/23 0857   propranolol (INDERAL) tablet 10 mg  10 mg Oral Q12H Massengill, Harrold Donath, MD   10 mg at 09/29/23 1610   Vitamin D (Ergocalciferol) (DRISDOL) 1.25 MG (50000 UNIT) capsule 50,000 Units  50,000 Units Oral Q7 days Starleen Blue, NP   50,000 Units at 09/28/23 1711   Lab Results:  Results for orders placed or performed during the hospital encounter of 09/23/23 (from the past 48 hour(s))  CBC with Differential/Platelet     Status: None   Collection Time: 09/28/23  6:26 AM  Result Value Ref Range   WBC 7.2 4.0 - 10.5 K/uL   RBC 4.62 3.87 - 5.11 MIL/uL   Hemoglobin 12.3 12.0 - 15.0 g/dL   HCT 96.0 45.4 - 09.8 %   MCV 84.8 80.0 - 100.0 fL   MCH 26.6 26.0 - 34.0 pg   MCHC 31.4 30.0 - 36.0 g/dL   RDW 11.9 14.7 - 82.9 %   Platelets 320 150 - 400 K/uL   nRBC 0.0 0.0 - 0.2 %   Neutrophils Relative % 55 %   Neutro Abs 3.9 1.7 - 7.7 K/uL   Lymphocytes Relative 36 %   Lymphs Abs 2.6 0.7 - 4.0 K/uL   Monocytes Relative 6 %   Monocytes Absolute 0.5 0.1 - 1.0 K/uL   Eosinophils Relative 2 %   Eosinophils Absolute 0.2 0.0 - 0.5 K/uL   Basophils Relative 1 %   Basophils Absolute 0.1 0.0 - 0.1 K/uL   Immature Granulocytes 0 %   Abs Immature Granulocytes 0.03 0.00 - 0.07 K/uL    Comment: Performed at [facility], [address], [place], [place] [address]  VITAMIN D 25 Hydroxy (Vit-D Deficiency, Fractures)     Status: Abnormal   Collection Time: 09/28/23  6:26 AM  Result Value Ref Range   Vit D, 25-Hydroxy 19.51 (L) 30 - 100 ng/mL    Comment: (NOTE) Vitamin D deficiency has been defined by the Institute of Medicine  and an Endocrine Society practice guideline as a level of serum 25-OH  vitamin D less than 20 ng/mL (1,2). The Endocrine Society went on to  further define vitamin D insufficiency as a level between 21 and 29  ng/mL (2).  1. IOM (Institute of Medicine). 2010. Dietary reference  intakes for  calcium and  D. [place]: [employer]. 2. Holick MF, Binkley Eastlake, Bischoff-Ferrari HA, et al. Evaluation,  treatment, and prevention of vitamin D deficiency: an Endocrine  Society clinical practice guideline, JCEM. 2011 Jul; 96(7): 1911-30.  Performed at [facility], [address] [place], [place] [address]     Blood Alcohol level:  Lab Results  Component Value Date   [facility] <10 09/23/2023   ETH <10 08/17/2022   Metabolic Disorder Labs: Lab Results  Component Value Date   HGBA1C 5.6 09/23/2023   MPG 114.02 09/23/2023   MPG 99.67 08/04/2022   No results found for: "PROLACTIN" Lab Results  Component Value Date   CHOL 117 09/23/2023   TRIG 45 09/23/2023   HDL 34 (L) 09/23/2023   CHOLHDL 3.4 09/23/2023   VLDL 9 09/23/2023   LDLCALC 74 09/23/2023   LDLCALC 103 (H) 08/04/2022   Physical Findings: AIMS: 0 CIWA: n/a COWS:  n/a  Psychiatric Specialty Exam:  Presentation  General Appearance:  Disheveled  Eye Contact: Fair  Speech: Blocked  Speech Volume: Decreased  Handedness: Right   Mood and Affect  Mood: Depressed; Anxious  Affect: Congruent   Thought Process  Thought Processes: Coherent  Descriptions of Associations:Intact  Orientation:Full (Time, Place and Person)  Thought Content:Logical  History of Schizophrenia/Schizoaffective disorder:Yes  Duration of Psychotic Symptoms:Greater than six months  Hallucinations:Hallucinations: None   Ideas of Reference:None  Suicidal Thoughts:Suicidal Thoughts: No   Homicidal Thoughts:Homicidal Thoughts: No    Sensorium  Memory: Recent Fair  Judgment: Poor  Insight: Poor   Executive Functions  Concentration: Fair  Attention Span: Fair  Recall: Fair  Fund of Knowledge: Fair  Language: Fair   Psychomotor Activity  Psychomotor Activity: Psychomotor Activity: Normal  Assets  Assets: Resilience   Sleep  Sleep: Sleep:  Good  Physical Exam: Physical Exam Vitals reviewed.  Constitutional:      General: She is not in acute distress.    Appearance: She is not toxic-appearing.  Pulmonary:     Effort: Pulmonary effort is normal.  Neurological:     Mental Status: She is alert.     Motor: No weakness.     Gait: Gait normal.  Psychiatric:        Behavior: Behavior normal.    Review of Systems  Constitutional:  Negative for chills and fever.  Cardiovascular:  Negative for chest pain and palpitations.  Neurological:  Negative for dizziness, tingling, tremors and headaches.  Psychiatric/Behavioral:  Positive for depression and hallucinations. Negative for memory loss, substance abuse and suicidal ideas. The patient is nervous/anxious. The patient does not have insomnia.   All other systems reviewed and are negative.  Blood pressure (!) 111/57, pulse 77, temperature 97.8 F (36.6 C), temperature source Oral, resp. rate 14, height 5\' 6"  (1.676 m), weight 92.1 kg, SpO2 100%. Body mass index is 32.77 kg/m.   Treatment Plan Summary: Daily contact with patient to assess and evaluate symptoms and progress in treatment and Medication management  ASSESSMENT:  Diagnoses / Active Problems: Schizophrenia  PLAN: Safety and Monitoring:  --  Voluntary admission to inpatient psychiatric unit for safety, stabilization and treatment  -- Daily contact with patient to assess and evaluate symptoms and progress in treatment  -- Patient's case to be discussed in multi-disciplinary team meeting  -- Observation Level : q15 minute checks  -- Vital signs:  q12 hours  -- Precautions: suicide, elopement, and assault  2. Psychiatric Diagnoses and Treatment:    -Continue Abilify 20 mg  once daily for psychosis/mood stabilization.  -Increase Prozac to 20 mg for depressive symptoms on 10/24 -Continue metformin 500 mg daily to mitigate weight gain from antipsychotics  -Continue Inderal 10 mg BID for anxiety/tachycardia   -Continue Hydroxyzine 25 mg TID PRN for anxiety -Continue Agitation protocol medication: Benadryl/Haldol/Ativan PRN   -Previously discontinued on admission Risperdal due to concerns for weight gain -Previously discontinued on admission Remeron due to weight gain  --  The risks/benefits/side-effects/alternatives to this medication were discussed in detail with the patient and time was given for questions. The patient consents to medication trial.    -- Metabolic profile and EKG monitoring obtained while on an atypical antipsychotic (BMI: Lipid Panel: HbgA1c: QTc:)   -- Encouraged patient to participate in unit milieu and in scheduled group therapies    3. Medical Issues Being Addressed:  Labs Pending: Vitamin D low at 19.51, supplementing with 50.000 units. CBC WNL.   4. Discharge Planning:   -- Social work and case management to assist with discharge planning and identification of hospital follow-up needs prior to discharge  -- Estimated LOS: 5-7 days  -- Discharge Concerns: Need to establish a safety plan; Medication compliance and effectiveness  -- Discharge Goals: Return home with outpatient referrals for mental health follow-up including medication management/psychotherapy   Starleen Blue, NP 09/29/2023, 4:33 PM  Total Time Spent in Direct Patient Care:  I personally spent 45 minutes on the unit in direct patient care. The direct patient care time included face-to-face time with the patient, reviewing the patient's chart, communicating with other professionals, and coordinating care. Greater than 50% of this time was spent in counseling or coordinating care with the patient regarding goals of hospitalization, psycho-education, and discharge planning needs.   Patient ID: Sarah Solis, female   DOB: 2001-04-16, 22 y.o.   MRN: [id number]

## 2023-09-29 NOTE — Progress Notes (Signed)
D) Pt received calm, visible, participating in milieu, and in no acute distress. Pt A & O x4. Pt denies SI, HI, A/ V H, depression, anxiety and pain at this time. A) Pt encouraged to drink fluids. Pt encouraged to come to staff with needs. Pt encouraged to attend and participate in groups. Pt encouraged to set reachable goals.  R) Pt remained safe on unit, in no acute distress, will continue to assess.     09/29/23 2000  Psych Admission Type (Psych Patients Only)  Admission Status Voluntary  Psychosocial Assessment  Patient Complaints Depression  Eye Contact Brief  Facial Expression Flat  Affect Sad  Speech Soft  Interaction Minimal  Motor Activity  (wnl)  Appearance/Hygiene Unremarkable  Behavior Characteristics Cooperative  Mood Anxious  Thought Process  Coherency WDL  Content WDL  Delusions Paranoid  Perception WDL  Hallucination None reported or observed  Judgment Limited  Confusion None  Danger to Self  Current suicidal ideation? Denies  Agreement Not to Harm Self Yes  Description of Agreement verbal  Danger to Others  Danger to Others None reported or observed

## 2023-09-29 NOTE — Group Note (Signed)
Date:  09/29/2023 Time:  11:11 PM  Group Topic/Focus:  Narcotics Anonymous (NA) Meeting    Participation Level:  Active  Participation Quality:  Appropriate  Affect:  Appropriate  Cognitive:  Appropriate  Insight: Appropriate  Engagement in Group:  Engaged  Modes of Intervention:  Discussion and Support  Additional Comments:  Patient attended NA Meeting.   Sarah Solis 09/29/2023, 11:11 PM

## 2023-09-30 DIAGNOSIS — F203 Undifferentiated schizophrenia: Secondary | ICD-10-CM | POA: Diagnosis not present

## 2023-09-30 MED ORDER — ARIPIPRAZOLE ER 400 MG IM PRSY
400.0000 mg | PREFILLED_SYRINGE | INTRAMUSCULAR | Status: DC
  Administered 2023-09-30: 400 mg via INTRAMUSCULAR
  Filled 2023-09-30: qty 400

## 2023-09-30 NOTE — Progress Notes (Signed)
   09/30/23 2100  Psych Admission Type (Psych Patients Only)  Admission Status Voluntary  Psychosocial Assessment  Patient Complaints Anxiety  Eye Contact  (wnl)  Facial Expression Animated  Affect Sad  Speech Soft  Interaction Minimal  Motor Activity Other (Comment)  Appearance/Hygiene Unremarkable  Behavior Characteristics Cooperative  Mood Anxious  Thought Process  Coherency WDL  Content WDL  Delusions WDL  Perception WDL  Hallucination None reported or observed  Judgment Limited  Confusion None  Danger to Self  Current suicidal ideation? Denies  Agreement Not to Harm Self Yes  Description of Agreement verbal

## 2023-09-30 NOTE — Progress Notes (Signed)
Chaplain had received a consult for Sarah Solis earlier in the week and had not been able to see her.  Chaplain checked with her yesterday and she declined a visit at this time.

## 2023-09-30 NOTE — Plan of Care (Signed)
Nurse discussed anxiety with patient.  

## 2023-09-30 NOTE — Progress Notes (Signed)
[facility] MD Progress Note  09/30/2023 10:59 AM Sarah Solis  MRN:  [id number]  Subjective:   Sarah Solis is a 22 yo AAF with prior mental health diagnosis of Schizophrenia who presented to the [facility] behavioral health urgent care with complaints of  bizarre behaviors at home as per mother who accompanied her. As per documentation from the [facility] "Per the patient's mother patient has been acting strange, walking around the house naked going into rooms waking up other family members. According to mom patient has been off her medication for 4 months when asked why the patient off her medicine mom stated that when the patient feels better she would stop taking her medications. According to mom patient was prescribed Risperdal 01 mg during the morning and 3 mg at bedtime." [name], NP, Date of Service: 09/22/2023 10:16 PM).  Pt's last hospitalization at this [facility] was on 08/06/2022. Pt was transferred from the [facility] to this [facility] voluntarily for this hospitalization for treatment and stabilization of her mental status.   Yesterday the psychiatry team made the following recommendations: Increase Prozac to 20 mg daily for depression Continue Abilify 20 mg once daily for psychosis and mood stabilization  On assessment today, patient reports that her mood is still depressed, "but a little better since yesterday".  Reports less anxiety.  Reports that sleep and appetite are stable.  She denies having any side effects to current medications.  Concentration is still poor and there is some thought blocking.  She is not tearful during the interview today, which is an improvement since yesterday.  Denies any AH or VH.  Denies any paranoia.  Overall it seems the patient psychosis is stabilizing with Abilify, and patient is agreeable to getting the Abilify LAI today, as recommended by her mother due to history of nonadherence causing psychiatric decompensation and readmission.  Patient is  agreeable.    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 Psychiatric medication compliance history:non compliance Neuromodulation history: denies Current Psychiatrist:denies having one Current therapist: denies having one    Past Medical History: History reviewed. No pertinent past medical history.  Past Surgical History:  Procedure Laterality Date   INCISION AND DRAINAGE PERIRECTAL ABSCESS Left 10/16/2020   Procedure: IRRIGATION AND DEBRIDEMENT PERIRECTAL ABSCESS;  Surgeon: Abigail Miyamoto, MD;  Location: [facility] OR;  Service: General;  Laterality: Left;   Family History:  Family History  Problem Relation Age of Onset   Schizophrenia Father    Family Psychiatric  History: See H&P  Social History:  Social History   Substance and Sexual Activity  Alcohol Use Not Currently     Social History   Substance and Sexual Activity  Drug Use Not Currently    Social History   Socioeconomic History   Marital status: Single    Spouse name: Not on file   Number of children: Not on file   Years of education: Not on file   Highest education level: Not on file  Occupational History   Not on file  Tobacco Use  Smoking status: Never   Smokeless tobacco: Never  Vaping Use   Vaping status: Some Days  Substance and Sexual Activity   Alcohol use:  Not Currently   Drug use: Not Currently   Sexual activity: Not Currently  Other Topics Concern   Not on file  Social History Narrative   Not currently working but looking to return to work.    Lives with mother, mother's husband, 2 sisters, and brother.   Social Determinants of Health   Financial Resource Strain: Medium Risk (08/31/2022)   Overall Financial Resource Strain (CARDIA)    Difficulty of Paying Living Expenses: Somewhat hard  Food Insecurity: No Food Insecurity (09/23/2023)   Hunger Vital Sign    Worried About Running Out of Food in the Last Year: Never true    Ran Out of Food in the Last Year: Never true  Transportation Needs: Unmet Transportation Needs (09/23/2023)   PRAPARE - Administrator, Civil Service (Medical): Yes    Lack of Transportation (Non-Medical): Yes  Physical Activity: Inactive (08/31/2022)   Exercise Vital Sign    Days of Exercise per Week: 0 days    Minutes of Exercise per Session: 0 min  Stress: Stress Concern Present (08/31/2022)   [employer] of Occupational Health - Occupational Stress Questionnaire    Feeling of Stress : To some extent  Social Connections: Socially Isolated (08/31/2022)   Social Connection and Isolation Panel [NHANES]    Frequency of Communication with Friends and Family: Never    Frequency of Social Gatherings with Friends and Family: Never    Attends Religious Services: 1 to 4 times per year    Active Member of [employer] or Organizations: No    Attends Engineer, structural: Never    Marital Status: Never married  Current Medications: Current Facility-Administered Medications  Medication Dose Route Frequency Provider Last Rate Last Admin   acetaminophen (TYLENOL) tablet 650 mg  650 mg Oral Q6H PRN White, Patrice L, NP       alum & mag hydroxide-simeth (MAALOX/MYLANTA) 200-200-20 MG/5ML suspension 30 mL  30 mL Oral Q4H PRN White, Patrice L, NP       ARIPiprazole (ABILIFY) tablet 20 mg  20 mg Oral Daily  Iara Monds, Harrold Donath, MD   20 mg at 09/30/23 0814   ARIPiprazole ER (ABILIFY MAINTENA) injection 400 mg  400 mg Intramuscular Q28 days Anayelli Lai, Harrold Donath, MD       diphenhydrAMINE (BENADRYL) capsule 50 mg  50 mg Oral TID PRN White, Patrice L, NP       Or   diphenhydrAMINE (BENADRYL) injection 50 mg  50 mg Intramuscular TID PRN White, Patrice L, NP       FLUoxetine (PROZAC) capsule 20 mg  20 mg Oral Daily Yon Schiffman, MD   20 mg at 09/30/23 1696   haloperidol (HALDOL) tablet 5 mg  5 mg Oral TID PRN White, Patrice L, NP       Or   haloperidol lactate (HALDOL) injection 5 mg  5 mg Intramuscular TID PRN White, Patrice L, NP       hydrOXYzine (ATARAX) tablet 25 mg  25 mg Oral TID PRN White, Patrice L, NP   25 mg at 09/29/23 2124   influenza vac split trivalent PF (FLULAVAL) injection 0.5 mL  0.5 mL Intramuscular Tomorrow-1000 Hagen Bohorquez, MD       LORazepam (ATIVAN) tablet 2 mg  2 mg Oral TID PRN Layla Barter, NP       Or  LORazepam (ATIVAN) injection 2 mg  2 mg Intramuscular TID PRN White, Patrice L, NP       magnesium hydroxide (MILK OF MAGNESIA) suspension 30 mL  30 mL Oral Daily PRN White, Patrice L, NP       metFORMIN (GLUCOPHAGE-XR) 24 hr tablet 500 mg  500 mg Oral Q breakfast Nkwenti, Doris, NP   500 mg at 09/30/23 0814   propranolol (INDERAL) tablet 10 mg  10 mg Oral Q12H Murice Barbar, Harrold Donath, MD   10 mg at 09/30/23 0272   Vitamin D (Ergocalciferol) (DRISDOL) 1.25 MG (50000 UNIT) capsule 50,000 Units  50,000 Units Oral Q7 days Starleen Blue, NP   50,000 Units at 09/28/23 1711   Lab Results:  No results found for this or any previous visit (from the past 48 hour(s)).   Blood Alcohol level:  Lab Results  Component Value Date   ETH <10 09/23/2023   ETH <10 08/17/2022   Metabolic Disorder Labs: Lab Results  Component Value Date   HGBA1C 5.6 09/23/2023   MPG 114.02 09/23/2023   MPG 99.67 08/04/2022   No results found for: "PROLACTIN" Lab Results  Component Value  Date   CHOL 117 09/23/2023   TRIG 45 09/23/2023   HDL 34 (L) 09/23/2023   CHOLHDL 3.4 09/23/2023   VLDL 9 09/23/2023   LDLCALC 74 09/23/2023   LDLCALC 103 (H) 08/04/2022   Physical Findings: AIMS: 0 CIWA: n/a COWS:  n/a  Psychiatric Specialty Exam:  Presentation  General Appearance:  Casual  Eye Contact: Poor  Speech: Slow  Speech Volume: Decreased  Handedness: Right   Mood and Affect  Mood: Anxious; Depressed  Affect: Constricted   Thought Process  Thought Processes: Linear  Descriptions of Associations:Intact  Orientation:Full (Time, Place and Person)  Thought Content:Logical  History of Schizophrenia/Schizoaffective disorder:Yes  Duration of Psychotic Symptoms:Greater than six months  Hallucinations:Hallucinations: None   Ideas of Reference:None  Suicidal Thoughts:Suicidal Thoughts: No   Homicidal Thoughts:Homicidal Thoughts: No    Sensorium  Memory: Immediate Fair; Recent Fair; Remote Fair  Judgment: Poor  Insight: Fair   Chartered certified accountant: Fair  Attention Span: Fair  Recall: [employer] of Knowledge: Fair  Language: Fair   Psychomotor Activity  Psychomotor Activity: Psychomotor Activity: Normal  Assets  Assets: Resilience   Sleep  Sleep: Sleep: Fair  Physical Exam: Physical Exam Vitals reviewed.  Constitutional:      General: She is not in acute distress.    Appearance: She is not toxic-appearing.  Pulmonary:     Effort: Pulmonary effort is normal.  Neurological:     Mental Status: She is alert.     Motor: No weakness.     Gait: Gait normal.  Psychiatric:        Behavior: Behavior normal.    Review of Systems  Constitutional:  Negative for chills and fever.  Cardiovascular:  Negative for chest pain and palpitations.  Neurological:  Negative for dizziness, tingling, tremors and headaches.  Psychiatric/Behavioral:  Positive for depression and hallucinations. Negative for  memory loss, substance abuse and suicidal ideas. The patient is nervous/anxious. The patient does not have insomnia.   All other systems reviewed and are negative.  Blood pressure 107/78, pulse (!) 109, temperature 98.6 F (37 C), temperature source Oral, resp. rate 20, height 5\' 6"  (1.676 m), weight 92.1 kg, SpO2 100%. Body mass index is 32.77 kg/m.   Treatment Plan Summary: Daily contact with patient to assess and evaluate symptoms and progress in treatment and  Medication management  ASSESSMENT:  Diagnoses / Active Problems: Schizophrenia versus schizoaffective disorder depressive type  PLAN: Safety and Monitoring:  --  Voluntary admission to inpatient psychiatric unit for safety, stabilization and treatment  -- Daily contact with patient to assess and evaluate symptoms and progress in treatment  -- Patient's case to be discussed in multi-disciplinary team meeting  -- Observation Level : q15 minute checks  -- Vital signs:  q12 hours  -- Precautions: suicide, elopement, and assault  2. Psychiatric Diagnoses and Treatment:    -Continue Prozac 20 mg once daily for depression -Continue Abilify 20 mg once daily for psychosis/mood stabilization.  -Ordered Abilify Maintena 400 mg LAI for today.  Continue oral Abilify for 10 to 14 days after 10/24 2024 -Continue metformin 500 mg daily to mitigate weight gain from antipsychotics  -Continue Inderal 10 mg BID for anxiety/tachycardia  -Continue Hydroxyzine 25 mg TID PRN for anxiety -Continue Agitation protocol medication: Benadryl/Haldol/Ativan PRN   -Previously discontinued on admission Risperdal due to concerns for weight gain -Previously discontinued on admission Remeron due to weight gain  --  The risks/benefits/side-effects/alternatives to this medication were discussed in detail with the patient and time was given for questions. The patient consents to medication trial.    -- Metabolic profile and EKG monitoring obtained while on  an atypical antipsychotic (BMI: Lipid Panel: HbgA1c: QTc:)   -- Encouraged patient to participate in unit milieu and in scheduled group therapies    3. Medical Issues Being Addressed:  Labs Pending: Vitamin D low at 19.51, will supplement with 50.000 units. CBC WNL.   4. Discharge Planning:   -- Social work and case management to assist with discharge planning and identification of hospital follow-up needs prior to discharge  -- Estimated LOS: 2-3 more days  -- Discharge Concerns: Need to establish a safety plan; Medication compliance and effectiveness  -- Discharge Goals: Return home with outpatient referrals for mental health follow-up including medication management/psychotherapy   Phineas Inches, MD 09/30/2023, 10:59 AM  Total Time Spent in Direct Patient Care:  I personally spent 35 minutes on the unit in direct patient care. The direct patient care time included face-to-face time with the patient, reviewing the patient's chart, communicating with other professionals, and coordinating care. Greater than 50% of this time was spent in counseling or coordinating care with the patient regarding goals of hospitalization, psycho-education, and discharge planning needs.

## 2023-09-30 NOTE — Plan of Care (Signed)
  Problem: Education: Goal: Knowledge of [facility] Education information/materials will improve Outcome: Progressing Goal: Emotional status will improve Outcome: Progressing Goal: Mental status will improve Outcome: Progressing Goal: Verbalization of understanding the information provided will improve Outcome: Progressing   

## 2023-09-30 NOTE — Plan of Care (Signed)
  Problem: Education: Goal: Knowledge of [facility] Education information/materials will improve 09/30/2023 1950 by Jearl Klinefelter, RN Outcome: Progressing 09/30/2023 1935 by Jearl Klinefelter, RN Outcome: Progressing Goal: Emotional status will improve 09/30/2023 1950 by Jearl Klinefelter, RN Outcome: Progressing 09/30/2023 1935 by Jearl Klinefelter, RN Outcome: Progressing Goal: Mental status will improve 09/30/2023 1950 by Jearl Klinefelter, RN Outcome: Progressing 09/30/2023 1935 by Jearl Klinefelter, RN Outcome: Progressing Goal: Verbalization of understanding the information provided will improve 09/30/2023 1950 by Jearl Klinefelter, RN Outcome: Progressing 09/30/2023 1935 by Jearl Klinefelter, RN Outcome: Progressing   Problem: Activity: Goal: Interest or engagement in activities will improve Outcome: Progressing Goal: Sleeping patterns will improve Outcome: Progressing   Problem: Coping: Goal: Ability to verbalize frustrations and anger appropriately will improve Outcome: Progressing Goal: Ability to demonstrate self-control will improve Outcome: Progressing   Problem: Health Behavior/Discharge Planning: Goal: Identification of resources available to assist in meeting health care needs will improve Outcome: Progressing Goal: Compliance with treatment plan for underlying cause of condition will improve Outcome: Progressing   Problem: Physical Regulation: Goal: Ability to maintain clinical measurements within normal limits will improve Outcome: Progressing   Problem: Safety: Goal: Periods of time without injury will increase Outcome: Progressing   Problem: Education: Goal: Knowledge of [facility] Education information/materials will improve Outcome: Progressing Goal: Emotional status will improve Outcome: Progressing Goal: Mental status will improve Outcome: Progressing Goal: Verbalization of understanding the information provided will  improve Outcome: Progressing   Problem: Activity: Goal: Interest or engagement in activities will improve Outcome: Progressing Goal: Sleeping patterns will improve Outcome: Progressing   Problem: Coping: Goal: Ability to verbalize frustrations and anger appropriately will improve Outcome: Progressing Goal: Ability to demonstrate self-control will improve Outcome: Progressing   Problem: Health Behavior/Discharge Planning: Goal: Identification of resources available to assist in meeting health care needs will improve Outcome: Progressing Goal: Compliance with treatment plan for underlying cause of condition will improve Outcome: Progressing   Problem: Physical Regulation: Goal: Ability to maintain clinical measurements within normal limits will improve Outcome: Progressing   Problem: Safety: Goal: Periods of time without injury will increase Outcome: Progressing   Problem: Coping: Goal: Coping ability will improve Outcome: Progressing Goal: Will verbalize feelings Outcome: Progressing   Problem: Health Behavior/Discharge Planning: Goal: Compliance with prescribed medication regimen will improve Outcome: Progressing   Problem: Nutritional: Goal: Ability to achieve adequate nutritional intake will improve Outcome: Progressing   Problem: Role Relationship: Goal: Ability to communicate needs accurately will improve Outcome: Progressing Goal: Ability to interact with others will improve Outcome: Progressing   Problem: Safety: Goal: Ability to redirect hostility and anger into socially appropriate behaviors will improve Outcome: Progressing Goal: Ability to remain free from injury will improve Outcome: Progressing   Problem: Coping: Goal: Ability to identify and develop effective coping behavior will improve Outcome: Progressing   Problem: Health Behavior/Discharge Planning: Goal: Identification of resources available to assist in meeting health care needs will  improve Outcome: Progressing   Problem: Self-Concept: Goal: Will verbalize positive feelings about self Outcome: Progressing

## 2023-09-30 NOTE — Group Note (Signed)
Date:  09/30/2023 Time:  11:59 AM  Group Topic/Focus:  Orientation:   The focus of this group is to educate the patient on the purpose and policies of crisis stabilization and provide a format to answer questions about their admission.  The group details unit policies and expectations of patients while admitted.    Participation Level:  Minimal  Participation Quality:  Attentive  Affect:  Flat  Cognitive:  Appropriate  Insight: Good  Engagement in Group:  Limited  Modes of Intervention:  Discussion  Additional Comments:     Reymundo Poll 09/30/2023, 11:59 AM

## 2023-09-30 NOTE — Plan of Care (Signed)
Nurse discussed anxiety, depression and coping skills with patient.  

## 2023-09-30 NOTE — Group Note (Signed)
LCSW Group Therapy Note   Group Date: 09/30/2023 Start Time: 1100 End Time: 1200   Type of Therapy and Topic:  Group Therapy: Challenging Core Beliefs  Participation Level:  Minimal  Description of Group:  Patients were educated about core beliefs and asked to identify one harmful core belief that they have. Patients were asked to explore from where those beliefs originate. Patients were asked to discuss how those beliefs make them feel and the resulting behaviors of those beliefs. They were then be asked if those beliefs are true and, if so, what evidence they have to support them. Lastly, group members were challenged to replace those negative core beliefs with helpful beliefs.   Therapeutic Goals:   1. Patient will identify harmful core beliefs and explore the origins of such beliefs. 2. Patient will identify feelings and behaviors that result from those core beliefs. 3. Patient will discuss whether such beliefs are true. 4.  Patient will replace harmful core beliefs with helpful ones.  Summary of Patient Progress:  Pt was present in group but did not contribute much when asked questions. Pt was respectful and observed group but gave minimal contributions.  Therapeutic Modalities: Cognitive Behavioral Therapy; Solution-Focused Therapy   Kathi Der, LCSWA 09/30/2023  12:23 PM

## 2023-09-30 NOTE — [facility] Group Notes (Signed)
[facility] Group Notes:  (Nursing/MHT/Case Management/Adjunct)  Date:  09/30/2023  Time:  9:51 PM  Type of Therapy:  Psychoeducational Skills  Participation Level:  Minimal  Participation Quality:  Attentive  Affect:  Appropriate  Cognitive:  Appropriate  Insight:  Limited  Engagement in Group:  Limited  Modes of Intervention:  Education  Summary of Progress/Problems: The patient rated her day as a 7 out of 10 since she had a "decent" day. She did not offer any additional details.   Hazle Coca S 09/30/2023, 9:51 PM

## 2023-09-30 NOTE — Progress Notes (Signed)
D:  Patient's self inventory sheet, patient sleeps good, no sleep medication.  Good appetite, high energy level, good concentration.  Denied depression and hopeless, rated anxiety #3.  Denied withdrawals.  Denied SI.  Denied physical problems.  Denied physical pain.  Goal is attend group activities.  Plans to attend more.  Does have discharge plans. A:  Medications administered per MD orders.  Emotional support and encouragement given patient. R:  Denied SI and HI, contracts for safety.  Denied A/V hallucinations,  Safety maintained with 15  minute checks.

## 2023-10-01 DIAGNOSIS — F203 Undifferentiated schizophrenia: Secondary | ICD-10-CM | POA: Diagnosis not present

## 2023-10-01 NOTE — [facility] Group Notes (Signed)
[facility] Group Notes:  (Nursing/MHT/Case Management/Adjunct)  Date:  10/01/2023  Time:  9:11 PM  Type of Therapy:   Wrap-up group  Participation Level:  Active  Participation Quality:  Appropriate  Affect:  Appropriate  Cognitive:  Appropriate  Insight:  Appropriate  Engagement in Group:  Engaged  Modes of Intervention:  Education  Summary of Progress/Problems: Pt attended [employer].  Noah Delaine 10/01/2023, 9:11 PM

## 2023-10-01 NOTE — Group Note (Signed)
Date:  10/01/2023 Time:  1:15 PM  Group Topic/Focus:  Developing a Wellness Toolbox:   The focus of this group is to help patients develop a "wellness toolbox" with skills and strategies to promote recovery upon discharge.    Participation Level:  Active  Participation Quality:  Appropriate  Affect:  Appropriate  Cognitive:  Alert  Insight: Good  Engagement in Group:  Engaged  Modes of Intervention:  Education  Additional Comments:    Beckie Busing 10/01/2023, 1:15 PM

## 2023-10-01 NOTE — Progress Notes (Signed)
[facility] MD Progress Note  10/01/2023 1:34 PM Sarah Solis  MRN:  [id number]  Subjective:   Sarah Solis is a 22 yo AAF with prior mental health diagnosis of Schizophrenia who presented to the [facility] behavioral health urgent care with complaints of  bizarre behaviors at home as per mother who accompanied her. As per documentation from the [facility]: "Per the patient's mother patient has been acting strange, walking around the house naked going into rooms waking up other family members. According to mom patient has been off her medication for 4 months when asked why the patient off her medicine mom stated that when the patient feels better she would stop taking her medications. According to mom patient was prescribed Risperdal 01 mg during the morning and 3 mg at bedtime." [name], NP, Date of Service: 09/22/2023 10:16 PM).  Pt's last hospitalization at this [facility] was on 08/06/2022. Pt was transferred from the [facility] to this [facility] voluntarily for this hospitalization for treatment and stabilization of her mental status.   Today's patient assessment note: Chart reviewed, pt's case discussed with the treatment team. Pt continues to present with a depressed mood, but objectively, she is less depressed as compared to yesterday, and the days prior.  Her attention to personal hygiene and grooming has also improved and  is currently fair, eye contact is good, speech is clear & coherent. Thought contents are organized and logical, and pt currently denies SI/HI/AVH or paranoia. There is no evidence of delusional thoughts.  She states that she has not heard any voices in a few days.  Pt reports that his sleep quality has improved since hospitalization. She reports a fair appetite, reports a fair energy level, and is visible in the milieu attending group sessions. Pt denies any side effects to her medications so far. Pt's V/S are WNL, pt denies being in any physical distress/pain. He denies TD/EPS symptoms. AIMS  score is 0. Abilify Maintaina 400 mg IM was given yesterday due to history of medication non compliance. We are continuing medications as listed below, and will most likely discharge patient on Sunday or Monday, based on her progress & if safety planning with family and outpatient f/u appts have been completed.   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 Psychiatric medication compliance history:non compliance Neuromodulation history: denies Current Psychiatrist:denies having one Current therapist: denies having one    Past Medical History: History reviewed. No pertinent past medical history.  Past Surgical History:  Procedure Laterality Date   INCISION AND DRAINAGE PERIRECTAL ABSCESS Left 10/16/2020   Procedure: IRRIGATION AND DEBRIDEMENT PERIRECTAL ABSCESS;  Surgeon: Abigail Miyamoto, MD;  Location: [facility] OR;  Service: General;  Laterality: Left;   Family History:  Family History  Problem Relation Age of Onset   Schizophrenia Father    Family Psychiatric  History: See H&P  Social History:  Social History   Substance and Sexual Activity  Alcohol Use Not Currently     Social History   Substance and Sexual Activity  Drug Use Not Currently  Social History   Socioeconomic History   Marital status: Single    Spouse name: Not on file    Number of children: Not on file   Years of education: Not on file   Highest education level: Not on file  Occupational History   Not on file  Tobacco Use   Smoking status: Never   Smokeless tobacco: Never  Vaping Use   Vaping status: Some Days  Substance and Sexual Activity   Alcohol use: Not Currently   Drug use: Not Currently   Sexual activity: Not Currently  Other Topics Concern   Not on file  Social History Narrative   Not currently working but looking to return to work.    Lives with mother, mother's husband, 2 sisters, and brother.   Social Determinants of Health   Financial Resource Strain: Medium Risk (08/31/2022)   Overall Financial Resource Strain (CARDIA)    Difficulty of Paying Living Expenses: Somewhat hard  Food Insecurity: No Food Insecurity (09/23/2023)   Hunger Vital Sign    Worried About Running Out of Food in the Last Year: Never true    Ran Out of Food in the Last Year: Never true  Transportation Needs: Unmet Transportation Needs (09/23/2023)   PRAPARE - Administrator, Civil Service (Medical): Yes    Lack of Transportation (Non-Medical): Yes  Physical Activity: Inactive (08/31/2022)   Exercise Vital Sign    Days of Exercise per Week: 0 days    Minutes of Exercise per Session: 0 min  Stress: Stress Concern Present (08/31/2022)   [employer] of Occupational Health - Occupational Stress Questionnaire    Feeling of Stress : To some extent  Social Connections: Socially Isolated (08/31/2022)   Social Connection and Isolation Panel [NHANES]    Frequency of Communication with Friends and Family: Never    Frequency of Social Gatherings with Friends and Family: Never    Attends Religious Services: 1 to 4 times per year    Active Member of [employer] or Organizations: No    Attends Engineer, structural: Never    Marital Status: Never married  Current Medications: Current Facility-Administered Medications  Medication Dose Route Frequency  Provider Last Rate Last Admin   acetaminophen (TYLENOL) tablet 650 mg  650 mg Oral Q6H PRN White, Patrice L, NP       alum & mag hydroxide-simeth (MAALOX/MYLANTA) 200-200-20 MG/5ML suspension 30 mL  30 mL Oral Q4H PRN White, Patrice L, NP       ARIPiprazole (ABILIFY) tablet 20 mg  20 mg Oral Daily Massengill, Harrold Donath, MD   20 mg at 10/01/23 0759   ARIPiprazole ER (ABILIFY MAINTENA) 400 MG prefilled syringe 400 mg  400 mg Intramuscular Q28 days Massengill, Harrold Donath, MD   400 mg at 09/30/23 1200   diphenhydrAMINE (BENADRYL) capsule 50 mg  50 mg Oral TID PRN White, Patrice L, NP       Or   diphenhydrAMINE (BENADRYL) injection 50 mg  50 mg Intramuscular TID PRN White, Patrice L, NP       FLUoxetine (PROZAC) capsule 20 mg  20 mg Oral Daily Massengill, Nathan, MD   20 mg at 10/01/23 0759   haloperidol (HALDOL) tablet 5 mg  5 mg Oral TID PRN White, Patrice L, NP       Or   haloperidol lactate (HALDOL) injection 5 mg  5 mg Intramuscular TID PRN White, Patrice L, NP       hydrOXYzine (ATARAX) tablet 25 mg  25  mg Oral TID PRN Liborio Nixon L, NP   25 mg at 10/01/23 1200   influenza vac split trivalent PF (FLULAVAL) injection 0.5 mL  0.5 mL Intramuscular Tomorrow-1000 Massengill, Harrold Donath, MD       LORazepam (ATIVAN) tablet 2 mg  2 mg Oral TID PRN White, Patrice L, NP       Or   LORazepam (ATIVAN) injection 2 mg  2 mg Intramuscular TID PRN White, Patrice L, NP       magnesium hydroxide (MILK OF MAGNESIA) suspension 30 mL  30 mL Oral Daily PRN White, Patrice L, NP       metFORMIN (GLUCOPHAGE-XR) 24 hr tablet 500 mg  500 mg Oral Q breakfast Adi Doro, NP   500 mg at 10/01/23 0759   propranolol (INDERAL) tablet 10 mg  10 mg Oral Q12H Massengill, Harrold Donath, MD   10 mg at 10/01/23 0759   Vitamin D (Ergocalciferol) (DRISDOL) 1.25 MG (50000 UNIT) capsule 50,000 Units  50,000 Units Oral Q7 days Starleen Blue, NP   50,000 Units at 09/28/23 1711   Lab Results:  No results found for this or any previous visit (from  the past 48 hour(s)).   Blood Alcohol level:  Lab Results  Component Value Date   ETH <10 09/23/2023   ETH <10 08/17/2022   Metabolic Disorder Labs: Lab Results  Component Value Date   HGBA1C 5.6 09/23/2023   MPG 114.02 09/23/2023   MPG 99.67 08/04/2022   No results found for: "PROLACTIN" Lab Results  Component Value Date   CHOL 117 09/23/2023   TRIG 45 09/23/2023   HDL 34 (L) 09/23/2023   CHOLHDL 3.4 09/23/2023   VLDL 9 09/23/2023   LDLCALC 74 09/23/2023   LDLCALC 103 (H) 08/04/2022   Physical Findings: AIMS: 0 CIWA: n/a COWS:  n/a  Psychiatric Specialty Exam:  Presentation  General Appearance:  Fairly Groomed  Eye Contact: Fair  Speech: Clear and Coherent  Speech Volume: Normal  Handedness: Right   Mood and Affect  Mood: Depressed  Affect: Congruent   Thought Process  Thought Processes: Coherent  Descriptions of Associations:Intact  Orientation:Full (Time, Place and Person)  Thought Content:Logical  History of Schizophrenia/Schizoaffective disorder:Yes  Duration of Psychotic Symptoms:Greater than six months  Hallucinations:Hallucinations: None   Ideas of Reference:None  Suicidal Thoughts:Suicidal Thoughts: No   Homicidal Thoughts:Homicidal Thoughts: No    Sensorium  Memory: Immediate Fair  Judgment: Fair  Insight: Fair   Art therapist  Concentration: Fair  Attention Span: Fair  Recall: [employer] of Knowledge: Fair  Language: Fair   Psychomotor Activity  Psychomotor Activity: Psychomotor Activity: Normal  Assets  Assets: Resilience; Social Support   Sleep  Sleep: Sleep: Good  Physical Exam: Physical Exam Vitals reviewed.  Constitutional:      General: She is not in acute distress.    Appearance: She is not toxic-appearing.  Pulmonary:     Effort: Pulmonary effort is normal.  Neurological:     Mental Status: She is alert.     Motor: No weakness.     Gait: Gait normal.   Psychiatric:        Behavior: Behavior normal.    Review of Systems  Constitutional:  Negative for chills and fever.  Cardiovascular:  Negative for chest pain and palpitations.  Neurological:  Negative for dizziness, tingling, tremors and headaches.  Psychiatric/Behavioral:  Positive for depression and hallucinations. Negative for memory loss, substance abuse and suicidal ideas. The patient is nervous/anxious. The patient does not  have insomnia.   All other systems reviewed and are negative.  Blood pressure 121/60, pulse 100, temperature 97.6 F (36.4 C), temperature source Oral, resp. rate 18, height 5\' 6"  (1.676 m), weight 92.1 kg, SpO2 100%. Body mass index is 32.77 kg/m.   Treatment Plan Summary: Daily contact with patient to assess and evaluate symptoms and progress in treatment and Medication management  ASSESSMENT:  Diagnoses / Active Problems: Schizophrenia versus schizoaffective disorder depressive type  PLAN: Safety and Monitoring:  --  Voluntary admission to inpatient psychiatric unit for safety, stabilization and treatment  -- Daily contact with patient to assess and evaluate symptoms and progress in treatment  -- Patient's case to be discussed in multi-disciplinary team meeting  -- Observation Level : q15 minute checks  -- Vital signs:  q12 hours  -- Precautions: suicide, elopement, and assault  2. Psychiatric Diagnoses and Treatment:  -Given Abilify 400 mg IM on 10/24 for mood stabilization  -Continue Prozac 20 mg once daily for depression -Continue Abilify 20 mg once daily for psychosis/mood stabilization.  - Continue oral Abilify for 10 to 14 days after 10/24 2024 -Continue metformin 500 mg daily to mitigate weight gain from antipsychotics  -Continue Inderal 10 mg BID for anxiety/tachycardia  -Continue Hydroxyzine 25 mg TID PRN for anxiety -Continue Agitation protocol medication: Benadryl/Haldol/Ativan PRN   -Previously discontinued on admission Risperdal  due to concerns for weight gain -Previously discontinued on admission Remeron due to weight gain  --  The risks/benefits/side-effects/alternatives to this medication were discussed in detail with the patient and time was given for questions. The patient consents to medication trial.    -- Metabolic profile and EKG monitoring obtained while on an atypical antipsychotic (BMI: Lipid Panel: HbgA1c: QTc:)   -- Encouraged patient to participate in unit milieu and in scheduled group therapies    3. Medical Issues Being Addressed:  Labs Pending: Vitamin D low at 19.51,supplementing with 50.000 units. CBC WNL.   4. Discharge Planning:   -- Social work and case management to assist with discharge planning and identification of hospital follow-up needs prior to discharge  -- Estimated LOS: 2-3 more days  -- Discharge Concerns: Need to establish a safety plan; Medication compliance and effectiveness  -- Discharge Goals: Return home with outpatient referrals for mental health follow-up including medication management/psychotherapy  Starleen Blue, NP 10/01/2023, 1:34 PM  Total Time Spent in Direct Patient Care:  I personally spent 35 minutes on the unit in direct patient care. The direct patient care time included face-to-face time with the patient, reviewing the patient's chart, communicating with other professionals, and coordinating care. Greater than 50% of this time was spent in counseling or coordinating care with the patient regarding goals of hospitalization, psycho-education, and discharge planning needs.   Patient ID: Sarah Solis, female   DOB: [birth year], 22 y.o.   MRN: [id number]

## 2023-10-01 NOTE — Group Note (Signed)
Date:  10/01/2023 Time:  9:17 AM  Group Topic/Focus:  Orientation:   The focus of this group is to educate the patient on the purpose and policies of crisis stabilization and provide a format to answer questions about their admission.  The group details unit policies and expectations of patients while admitted.    Participation Level:  Active  Participation Quality:  Appropriate  Affect:  Appropriate  Cognitive:  Appropriate  Insight: Appropriate and Good  Engagement in Group:  Engaged  Modes of Intervention:  Orientation  Additional Comments:    Beckie Busing 10/01/2023, 9:17 AM

## 2023-10-01 NOTE — Progress Notes (Signed)
   10/01/23 0822  Psych Admission Type (Psych Patients Only)  Admission Status Voluntary  Psychosocial Assessment  Patient Complaints Anxiety  Eye Contact Brief  Facial Expression Animated;Anxious  Affect Anxious;Sad  Speech Soft  Interaction Minimal  Motor Activity Other (Comment) (Q 15 minute observation)  Appearance/Hygiene Unremarkable  Behavior Characteristics Cooperative  Mood Anxious  Thought Process  Coherency WDL  Content WDL  Delusions None reported or observed  Perception WDL  Hallucination None reported or observed  Judgment Limited  Confusion None  Danger to Self  Current suicidal ideation? Denies  Agreement Not to Harm Self Yes  Description of Agreement Verbal  Danger to Others  Danger to Others None reported or observed

## 2023-10-01 NOTE — Group Note (Signed)
Recreation Therapy Group Note   Group Topic:Team Building  Group Date: 10/01/2023 Start Time: 0935 End Time: 0955 Facilitators: Hammond Obeirne-McCall, LRT,CTRS Location: [address] Dayroom   Group Topic: Team Building, Problem Solving  Goal Area(s) Addresses:  Patient will effectively work with peer towards shared goal.  Patient will identify skills used to make activity successful.  Patient will identify how skills used during activity can be applied to reach post d/c goals.   Intervention: STEM Activity- Glass blower/designer  Group Description: Tallest Pharmacist, community. In teams of 5-6, patients were given 11 craft pipe cleaners. Using the materials provided, patients were instructed to compete again the opposing team(s) to build the tallest free-standing structure from floor level. The activity was timed; difficulty increased by Clinical research associate as Production designer, theatre/television/film continued.  Systematically resources were removed with additional directions for example, placing one arm behind their back, working in silence, and shape stipulations. LRT facilitated post-activity discussion reviewing team processes and necessary communication skills involved in completion. Patients were encouraged to reflect how the skills utilized, or not utilized, in this activity can be incorporated to positively impact support systems post discharge.  Education: Pharmacist, community, Scientist, physiological, Discharge Planning   Education Outcome: Acknowledges education/In group clarification offered/Needs additional education.    Affect/Mood: Appropriate   Participation Level: Engaged   Participation Quality: Independent   Behavior: Appropriate   Speech/Thought Process: Focused   Insight: Good   Judgement: Good   Modes of Intervention: STEM Activity   Patient Response to Interventions:  Engaged   Education Outcome:  In group clarification offered    Clinical Observations/Individualized Feedback: Pt attended and  participated in group session.      Plan: Continue to engage patient in RT group sessions 2-3x/week.   Kohner Orlick-McCall, LRT,CTRS 10/01/2023 12:11 PM

## 2023-10-01 NOTE — Plan of Care (Signed)
  Problem: Activity: Goal: Interest or engagement in activities will improve Outcome: Progressing Goal: Sleeping patterns will improve Outcome: Progressing   Problem: Safety: Goal: Periods of time without injury will increase Outcome: Progressing   Problem: Coping: Goal: Ability to verbalize frustrations and anger appropriately will improve Outcome: Progressing Goal: Ability to demonstrate self-control will improve Outcome: Progressing   Problem: Coping: Goal: Ability to identify and develop effective coping behavior will improve Outcome: Progressing

## 2023-10-01 NOTE — Progress Notes (Signed)
Pt is alert and oreinted X4 at this time. She reports anxiety of a 1/10 no  depression at this time. Pt denies pain ,  SI/HI/AVH at this time.   Pt received scheduled medications per MD orders. Support and encouragement provided. Frequent verbal contracts made.Routine safety checks conducted  q15 mins.  No adverse drug reactions noted. Pt verbally contacts for safety at this time. . Pt is compliant with medications and treatment plan. Pt interacts well with others on the unit. Pt remains safe at this time. Plan of care ongoing.

## 2023-10-01 NOTE — Group Note (Signed)
Date:  10/01/2023 Time:  12:59 PM  Group Topic/Focus:  Goals Group:   The focus of this group is to help patients establish daily goals to achieve during treatment and discuss how the patient can incorporate goal setting into their daily lives to aide in recovery.    Participation Level:  Active  Participation Quality:  Appropriate  Affect:  Appropriate  Cognitive:  Alert  Insight: Good  Engagement in Group:  Engaged  Modes of Intervention:  Discussion  Additional Comments:    Beckie Busing 10/01/2023, 12:59 PM

## 2023-10-02 DIAGNOSIS — F203 Undifferentiated schizophrenia: Secondary | ICD-10-CM | POA: Diagnosis not present

## 2023-10-02 MED ORDER — ARIPIPRAZOLE 20 MG PO TABS: 20.0000 mg | ORAL_TABLET | Freq: Every day | ORAL | 0 refills | Status: DC

## 2023-10-02 MED ORDER — FLUOXETINE HCL 20 MG PO CAPS: 20.0000 mg | ORAL_CAPSULE | Freq: Every day | ORAL | 0 refills | Status: DC

## 2023-10-02 MED ORDER — ARIPIPRAZOLE ER 400 MG IM PRSY: 400.0000 mg | PREFILLED_SYRINGE | INTRAMUSCULAR | 0 refills | Status: DC

## 2023-10-02 MED ORDER — METFORMIN HCL ER 500 MG PO TB24: 500.0000 mg | ORAL_TABLET | Freq: Every day | ORAL | 0 refills | Status: DC

## 2023-10-02 MED ORDER — VITAMIN D (ERGOCALCIFEROL) 1.25 MG (50000 UNIT) PO CAPS: 50000.0000 [IU] | ORAL_CAPSULE | ORAL | 0 refills | Status: DC

## 2023-10-02 MED ORDER — PROPRANOLOL HCL 10 MG PO TABS: 10.0000 mg | ORAL_TABLET | Freq: Two times a day (BID) | ORAL | 0 refills | Status: DC

## 2023-10-02 NOTE — Progress Notes (Signed)
Patient ID: Sarah Solis, female   DOB: 02-Oct-2001, 22 y.o.   MRN: [id number]    Pt ambulatory, alert, and oriented X4 on and off the unit. Education, support, and encouragement provided. Discharge summary/AVS, prescriptions, medications, and follow up appointments reviewed with pt and a copy of the AVS was given to pt. Medications "next dose" was also reviewed with pt and marked/notated on pt's med list on AVS. Pt declined to complete Suicide Safety Plan. Suicide prevention resources were provided. Pt's belongings in locker #7 returned and belongings sheet signed. Pt denies SI/HI (plan and intention), and AVH. Pt denies any concerns at this time. Pt discharged to lobby.

## 2023-10-02 NOTE — Progress Notes (Signed)
  [facility] Adult Case Management Discharge Plan :  Will you be returning to the same living situation after discharge:  Yes,  With Parent At discharge, do you have transportation home?: Yes,  Parent Do you have the ability to pay for your medications: Yes,  Pt has medical coverage  Release of information consent forms completed and in the chart;  Patient's signature needed at discharge.  Patient to Follow up at:  Follow-up Information     [facility] Follow up on 10/13/2023.   Specialty: Behavioral Health Why: You have an appointment for medication management services on 10/13/23 at 10:30 am, Virtual appt.  You also have an appointment for therapy services on 11/24/23 at 3:00 pm, Virtual appt.  * YOU MUST CONFIRM ALL APPTS OR THE APPT WILL HAVE TO BE CANCELLED Contact information: [phone] [place] [address] [phone]                Next level of care provider has access to [facility] Link:yes  Safety Planning and Suicide Prevention discussed: Yes,  Parent     Has patient been referred to the Quitline?: Patient does not use tobacco/nicotine products  Patient has been referred for addiction treatment: No known substance use disorder.  [name], LCSW 10/02/2023, 9:29 AM

## 2023-10-02 NOTE — Discharge Summary (Signed)
Physician Discharge Summary Note  Patient:  Sarah Solis is an 22 y.o., female MRN:  [id number] DOB:  2000-12-27 Patient phone:  [phone] (home)  Patient address:   [address] [place] [address],  Total Time spent with patient: 45 minutes  Date of Admission:  09/23/2023 Date of Discharge: 10/02/2023  Reason for Admission:  Sarah Solis is a 22 yo AAF with prior mental health diagnosis of Schizophrenia who presented to the [facility] health urgent care with complaints of  bizarre behaviors at home as per mother who accompanied her. As per documentation from the [facility]: "Per the patient's mother patient has been acting strange, walking around the house naked going into rooms waking up other family members. According to mom patient has been off her medication for 4 months when asked why the patient off her medicine mom stated that when the patient feels better she would stop taking her medications. According to mom patient was prescribed Risperdal 01 mg during the morning and 3 mg at bedtime." [name], NP, Date of Service: 09/22/2023 10:16 PM).  Pt's last hospitalization at this [facility] was on 08/06/2022. Pt was transferred from the [facility] to this [facility] voluntarily for this hospitalization for treatment and stabilization of her mental status.   Principal Problem: Undifferentiated schizophrenia [facility]) Discharge Diagnoses: Principal Problem:   Undifferentiated schizophrenia (HCC) Active Problems:   Insomnia   Anxiety   Paroxysmal ventricular tachycardia (HCC)   Obesity   Past Psychiatric History: see H&P  Past Medical History: History reviewed. No pertinent past medical history.  Past Surgical History:  Procedure Laterality Date   INCISION AND DRAINAGE PERIRECTAL ABSCESS Left 10/16/2020   Procedure: IRRIGATION AND DEBRIDEMENT PERIRECTAL ABSCESS;  Surgeon: Abigail Miyamoto, MD;  Location: [facility] OR;  Service: General;  Laterality: Left;   Family History:  Family  History  Problem Relation Age of Onset   Schizophrenia Father    Family Psychiatric  History: see H&P Social History:  Social History   Substance and Sexual Activity  Alcohol Use Not Currently     Social History   Substance and Sexual Activity  Drug Use Not Currently    Social History   Socioeconomic History   Marital status: Single    Spouse name: Not on file   Number of children: Not on file   Years of education: Not on file   Highest education level: Not on file  Occupational History   Not on file  Tobacco Use   Smoking status: Never   Smokeless tobacco: Never  Vaping Use   Vaping status: Some Days  Substance and Sexual Activity   Alcohol use: Not Currently   Drug use: Not Currently   Sexual activity: Not Currently  Other Topics Concern   Not on file  Social History Narrative   Not currently working but looking to return to work.    Lives with mother, mother's husband, 2 sisters, and brother.   Social Determinants of Health   Financial Resource Strain: Medium Risk (08/31/2022)   Overall Financial Resource Strain (CARDIA)    Difficulty of Paying Living Expenses: Somewhat hard  Food Insecurity: No Food Insecurity (09/23/2023)   Hunger Vital Sign    Worried About Running Out of Food in the Last Year: Never true    Ran Out of Food in the Last Year: Never true  Transportation Needs: Unmet Transportation Needs (09/23/2023)   PRAPARE - Administrator, Civil Service (Medical): Yes    Lack of Transportation (Non-Medical):  Yes  Physical Activity: Inactive (08/31/2022)   Exercise Vital Sign    Days of Exercise per Week: 0 days    Minutes of Exercise per Session: 0 min  Stress: Stress Concern Present (08/31/2022)   [employer] of Occupational Health - Occupational Stress Questionnaire    Feeling of Stress : To some extent  Social Connections: Socially Isolated (08/31/2022)   Social Connection and Isolation Panel [NHANES]    Frequency of Communication  with Friends and Family: Never    Frequency of Social Gatherings with Friends and Family: Never    Attends Religious Services: 1 to 4 times per year    Active Member of [employer] or Organizations: No    Attends Banker Meetings: Never    Marital Status: Never married    Hospital Course:  Chart reviewed, pt's case discussed with the treatment team. Pt's condition gradually improved throughout hospital course. Pt continues to present with a depressed mood, but objectively, she is less depressed as compared to yesterday, and the days prior.  Her attention to personal hygiene and grooming has also improved and  is currently fair, eye contact is good, speech is clear & coherent. Thought contents are organized and logical, and pt currently denies SI/HI/AVH or paranoia. There is no evidence of delusional thoughts.  She states that she has not heard any voices in a few days.   Pt reports that his sleep quality has improved since hospitalization. She reports a fair appetite, reports a fair energy level, and is visible in the milieu attending group sessions. Pt denies any side effects to her medications so far. Pt's V/S are WNL, pt denies being in any physical distress/pain. He denies TD/EPS symptoms. AIMS score is 0. Abilify Maintaina 400 mg IM was given on 09/30/23 due to history of medication non compliance, so pt should continue oral abilify for 9 more days.   Physical Findings: AIMS:  , ,  ,  ,    CIWA:    COWS:     Musculoskeletal: Strength & Muscle Tone: within normal limits Gait & Station: normal Patient leans: N/A   Psychiatric Specialty Exam:  Presentation  General Appearance:  Fairly Groomed  Eye Contact: Fair  Speech: Clear and Coherent  Speech Volume: Normal  Handedness: Right   Mood and Affect  Mood: Depressed  Affect: Congruent   Thought Process  Thought Processes: Coherent  Descriptions of Associations:Intact  Orientation:Full (Time, Place and  Person)  Thought Content:Logical  History of Schizophrenia/Schizoaffective disorder:Yes  Duration of Psychotic Symptoms:Greater than six months  Hallucinations:Hallucinations: None  Ideas of Reference:None  Suicidal Thoughts:Suicidal Thoughts: No  Homicidal Thoughts:Homicidal Thoughts: No   Sensorium  Memory: Immediate Fair  Judgment: Fair  Insight: Fair   Art therapist  Concentration: Fair  Attention Span: Fair  Recall: Fair  Fund of Knowledge: Fair  Language: Fair   Psychomotor Activity  Psychomotor Activity: Psychomotor Activity: Normal   Assets  Assets: Resilience; Social Support   Sleep  Sleep: Sleep: Good    Physical Exam: Physical Exam Vitals and nursing note reviewed.  Constitutional:      Appearance: Normal appearance.  HENT:     Head: Normocephalic and atraumatic.     Nose: Nose normal.     Mouth/Throat:     Mouth: Mucous membranes are moist.  Eyes:     Pupils: Pupils are equal, round, and reactive to light.  Cardiovascular:     Rate and Rhythm: Normal rate and regular rhythm.  Pulses: Normal pulses.     Heart sounds: Normal heart sounds.  Pulmonary:     Effort: Pulmonary effort is normal.     Breath sounds: Normal breath sounds.  Musculoskeletal:        General: Normal range of motion.     Cervical back: Normal range of motion and neck supple.  Skin:    General: Skin is warm and dry.  Neurological:     General: No focal deficit present.     Mental Status: She is alert.    Review of Systems  Constitutional: Negative.   All other systems reviewed and are negative.  Blood pressure 109/88, pulse (!) 103, temperature 98.3 F (36.8 C), temperature source Oral, resp. rate 20, height 5\' 6"  (1.676 m), weight 92.1 kg, SpO2 99%. Body mass index is 32.77 kg/m.   Social History   Tobacco Use  Smoking Status Never  Smokeless Tobacco Never   Tobacco Cessation:  N/A, patient does not currently use tobacco  products   Blood Alcohol level:  Lab Results  Component Value Date   ETH <10 09/23/2023   ETH <10 08/17/2022    Metabolic Disorder Labs:  Lab Results  Component Value Date   HGBA1C 5.6 09/23/2023   MPG 114.02 09/23/2023   MPG 99.67 08/04/2022   No results found for: "PROLACTIN" Lab Results  Component Value Date   CHOL 117 09/23/2023   TRIG 45 09/23/2023   HDL 34 (L) 09/23/2023   CHOLHDL 3.4 09/23/2023   VLDL 9 09/23/2023   LDLCALC 74 09/23/2023   LDLCALC 103 (H) 08/04/2022    See Psychiatric Specialty Exam and Suicide Risk Assessment completed by Attending Physician prior to discharge.  Discharge destination:  Home  Is patient on multiple antipsychotic therapies at discharge:  No   Has Patient had three or more failed trials of antipsychotic monotherapy by history:  No  Recommended Plan for Multiple Antipsychotic Therapies: NA  Discharge Instructions     Diet - low sodium heart healthy   Complete by: As directed    Discharge instructions   Complete by: As directed    Follow up as scheduled   Increase activity slowly   Complete by: As directed       Allergies as of 10/02/2023   No Known Allergies      Medication List     STOP taking these medications    mirtazapine 15 MG tablet Commonly known as: REMERON   risperiDONE 0.5 MG tablet Commonly known as: RISPERDAL   Vitamin D2 10 MCG (400 UNIT) Tabs Replaced by: Vitamin D (Ergocalciferol) 1.25 MG (50000 UNIT) Caps capsule       TAKE these medications      Indication  ARIPiprazole 20 MG tablet Commonly known as: ABILIFY Take 1 tablet (20 mg total) by mouth daily. Start taking on: October 03, 2023  Indication: psychosis   ARIPiprazole ER 400 MG Prsy prefilled syringe Commonly known as: ABILIFY MAINTENA Inject 400 mg into the muscle every 28 (twenty-eight) days. Start taking on: October 28, 2023  Indication: Schizophrenia   FLUoxetine 20 MG capsule Commonly known as: PROZAC Take 1  capsule (20 mg total) by mouth daily. Start taking on: October 03, 2023  Indication: Depression   metFORMIN 500 MG 24 hr tablet Commonly known as: GLUCOPHAGE-XR Take 1 tablet (500 mg total) by mouth daily with breakfast. Start taking on: October 03, 2023  Indication: Body Weight Gain due to Antipsychotic Medication Use   propranolol 10 MG tablet Commonly  known as: INDERAL Take 1 tablet (10 mg total) by mouth every 12 (twelve) hours.  Indication: Feeling Anxious   Vitamin D (Ergocalciferol) 1.25 MG (50000 UNIT) Caps capsule Commonly known as: DRISDOL Take 1 capsule (50,000 Units total) by mouth every 7 (seven) days. Start taking on: October 05, 2023 Replaces: Vitamin D2 10 MCG (400 UNIT) Tabs  Indication: Vitamin D Deficiency        Follow-up Information     [facility] Follow up on 10/13/2023.   Specialty: Behavioral Health Why: You have an appointment for medication management services on 10/13/23 at 10:30 am, Virtual appt.  You also have an appointment for therapy services on 11/24/23 at 3:00 pm, Virtual appt.  * YOU MUST CONFIRM ALL APPTS OR THE APPT WILL HAVE TO BE CANCELLED Contact information: [address] [place] [address] [phone]                Follow-up recommendations:  Activity:  as tolerated Diet:  low cholesterol/salt/sugar Tests:  per PCP Other:  f/u as scheduled  Comments:  Pt feel safe for discharge today, and states her mother plans to pick her up. Pt denies SI/HI/AVH.  SignedAncil Linsey, MD 10/02/2023, 10:06 AM

## 2023-10-02 NOTE — Plan of Care (Signed)
  Problem: Education: Goal: Knowledge of [facility] Education information/materials will improve Outcome: Progressing Goal: Emotional status will improve Outcome: Progressing Goal: Mental status will improve Outcome: Progressing Goal: Verbalization of understanding the information provided will improve Outcome: Progressing   Problem: Activity: Goal: Interest or engagement in activities will improve Outcome: Progressing Goal: Sleeping patterns will improve Outcome: Progressing   Problem: Coping: Goal: Ability to verbalize frustrations and anger appropriately will improve Outcome: Progressing Goal: Ability to demonstrate self-control will improve Outcome: Progressing   Problem: Health Behavior/Discharge Planning: Goal: Identification of resources available to assist in meeting health care needs will improve Outcome: Progressing Goal: Compliance with treatment plan for underlying cause of condition will improve Outcome: Progressing   Problem: Physical Regulation: Goal: Ability to maintain clinical measurements within normal limits will improve Outcome: Progressing   Problem: Safety: Goal: Periods of time without injury will increase Outcome: Progressing   Problem: Education: Goal: Knowledge of [facility] Education information/materials will improve Outcome: Progressing Goal: Emotional status will improve Outcome: Progressing Goal: Mental status will improve Outcome: Progressing Goal: Verbalization of understanding the information provided will improve Outcome: Progressing   Problem: Activity: Goal: Interest or engagement in activities will improve Outcome: Progressing Goal: Sleeping patterns will improve Outcome: Progressing   Problem: Coping: Goal: Ability to verbalize frustrations and anger appropriately will improve Outcome: Progressing Goal: Ability to demonstrate self-control will improve Outcome: Progressing   Problem: Health  Behavior/Discharge Planning: Goal: Identification of resources available to assist in meeting health care needs will improve Outcome: Progressing Goal: Compliance with treatment plan for underlying cause of condition will improve Outcome: Progressing   Problem: Physical Regulation: Goal: Ability to maintain clinical measurements within normal limits will improve Outcome: Progressing   Problem: Safety: Goal: Periods of time without injury will increase Outcome: Progressing   Problem: Coping: Goal: Coping ability will improve Outcome: Progressing Goal: Will verbalize feelings Outcome: Progressing   Problem: Health Behavior/Discharge Planning: Goal: Compliance with prescribed medication regimen will improve Outcome: Progressing   Problem: Nutritional: Goal: Ability to achieve adequate nutritional intake will improve Outcome: Progressing   Problem: Role Relationship: Goal: Ability to communicate needs accurately will improve Outcome: Progressing Goal: Ability to interact with others will improve Outcome: Progressing   Problem: Safety: Goal: Ability to redirect hostility and anger into socially appropriate behaviors will improve Outcome: Progressing Goal: Ability to remain free from injury will improve Outcome: Progressing   Problem: Coping: Goal: Ability to identify and develop effective coping behavior will improve Outcome: Progressing   Problem: Health Behavior/Discharge Planning: Goal: Identification of resources available to assist in meeting health care needs will improve Outcome: Progressing   Problem: Self-Concept: Goal: Will verbalize positive feelings about self Outcome: Progressing

## 2023-10-02 NOTE — BHH Suicide Risk Assessment (Signed)
[facility] Discharge Suicide Risk Assessment   Principal Problem: Undifferentiated schizophrenia (HCC) Discharge Diagnoses: Principal Problem:   Undifferentiated schizophrenia (HCC) Active Problems:   Insomnia   Anxiety   Paroxysmal ventricular tachycardia (HCC)   Obesity   Total Time spent with patient: 45 minutes  Musculoskeletal: Strength & Muscle Tone: within normal limits Gait & Station: normal Patient leans: N/A  Psychiatric Specialty Exam  Presentation  General Appearance:  Fairly Groomed  Eye Contact: Fair  Speech: Clear and Coherent  Speech Volume: Normal  Handedness: Right   Mood and Affect  Mood: Depressed  Duration of Depression Symptoms: Greater than two weeks  Affect: Congruent   Thought Process  Thought Processes: Coherent  Descriptions of Associations:Intact  Orientation:Full (Time, Place and Person)  Thought Content:Logical  History of Schizophrenia/Schizoaffective disorder:Yes  Duration of Psychotic Symptoms:Greater than six months  Hallucinations:Hallucinations: None  Ideas of Reference:None  Suicidal Thoughts:Suicidal Thoughts: No  Homicidal Thoughts:Homicidal Thoughts: No   Sensorium  Memory: Immediate Fair  Judgment: Fair  Insight: Fair   Art therapist  Concentration: Fair  Attention Span: Fair  Recall: Fair  Fund of Knowledge: Fair  Language: Fair   Psychomotor Activity  Psychomotor Activity: Psychomotor Activity: Normal   Assets  Assets: Resilience; Social Support   Sleep  Sleep: Sleep: Good   Physical Exam: Physical Exam ROS Blood pressure 109/88, pulse (!) 103, temperature 98.3 F (36.8 C), temperature source Oral, resp. rate 20, height 5\' 6"  (1.676 m), weight 92.1 kg, SpO2 99%. Body mass index is 32.77 kg/m.  Mental Status Per Nursing Assessment::   On Admission:  NA  Demographic Factors:  Low socioeconomic status  Loss Factors: Financial problems/change in  socioeconomic status  Historical Factors: Impulsivity  Risk Reduction Factors:   Sense of responsibility to family and Living with another person, especially a relative  Continued Clinical Symptoms:  Schizophrenia:   Less than 29 years old Paranoid or undifferentiated type  Cognitive Features That Contribute To Risk:  Thought constriction (tunnel vision)    Suicide Risk:  Mild:  Suicidal ideation of limited frequency, intensity, duration, and specificity.  There are no identifiable plans, no associated intent, mild dysphoria and related symptoms, good self-control (both objective and subjective assessment), few other risk factors, and identifiable protective factors, including available and accessible social support.   Follow-up Information     [facility] Follow up on 10/13/2023.   Specialty: Behavioral Health Why: You have an appointment for medication management services on 10/13/23 at 10:30 am, Virtual appt.  You also have an appointment for therapy services on 11/24/23 at 3:00 pm, Virtual appt.  * YOU MUST CONFIRM ALL APPTS OR THE APPT WILL HAVE TO BE CANCELLED Contact information: [address] [place] [address] [phone]                Plan Of Care/Follow-up recommendations:  Activity:  as tolerated Diet:  low cholesterol/salt/sugar Tests:  per PCP Other:  f/u as scheduled  Ancil Linsey, MD 10/02/2023, 9:58 AM

## 2023-10-02 NOTE — Progress Notes (Signed)
   10/02/23 0630  15 Minute Checks  Location Dayroom  Visual Appearance Calm  Behavior Composed  Sleep (Behavioral Health Patients Only)  Calculate sleep? (Click Yes once per 24 hr at 0600 safety check) Yes  Documented sleep last 24 hours 8

## 2023-10-11 NOTE — Progress Notes (Addendum)
BH MD Outpatient Progress Note  10/13/2023 12:00 PM Sarah Solis  MRN:  [id number]  Assessment:  Sarah Solis presents for follow-up evaluation. Today, 10/13/23, patient is seen after psychiatric discharge for bizarre and disorganized behaviors and thought process in s/o medication nonadherence. While both patient and mother report Risperdal had been of significant benefit, they state adherence was impacted by notable weight gain. She has since been transitioned to [employer] and is tolerating well. On interview, patient remains with negative symptoms although denies any overt positive symptoms of psychosis at this time - will monitor to see how symptoms respond to recent initiation of Abilify. No changes to plan of care at this time outside of restarting Atarax PRN for anxiety. Mom denies any acute concerns and reviewed plan for next injection with both patient and mother.  Plan to RTC in 6 weeks by video.   Identifying Information: Sarah Solis is a 22 y.o. female with a history of recently diagnosed schizophreniform disorder, anxiety, and insomnia who is an established patient with [facility] for management of symptoms of psychosis.   Plan:  # Schizophrenia Past medication trials: Risperdal (weight gain) Status of problem: chronic Interventions: -- Continue Abilify Maintena 400 mg monthly -- Last received 09/30/23; next due 10/28/23 and appt is scheduled -- First episode psychosis workup performed while inpatient September 2023 negative -- ANA-negative, Ceruloplasmin-22.2, RPR non reactive, heavy metal, Vitamin B1 WNL, Head CT noncontrast with no acute findings -- Continue therapy with Stephan Minister, [facility]   # Depression  Anxiety Past medication trials: Remeron, trazodone Status of problem: acute Interventions: -- Continue Prozac 20 mg daily -- Continue propranolol 10 mg daily (also being used for tachycardia) -- RESTART Atarax 25 mg BID PRN anxiety    # Vitamin D deficiency Status of problem: acute Interventions: -- Vitamin D 19.51 09/28/23 -- Continue Vitamin D2 50,000 units weekly (s10/22/24)  # Weight gain on antipsychotic Past medication trials: none Status of problem: acute Interventions: -- Continue metformin 500 mg daily with breakfast -- Diet and exercise interventions extensively discussed   # Metabolic Monitoring Interventions: -- Lipid panel revealing for low HDL otherwise wnl (09/23/23) -- HgbA1c 5.6 (09/23/23)   Patient was given contact information for behavioral health clinic and was instructed to call 911 for emergencies.   Subjective:  Chief Complaint:  Chief Complaint  Patient presents with   Medication Management   Interval History:   Chart review: -- Last seen by this provider 11/12/22. At that time, managed on Risperdal 0.5 mg qAM + 1.5 mg at bedtime, Remeron 15 mg nightly, trazodone 25-50 mg nightly PRN, Atarax 10 mg TID PRN anxiety, metformin 500 mg BIDcc. -- Psychiatric hospitalization 09/24/23-10/02/23 in setting of being off medications for prior 4 months with subsequent bizarre behaviors, disorganized thoughts, hallucinations. Discharged on Abilify Maintena 400 mg IM with need for 9 additional days of Abilify 20 mg PO overlap. Also discharged on Prozac 20 mg daily, metformin 500 mg qAM, propranolol 10 mg BID, Vitamin D2 50000 units weekly.   Today, patient reports she is doing better since getting out of the hospital. When she went into the hospital, was feeling really anxious and a bit low; also felt like her mind was playing tricks on her although has trouble describing.   Reports she been thinking more clearly since getting out of the hospital. Denies AVH, paranoia, IOR. Feels safe at home. Mood has been "better." However, continues to have episodes of anxiety often triggered by thinking about the  future. When anxious, may feel it physically a/e/b heart racing. Denies feeling constant anxiety but  instead "comes and goes" typically lasting anywhere from a few hours to a few days. Denies SI/HI.   Sleeping at least 8 hours nightly; denies trouble falling or staying asleep. Appetite is stable and leveled out since being off Risperdal.   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 Feels transition back to work has gone well.   Tolerated Abilify injection well and amenable to remaining on injection. Confirms adherence with Prozac, metformin, Vitamin D weekly, propranolol (only once a day). Ran out of Abilify PO.   Amenable to restarting Atarax PRN for acute anxiety. Provides consent for this writer to speak to mom.   Spoke with [name] from 10:52AM-11:02AM for total of 10 minutes: She reports Sandy is doing better since discharge from the hospital and no bizarre behaviors. Has returned to work. However still has moments in which she can get overwhelmed. Mom manages her medications and confirms adherence outside of taking propranolol once daily as she felt morning dose made her sedated. She reflects that patient did really well on Risperdal but stopped taking due to Aes.  Reviewed plan for injection appt on 11/21 and she states she has already picked it up; encouraged mom to check with pharmacy that injection will not expire before that date.   Visit Diagnosis:    ICD-10-CM   1. Schizophrenia, unspecified type (HCC)  F20.9     2. Anxiety  F41.9      Past Psychiatric History:  Diagnoses: schizophreniform disorder, anxiety Suicide attempts: denies Hospitalizations: x2 09/24/23-10/02/23 at [facility]; 08/06/22-08/13/22 at [facility] for psychosis Therapy: denies Substance use:  -- EtOH: rarely -- Cannabis: denies; last smoked years ago -- Denies use of cocaine or other stimulants, benzodiazepines, mushrooms, LSD. -- Tobacco: vapes [employer] infrequently  Past Medical History:  Past Medical History:  Diagnosis Date   Schizophrenia [facility])     Past Surgical  History:  Procedure Laterality Date   INCISION AND DRAINAGE PERIRECTAL ABSCESS Left 10/16/2020   Procedure: IRRIGATION AND DEBRIDEMENT PERIRECTAL ABSCESS;  Surgeon: Abigail Miyamoto, MD;  Location: [facility] OR;  Service: General;  Laterality: Left;    Family Psychiatric History:  Father: schizophrenia; never received medication treatment  Family History:  Family History  Problem Relation Age of Onset   Schizophrenia Father     Social History:  Social History   Socioeconomic History   Marital status: Single    Spouse name: Not on file   Number of children: Not on file   Years of education: Not on file   Highest education level: Not on file  Occupational History   Not on file  Tobacco Use   Smoking status: Never   Smokeless tobacco: Never  Vaping Use   Vaping status: Some Days  Substance and Sexual Activity   Alcohol use: Not Currently   Drug use: Not Currently   Sexual activity: Not Currently  Other Topics Concern   Not on file  Social History Narrative   Not currently working but looking to return to work.    Lives with mother, mother's husband, 2 sisters, and brother.   Social Determinants of Health   Financial Resource Strain: Medium Risk (08/31/2022)   Overall Financial Resource Strain (CARDIA)    Difficulty of Paying Living Expenses: Somewhat hard  Food Insecurity: No Food Insecurity (09/23/2023)   Hunger Vital Sign    Worried About Running Out of Food  in the Last Year: Never true    Ran Out of Food in the Last Year: Never true  Transportation Needs: Unmet Transportation Needs (09/23/2023)   PRAPARE - Transportation    Lack of Transportation (Medical): Yes    Lack of Transportation (Non-Medical): Yes  Physical Activity: Inactive (08/31/2022)   Exercise Vital Sign    Days of Exercise per Week: 0 days    Minutes of Exercise per Session: 0 min  Stress: Stress Concern Present (08/31/2022)   [employer] of Occupational Health - Occupational Stress Questionnaire     Feeling of Stress : To some extent  Social Connections: Socially Isolated (08/31/2022)   Social Connection and Isolation Panel [NHANES]    Frequency of Communication with Friends and Family: Never    Frequency of Social Gatherings with Friends and Family: Never    Attends Religious Services: 1 to 4 times per year    Active Member of [employer] or Organizations: No    Attends Banker Meetings: Never    Marital Status: Never married    Allergies: No Known Allergies  Current Medications: Current Outpatient Medications  Medication Sig Dispense Refill   hydrOXYzine (ATARAX) 25 MG tablet Take 1 tablet (25 mg total) by mouth 2 (two) times daily as needed for anxiety. 60 tablet 1   Vitamin D, Ergocalciferol, (DRISDOL) 1.25 MG (50000 UNIT) CAPS capsule Take 1 capsule (50,000 Units total) by mouth every 7 (seven) days. 4 capsule 0   [START ON 11/25/2023] ARIPiprazole ER (ABILIFY MAINTENA) 400 MG PRSY prefilled syringe Inject 400 mg into the muscle every 28 (twenty-eight) days. 1 each 11   FLUoxetine (PROZAC) 20 MG capsule Take 1 capsule (20 mg total) by mouth daily. 30 capsule 1   metFORMIN (GLUCOPHAGE-XR) 500 MG 24 hr tablet Take 1 tablet (500 mg total) by mouth daily with breakfast. 30 tablet 1   propranolol (INDERAL) 10 MG tablet Take 1 tablet (10 mg total) by mouth in the morning. Can take in evening instead if sedating. 30 tablet 1   Current Facility-Administered Medications  Medication Dose Route Frequency Provider Last Rate Last Admin   [START ON 10/28/2023] ARIPiprazole ER (ABILIFY MAINTENA) 400 MG prefilled syringe 400 mg  400 mg Intramuscular Q28 days         ROS: Denies any physical complaints  Objective:  Psychiatric Specialty Exam: There were no vitals taken for this visit.There is no height or weight on file to calculate BMI.  General Appearance: Casual and Well Groomed  Eye Contact:  Good  Speech:  Clear and Coherent and somewhat slowed rate with mild latency   Volume:  Normal  Mood:   "better"  Affect:   Euthymic; blunted  Thought Content:  Denies AVH; paranoia; IOR    Suicidal Thoughts:  No  Homicidal Thoughts:  No  Thought Process:  Overall logical however vague/brief in responses  Orientation:  Full (Time, Place, and Person)    Memory:   Grossly intact  Judgment:  Good  Insight:  Fair  Concentration:  Concentration: Fair  Recall:  NA  Fund of Knowledge: Good  Language: Good  Psychomotor Activity:  Normal  Akathisia:  No  AIMS (if indicated): not done  Assets:  Desire for Improvement Housing Leisure Time Physical Health Social Support Talents/Skills Transportation Vocational/Educational  ADL's:  Intact  Cognition: WNL  Sleep:  Good   PE: General: sits comfortably in view of camera; no acute distress  Pulm: no increased work of breathing on room air  MSK:  all extremity movements appear intact  Neuro: no focal neurological deficits observed  Gait & Station: unable to assess by video   Metabolic Disorder Labs: Lab Results  Component Value Date   HGBA1C 5.6 09/23/2023   MPG 114.02 09/23/2023   MPG 99.67 08/04/2022   No results found for: "PROLACTIN" Lab Results  Component Value Date   CHOL 117 09/23/2023   TRIG 45 09/23/2023   HDL 34 (L) 09/23/2023   CHOLHDL 3.4 09/23/2023   VLDL 9 09/23/2023   LDLCALC 74 09/23/2023   LDLCALC 103 (H) 08/04/2022   Lab Results  Component Value Date   TSH 1.308 09/23/2023   TSH 1.110 08/04/2022    Therapeutic Level Labs: No results found for: "LITHIUM" No results found for: "VALPROATE" No results found for: "CBMZ"  Screenings: AIMS    Flowsheet Row [facility]  AIMS Total Score 0      AUDIT    Flowsheet Row [facility]) [facility] ([facility]  Alcohol Use  Disorder Identification Test Final Score (AUDIT) 2 1      GAD-7    Flowsheet Row Counselor from 09/30/2022 in [facility] Counselor from 08/31/2022 in [facility]  Total GAD-7 Score 6 14      PHQ2-9    Flowsheet Row Counselor from 09/30/2022 in [facility] Counselor from 08/31/2022 in [facility]  PHQ-2 Total Score 2 4  PHQ-9 Total Score 8 13      Flowsheet Row [facility] ED from 09/22/2023 in [facility] Counselor from 08/31/2022 in [facility]  C-SSRS RISK CATEGORY No Risk No Risk No Risk       Collaboration of Care: Collaboration of Care: Medication Management AEB active medication changes, Psychiatrist AEB established with this provider, and Referral or follow-up with counselor/therapist AEB established with individual psychotherapy  Patient/Guardian was advised Release of Information must be obtained prior to any record release in order to collaborate their care with an outside provider. Patient/Guardian was advised if they have not already done so to contact the registration department to sign all necessary forms in order for [place] to release information regarding their care.   Consent: Patient/Guardian gives verbal consent for treatment and assignment of benefits for services provided during this visit. Patient/Guardian expressed understanding and agreed to proceed.   Virtual Visit via Video Note  I connected with Nelwyn Salisbury on 10/13/23 at 10:30 AM EST by a video enabled telemedicine application and verified that I am speaking with the correct person using two identifiers.  Location: Patient: parked car; [place] Provider: remote clinic in [place]   I discussed the limitations of evaluation and management by telemedicine and the  availability of in person appointments. The patient expressed understanding and agreed to proceed.  I discussed the assessment and treatment plan with the patient. The patient was provided an opportunity to ask questions and all were answered. The patient agreed with the plan and demonstrated an understanding of the instructions.   The patient was advised to call back or seek an in-person evaluation if the symptoms worsen or if the condition fails to improve as anticipated.  I provided 45 minutes dedicated to the care of this patient via video on the  date of this encounter to include chart review, face-to-face time with the patient, medication management/counseling, collateral from mother.  Tresa Jolley A Yalitza Teed 10/13/2023, 12:00 PM

## 2023-10-13 ENCOUNTER — Encounter (HOSPITAL_COMMUNITY): Payer: Self-pay | Admitting: Psychiatry

## 2023-10-13 ENCOUNTER — Telehealth (HOSPITAL_COMMUNITY): Payer: MEDICAID | Admitting: Psychiatry

## 2023-10-13 DIAGNOSIS — F419 Anxiety disorder, unspecified: Secondary | ICD-10-CM

## 2023-10-13 DIAGNOSIS — F209 Schizophrenia, unspecified: Secondary | ICD-10-CM

## 2023-10-13 MED ORDER — ARIPIPRAZOLE ER 400 MG IM PRSY: 400.0000 mg | PREFILLED_SYRINGE | INTRAMUSCULAR | 11 refills | Status: DC

## 2023-10-13 MED ORDER — METFORMIN HCL ER 500 MG PO TB24: 500.0000 mg | ORAL_TABLET | Freq: Every day | ORAL | 1 refills | Status: DC

## 2023-10-13 MED ORDER — FLUOXETINE HCL 20 MG PO CAPS: 20.0000 mg | ORAL_CAPSULE | Freq: Every day | ORAL | 1 refills | Status: DC

## 2023-10-13 MED ORDER — HYDROXYZINE HCL 25 MG PO TABS: 25.0000 mg | ORAL_TABLET | Freq: Two times a day (BID) | ORAL | 1 refills | Status: DC | PRN

## 2023-10-13 MED ORDER — PROPRANOLOL HCL 10 MG PO TABS: 10.0000 mg | ORAL_TABLET | Freq: Every morning | ORAL | 1 refills | Status: DC

## 2023-10-13 MED ORDER — ARIPIPRAZOLE ER 400 MG IM PRSY
400.0000 mg | PREFILLED_SYRINGE | INTRAMUSCULAR | Status: AC
Start: 2023-10-28 — End: 2024-09-27
  Administered 2023-10-28 – 2024-02-23 (×5): 400 mg via INTRAMUSCULAR

## 2023-10-13 NOTE — Patient Instructions (Signed)
Thank you for attending your appointment today.  -- You are scheduled for your next injection appointment on 11/21.  -- START hydroxyzine 25 mg twice daily as needed for acute anxiety -- Continue other medications as prescribed.  Please do not make any changes to medications without first discussing with your provider. If you are experiencing a psychiatric emergency, please call 911 or present to your nearest emergency department. Additional crisis, medication management, and therapy resources are included below.  [facility]  [address] [place], [place] [address] [phone] WALK-IN URGENT CARE 24/7 FOR ANYONE [address] [place], [place]  [phone] Fax: [phone] [email or web address] *Interpreters available *Accepts all insurance and uninsured for Urgent Care needs *Accepts Medicaid and uninsured for outpatient treatment (below)      ONLY FOR [facility]  Below:    Outpatient New Patient Assessment/Therapy Walk-ins:        Monday -Thursday 8am until slots are full.        Every Friday 1pm-4pm  (first come, first served)                   New Patient Psychiatry/Medication Management        Monday-Friday 8am-11am (first come, first served)               For all walk-ins we ask that you arrive by 7:15am, because patients will be seen in the order of arrival.

## 2023-10-28 ENCOUNTER — Other Ambulatory Visit (HOSPITAL_COMMUNITY): Payer: Self-pay | Admitting: Psychiatry

## 2023-10-28 ENCOUNTER — Ambulatory Visit (HOSPITAL_COMMUNITY): Payer: MEDICAID

## 2023-10-28 ENCOUNTER — Encounter (HOSPITAL_COMMUNITY): Payer: Self-pay

## 2023-10-28 VITALS — BP 115/75 | HR 88 | Wt 211.0 lb

## 2023-10-28 DIAGNOSIS — F200 Paranoid schizophrenia: Secondary | ICD-10-CM

## 2023-10-28 DIAGNOSIS — G4700 Insomnia, unspecified: Secondary | ICD-10-CM

## 2023-10-28 DIAGNOSIS — F411 Generalized anxiety disorder: Secondary | ICD-10-CM

## 2023-10-28 MED ORDER — ARIPIPRAZOLE ER 400 MG IM PRSY: 400.0000 mg | PREFILLED_SYRINGE | INTRAMUSCULAR | 11 refills | Status: AC

## 2023-10-28 NOTE — Progress Notes (Cosign Needed)
Patient presented to the office for Abilify maintenna 400 MG injection . Pt tolerated injection well in the LEFT DELTOID . Pt stated that she is doing really well on her LAI and looks forward to coming back for the next one .

## 2023-11-19 NOTE — Progress Notes (Unsigned)
BH MD Outpatient Progress Note  11/19/2023 3:49 PM Sarah Solis  MRN:  [id number]  Assessment:  Sarah Solis presents for follow-up evaluation. Today, 11/19/23, patient is seen after psychiatric discharge for bizarre and disorganized behaviors and thought process in s/o medication nonadherence. While both patient and mother report Risperdal had been of significant benefit, they state adherence was impacted by notable weight gain. She has since been transitioned to [employer] and is tolerating well. On interview, patient remains with negative symptoms although denies any overt positive symptoms of psychosis at this time - will monitor to see how symptoms respond to recent initiation of Abilify. No changes to plan of care at this time outside of restarting Atarax PRN for anxiety. Mom denies any acute concerns and reviewed plan for next injection with both patient and mother.  Plan to RTC in 6 weeks by video.   Identifying Information: Sarah Solis is a 22 y.o. female with a history of recently diagnosed schizophreniform disorder, anxiety, and insomnia who is an established patient with [facility] for management of symptoms of psychosis.   Plan:  # Schizophrenia Past medication trials: Risperdal (weight gain) Status of problem: chronic Interventions: -- Continue Abilify Maintena 400 mg monthly -- First episode psychosis workup performed while inpatient September 2023 negative -- ANA-negative, Ceruloplasmin-22.2, RPR non reactive, heavy metal, Vitamin B1 WNL, Head CT noncontrast with no acute findings -- Continue therapy with Stephan Minister, [facility]   # Depression  Anxiety Past medication trials: Remeron, trazodone Status of problem: acute Interventions: -- Continue Prozac 20 mg daily -- Continue propranolol 10 mg daily (also being used for tachycardia) -- RESTART Atarax 25 mg BID PRN anxiety   # Vitamin D deficiency Status of problem:  acute Interventions: -- Vitamin D 19.51 09/28/23 -- Continue Vitamin D2 50,000 units weekly (s10/22/24)  # Weight gain on antipsychotic Past medication trials: none Status of problem: acute Interventions: -- Continue metformin 500 mg daily with breakfast -- Diet and exercise interventions extensively discussed   # Metabolic Monitoring Interventions: -- Lipid panel revealing for low HDL otherwise wnl (09/23/23) -- HgbA1c 5.6 (09/23/23)   Patient was given contact information for behavioral health clinic and was instructed to call 911 for emergencies.   Subjective:  Chief Complaint:  No chief complaint on file.  Interval History:    Abilify injection Mood, anxiety Atarax prn Avh, paranoia Work Vitamin d weekly - start 1000 IU daily     Visit Diagnosis:  No diagnosis found.  Past Psychiatric History:  Diagnoses: schizophreniform disorder, anxiety Suicide attempts: denies Hospitalizations: x2 09/24/23-10/02/23 at [facility]; 08/06/22-08/13/22 at [facility] for psychosis Therapy: denies Substance use:  -- EtOH: rarely -- Cannabis: denies; last smoked years ago -- Denies use of cocaine or other stimulants, benzodiazepines, mushrooms, LSD. -- Tobacco: vapes [employer] infrequently  Past Medical History:  Past Medical History:  Diagnosis Date   Schizophrenia [facility])     Past Surgical History:  Procedure Laterality Date   INCISION AND DRAINAGE PERIRECTAL ABSCESS Left 10/16/2020   Procedure: IRRIGATION AND DEBRIDEMENT PERIRECTAL ABSCESS;  Surgeon: Abigail Miyamoto, MD;  Location: [facility] OR;  Service: General;  Laterality: Left;    Family Psychiatric History:  Father: schizophrenia; never received medication treatment  Family History:  Family History  Problem Relation Age of Onset   Schizophrenia Father     Social History:  Social History   Socioeconomic History   Marital status: Single    Spouse name: Not on file   Number of children: Not  on file   Years of education: Not  on file   Highest education level: Not on file  Occupational History   Not on file  Tobacco Use   Smoking status: Never   Smokeless tobacco: Never  Vaping Use   Vaping status: Some Days  Substance and Sexual Activity   Alcohol use: Not Currently   Drug use: Not Currently   Sexual activity: Not Currently  Other Topics Concern   Not on file  Social History Narrative   Not currently working but looking to return to work.    Lives with mother, mother's husband, 2 sisters, and brother.   Social Drivers of Health   Financial Resource Strain: Medium Risk (08/31/2022)   Overall Financial Resource Strain (CARDIA)    Difficulty of Paying Living Expenses: Somewhat hard  Food Insecurity: No Food Insecurity (09/23/2023)   Hunger Vital Sign    Worried About Running Out of Food in the Last Year: Never true    Ran Out of Food in the Last Year: Never true  Transportation Needs: Unmet Transportation Needs (09/23/2023)   PRAPARE - Administrator, Civil Service (Medical): Yes    Lack of Transportation (Non-Medical): Yes  Physical Activity: Inactive (08/31/2022)   Exercise Vital Sign    Days of Exercise per Week: 0 days    Minutes of Exercise per Session: 0 min  Stress: Stress Concern Present (08/31/2022)   Sarah Solis of Occupational Health - Occupational Stress Questionnaire    Feeling of Stress : To some extent  Social Connections: Socially Isolated (08/31/2022)   Social Connection and Isolation Panel [NHANES]    Frequency of Communication with Friends and Family: Never    Frequency of Social Gatherings with Friends and Family: Never    Attends Religious Services: 1 to 4 times per year    Active Member of [employer] or Organizations: No    Attends Banker Meetings: Never    Marital Status: Never married    Allergies: No Known Allergies  Current Medications: Current Outpatient Medications  Medication Sig Dispense Refill   [START ON 11/25/2023] ARIPiprazole ER  (ABILIFY MAINTENA) 400 MG PRSY prefilled syringe Inject 400 mg into the muscle every 28 (twenty-eight) days. 1 each 11   FLUoxetine (PROZAC) 20 MG capsule Take 1 capsule (20 mg total) by mouth daily. 30 capsule 1   hydrOXYzine (ATARAX) 25 MG tablet Take 1 tablet (25 mg total) by mouth 2 (two) times daily as needed for anxiety. 60 tablet 1   metFORMIN (GLUCOPHAGE-XR) 500 MG 24 hr tablet Take 1 tablet (500 mg total) by mouth daily with breakfast. 30 tablet 1   propranolol (INDERAL) 10 MG tablet Take 1 tablet (10 mg total) by mouth in the morning. Can take in evening instead if sedating. 30 tablet 1   Vitamin D, Ergocalciferol, (DRISDOL) 1.25 MG (50000 UNIT) CAPS capsule Take 1 capsule (50,000 Units total) by mouth every 7 (seven) days. 4 capsule 0   Current Facility-Administered Medications  Medication Dose Route Frequency Provider Last Rate Last Admin   ARIPiprazole ER (ABILIFY MAINTENA) 400 MG prefilled syringe 400 mg  400 mg Intramuscular Q28 days    400 mg at 10/28/23 1446    ROS: Denies any physical complaints  Objective:  Psychiatric Specialty Exam: There were no vitals taken for this visit.There is no height or weight on file to calculate BMI.  General Appearance: Casual and Well Groomed  Eye Contact:  Good  Speech:  Clear and Coherent and  somewhat slowed rate with mild latency  Volume:  Normal  Mood:   "better"  Affect:   Euthymic; blunted  Thought Content:  Denies AVH; paranoia; IOR    Suicidal Thoughts:  No  Homicidal Thoughts:  No  Thought Process:  Overall logical however vague/brief in responses  Orientation:  Full (Time, Place, and Person)    Memory:   Grossly intact  Judgment:  Good  Insight:  Fair  Concentration:  Concentration: Fair  Recall:  NA  Fund of Knowledge: Good  Language: Good  Psychomotor Activity:  Normal  Akathisia:  No  AIMS (if indicated): not done  Assets:  Desire for Improvement Housing Leisure Time Physical Health Social  Support Talents/Skills Transportation Vocational/Educational  ADL's:  Intact  Cognition: WNL  Sleep:  Good   PE: General: sits comfortably in view of camera; no acute distress  Pulm: no increased work of breathing on room air  MSK: all extremity movements appear intact  Neuro: no focal neurological deficits observed  Gait & Station: unable to assess by video   Metabolic Disorder Labs: Lab Results  Component Value Date   HGBA1C 5.6 09/23/2023   MPG 114.02 09/23/2023   MPG 99.67 08/04/2022   No results found for: "PROLACTIN" Lab Results  Component Value Date   CHOL 117 09/23/2023   TRIG 45 09/23/2023   HDL 34 (L) 09/23/2023   CHOLHDL 3.4 09/23/2023   VLDL 9 09/23/2023   LDLCALC 74 09/23/2023   LDLCALC 103 (H) 08/04/2022   Lab Results  Component Value Date   TSH 1.308 09/23/2023   TSH 1.110 08/04/2022    Therapeutic Level Labs: No results found for: "LITHIUM" No results found for: "VALPROATE" No results found for: "CBMZ"  Screenings: AIMS    Flowsheet Row [facility]  AIMS Total Score 0      AUDIT    Flowsheet Row [facility]) [facility] ([facility]  Alcohol Use Disorder Identification Test Final Score (AUDIT) 2 1      GAD-7    Flowsheet Row Counselor from 09/30/2022 in [facility] Counselor from 08/31/2022 in [facility]  Total GAD-7 Score 6 14      PHQ2-9    Flowsheet Row Counselor from 09/30/2022 in [facility] Counselor from 08/31/2022 in [facility]  PHQ-2 Total Score 2 4  PHQ-9 Total Score 8 13      Flowsheet Row [facility] ED from 09/22/2023  in [facility] Counselor from 08/31/2022 in [facility]  C-SSRS RISK CATEGORY No Risk No Risk No Risk       Collaboration of Care: Collaboration of Care: Medication Management AEB active medication changes, Psychiatrist AEB established with this provider, and Referral or follow-up with counselor/therapist AEB established with individual psychotherapy  Patient/Guardian was advised Release of Information must be obtained prior to any record release in order to collaborate their care with an outside provider. Patient/Guardian was advised if they have not already done so to contact the registration department to sign all necessary forms in order for [place] to release information regarding their care.   Consent: Patient/Guardian gives verbal consent for treatment and assignment of benefits for services provided during this visit. Patient/Guardian expressed understanding and  agreed to proceed.   Virtual Visit via Video Note  I connected with Nelwyn Salisbury on 11/19/23 at  9:30 AM EST by a video enabled telemedicine application and verified that I am speaking with the correct person using two identifiers.  Location: Patient: parked car; [employer] *** Provider: remote clinic in [place]   I discussed the limitations of evaluation and management by telemedicine and the availability of in person appointments. The patient expressed understanding and agreed to proceed.  I discussed the assessment and treatment plan with the patient. The patient was provided an opportunity to ask questions and all were answered. The patient agreed with the plan and demonstrated an understanding of the instructions.   The patient was advised to call back or seek an in-person evaluation if the symptoms worsen or if the condition fails to improve as anticipated.  I provided *** minutes dedicated to the care of this patient via video on the date of this encounter to include chart  review, face-to-face time with the patient, medication management/counseling, collateral from mother.  Annalaya Wile A Relda Agosto 11/19/2023, 3:49 PM

## 2023-11-22 ENCOUNTER — Telehealth (HOSPITAL_COMMUNITY): Payer: MEDICAID | Admitting: Psychiatry

## 2023-11-24 ENCOUNTER — Ambulatory Visit (HOSPITAL_COMMUNITY): Payer: MEDICAID | Admitting: Mental Health

## 2023-11-25 ENCOUNTER — Encounter (HOSPITAL_COMMUNITY): Payer: Self-pay

## 2023-11-25 ENCOUNTER — Ambulatory Visit (HOSPITAL_COMMUNITY): Payer: MEDICAID

## 2023-11-25 VITALS — BP 127/81 | HR 97 | Ht 66.0 in | Wt 217.0 lb

## 2023-11-25 DIAGNOSIS — F2081 Schizophreniform disorder: Secondary | ICD-10-CM | POA: Diagnosis not present

## 2023-11-25 NOTE — Progress Notes (Cosign Needed)
Patient arrived at location unscheduled originally. Patient was given ABILIFY- 400 MG injection, in which she tolerated the shot in right deltoid. Patient has no SI, AVH, or HI. No complaints were given by the patient. Patient appears to be presented well groomed good conversation/ interactions. Patient wants to see if she can switch to an oral medication with provider at next appointment.   [id number] LOT  NUMBER: [id number] EXP-03/2024

## 2023-12-02 NOTE — Progress Notes (Signed)
BH MD Outpatient Progress Note  12/06/2023 10:00 AM Sarah Solis  MRN:  [id number]  Assessment:  Sarah Solis presents for follow-up evaluation. Today, 12/06/23, patient reports improvement in mood and anxiety as well as resolution of prior symptoms of psychosis. She demonstrates improvement in previously observed negative symptoms a/e/b improved spontaneity in speech and affective reactivity. She is tolerating Abilify Maintena well although reports dislike for once a month dosing; due to historical issues with compliance reviewed with patient benefits/risks of injection vs. oral antipsychotic and she was reassured by possibility of Q2 month injection in the future if stability is maintained. Will not make any changes to injection at this visit given upcoming job transition and recent stability with plan to reassess at future visit.   Plan to RTC in 2 months by video.   Identifying Information: Sarah Solis is a 22 y.o. female with a history of schizophrenia, anxiety, and insomnia who is an established patient with [facility] for management of symptoms of psychosis.   Plan:  # Schizophrenia Past medication trials: Risperdal (weight gain) Status of problem: improving Interventions: -- Continue Abilify Maintena 400 mg monthly  -- Patient expresses interest in possibly transitioning to Asimtufii for Q2 month dosing in the future; will revisit in next few visits if stability persists  -- First episode psychosis workup performed while inpatient September 2023 negative -- ANA-negative, Ceruloplasmin-22.2, RPR non reactive, heavy metal, Vitamin B1 WNL, Head CT noncontrast with no acute findings -- Continue therapy with Sarah Solis, [facility]   # Depression  GAD Past medication trials: Remeron, trazodone Status of problem: improving Interventions: -- Continue Prozac 20 mg daily -- Continue Atarax 25 mg BID PRN anxiety -- Patient no longer taking propranolol 10 mg  daily; recent heart rate has been normal and will continue to monitor for need to restart if tachycardia returns   # Vitamin D deficiency Status of problem: acute Interventions: -- Vitamin D 19.51 09/28/23 -- Continue Vitamin D2 50,000 units weekly (s10/22/24) -- START Vitamin D3 1000 IU daily  # Weight gain on antipsychotic Past medication trials: none Status of problem: acute Interventions: -- Continue metformin 500 mg daily with breakfast -- Diet and exercise interventions extensively discussed   # Metabolic Monitoring Interventions: -- Lipid panel revealing for low HDL otherwise wnl (09/23/23) -- HgbA1c 5.6 (09/23/23)   Patient was given contact information for behavioral health clinic and was instructed to call 911 for emergencies.   Subjective:  Chief Complaint:  Chief Complaint  Patient presents with   Medication Management   Interval History:   Patient reports she is doing "fine" and describes mood as "good" and denies feeling depressed Solis significantly anxious. Denies SI. Denies HI. Denies AVH, paranoia. Feels her thinking has been clear.   Has not been using propranolol as she didn't find it helpful for anxiety; has not taken in the last month. Has not been taking metformin consistently however does find it helpful for appetite; has found it difficult to take with food. Denies nausea Solis diarrhea when taken. Using Atarax about once at night to help with sleep; rarely using during the day.  Mom makes sure she takes Prozac every day; finds it helpful for mood.   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 States it will be more out of her comfort zone as it will involve more social interaction but looking forward to increased pay.   She feels injection has been working well  for maintaining stability although would prefer pills as she doesn't like attending monthly injection appt. Discussed option of Q2 month dosing the  future and she would like to discuss this at future visit. Given upcoming job change, amenable to remaining on Maintena for time being and reassessing as stability is maintained.  Visit Diagnosis:    ICD-10-CM   1. Schizophrenia, paranoid (HCC)  F20.0     2. Generalized anxiety disorder  F41.1       Past Psychiatric History:  Diagnoses: schizophrenia, anxiety Suicide attempts: denies Hospitalizations: x2 09/24/23-10/02/23 at [facility]; 08/06/22-08/13/22 at [facility] for psychosis Substance use:  -- EtOH: rarely -- Cannabis: denies; last smoked years ago -- Denies use of cocaine Solis other stimulants, benzodiazepines, mushrooms, LSD. -- Tobacco: vapes Sarah Solis infrequently  Past Medical History:  Past Medical History:  Diagnosis Date   Schizophrenia Sarah Solis)     Past Surgical History:  Procedure Laterality Date   INCISION AND DRAINAGE PERIRECTAL ABSCESS Left 10/16/2020   Procedure: IRRIGATION AND DEBRIDEMENT PERIRECTAL ABSCESS;  Surgeon: Sarah Miyamoto, MD;  Location: [facility];  Service: General;  Laterality: Left;    Family Psychiatric History:  Father: schizophrenia; never received medication treatment  Family History:  Family History  Problem Relation Age of Onset   Schizophrenia Father     Social History:  Social History   Socioeconomic History   Marital status: Single    Spouse name: Not on file   Number of children: Not on file   Years of education: Not on file   Highest education level: Not on file  Occupational History   Not on file  Tobacco Use   Smoking status: Never   Smokeless tobacco: Never  Vaping Use   Vaping status: Some Days  Substance and Sexual Activity   Alcohol use: Not Currently   Drug use: Not Currently   Sexual activity: Not Currently  Other Topics Concern   Not on file  Social History Narrative   Not currently working but looking to return to work.    Lives with mother, mother's husband, 2 sisters, and brother.   Social Drivers of Health    Financial Resource Strain: Medium Risk (08/31/2022)   Overall Financial Resource Strain (CARDIA)    Difficulty of Paying Living Expenses: Somewhat hard  Food Insecurity: No Food Insecurity (09/23/2023)   Hunger Vital Sign    Worried About Running Out of Food in the Last Year: Never true    Ran Out of Food in the Last Year: Never true  Transportation Needs: Unmet Transportation Needs (09/23/2023)   PRAPARE - Administrator, Civil Service (Medical): Yes    Lack of Transportation (Non-Medical): Yes  Physical Activity: Inactive (08/31/2022)   Exercise Vital Sign    Days of Exercise per Week: 0 days    Minutes of Exercise per Session: 0 min  Stress: Stress Concern Present (08/31/2022)   [employer] of Occupational Health - Occupational Stress Questionnaire    Feeling of Stress : To some extent  Social Connections: Socially Isolated (08/31/2022)   Social Connection and Isolation Panel [NHANES]    Frequency of Communication with Friends and Family: Never    Frequency of Social Gatherings with Friends and Family: Never    Attends Religious Services: 1 to 4 times per year    Active Member of [employer] [facility] Organizations: No    Attends Banker Meetings: Never    Marital Status: Never married    Allergies: No Known Allergies  Current  Medications: Current Outpatient Medications  Medication Sig Dispense Refill   Cholecalciferol (VITAMIN D-3) 25 MCG (1000 UT) CAPS Take 1 capsule (1,000 Units total) by mouth daily in the afternoon. 30 capsule 5   ARIPiprazole ER (ABILIFY MAINTENA) 400 MG PRSY prefilled syringe Inject 400 mg into the muscle every 28 (twenty-eight) days. 1 each 11   FLUoxetine (PROZAC) 20 MG capsule Take 1 capsule (20 mg total) by mouth daily. 30 capsule 2   hydrOXYzine (ATARAX) 25 MG tablet Take 1 tablet (25 mg total) by mouth 2 (two) times daily as needed for anxiety ([facility] sleep). 60 tablet 2   metFORMIN (GLUCOPHAGE-XR) 500 MG 24 hr tablet Take 1 tablet  (500 mg total) by mouth in the morning. 30 tablet 2   propranolol (INDERAL) 10 MG tablet Take 1 tablet (10 mg total) by mouth in the morning. Can take in evening instead if sedating. 30 tablet 1   Current Facility-Administered Medications  Medication Dose Route Frequency Provider Last Rate Last Admin   ARIPiprazole ER (ABILIFY MAINTENA) 400 MG prefilled syringe 400 mg  400 mg Intramuscular Q28 days    400 mg at 11/25/23 1622    ROS: Denies any physical complaints  Objective:  Psychiatric Specialty Exam: There were no vitals taken for this visit.There is no height Solis weight on file to calculate BMI.  General Appearance: Casual and Well Groomed  Eye Contact:  Good  Speech:  Clear and Coherent and Normal Rate  Volume:  Normal  Mood:   "good"  Affect:   Euthymic; full range; appropriately smiles  Thought Content:  Denies AVH; paranoia; IOR    Suicidal Thoughts:  No  Homicidal Thoughts:  No  Thought Process:  Linear/logical  Orientation:  Full (Time, Place, and Person)    Memory:   Grossly intact  Judgment:  Good  Insight:  Fair  Concentration:  Concentration: Good  Recall:  NA  Fund of Knowledge: Good  Language: Good  Psychomotor Activity:  Normal  Akathisia:  No  AIMS (if indicated): not done  Assets:  Desire for Improvement Housing Leisure Time Physical Health Social Support Talents/Skills Transportation Vocational/Educational  ADL's:  Intact  Cognition: WNL  Sleep:  Good   PE: General: sits comfortably in view of camera; no acute distress  Pulm: no increased work of breathing on room air  MSK: all extremity movements appear intact  Neuro: no focal neurological deficits observed  Gait & Station: unable to assess by video   Metabolic Disorder Labs: Lab Results  Component Value Date   HGBA1C 5.6 09/23/2023   MPG 114.02 09/23/2023   MPG 99.67 08/04/2022   No results found for: "PROLACTIN" Lab Results  Component Value Date   CHOL 117 09/23/2023   TRIG 45  09/23/2023   HDL 34 (L) 09/23/2023   CHOLHDL 3.4 09/23/2023   VLDL 9 09/23/2023   LDLCALC 74 09/23/2023   LDLCALC 103 (H) 08/04/2022   Lab Results  Component Value Date   TSH 1.308 09/23/2023   TSH 1.110 08/04/2022    Therapeutic Level Labs: No results found for: "LITHIUM" No results found for: "VALPROATE" No results found for: "CBMZ"  Screenings: AIMS    Flowsheet Row [facility]  AIMS Total Score 0      AUDIT    Flowsheet Row [facility] Ad[facility] ([facility]  Alcohol Use Disorder  Identification Test Final Score (AUDIT) 2 1      GAD-7    Flowsheet Row Counselor from 09/30/2022 in [facility] Counselor from 08/31/2022 in [facility]  Total GAD-7 Score 6 14      PHQ2-9    Flowsheet Row Counselor from 09/30/2022 in [facility] Counselor from 08/31/2022 in [facility]  PHQ-2 Total Score 2 4  PHQ-9 Total Score 8 13      Flowsheet Row [facility] ED from 09/22/2023 in [facility] Counselor from 08/31/2022 in [facility]  C-SSRS RISK CATEGORY No Risk No Risk No Risk       Collaboration of Care: Collaboration of Care: Medication Management AEB active medication changes, Psychiatrist AEB established with this provider, and Referral Solis follow-up with counselor/therapist AEB established with individual psychotherapy  Patient/Guardian was advised Release of Information must be obtained prior to any record release in order to collaborate their care with an outside provider. Patient/Guardian was advised if they have  not already done so to contact the registration department to sign all necessary forms in order for [place] to release information regarding their care.   Consent: Patient/Guardian gives verbal consent for treatment and assignment of benefits for services provided during this visit. Patient/Guardian expressed understanding and agreed to proceed.   Virtual Visit via Video Note  I connected with Nelwyn Salisbury on 12/06/23 at  9:30 AM EST by a video enabled telemedicine application and verified that I am speaking with the correct person using two identifiers.  Location: Patient: private location outside workplace in [place] Provider: remote clinic in [place]   I discussed the limitations of evaluation and management by telemedicine and the availability of in person appointments. The patient expressed understanding and agreed to proceed.  I discussed the assessment and treatment plan with the patient. The patient was provided an opportunity to ask questions and all were answered. The patient agreed with the plan and demonstrated an understanding of the instructions.   The patient was advised to call back Solis seek an in-person evaluation if the symptoms worsen Solis if the condition fails to improve as anticipated.  I provided 30 minutes dedicated to the care of this patient via video on the date of this encounter to include chart review, face-to-face time with the patient, medication management/counseling.  Lewi Drost A Mayfield Schoene 12/06/2023, 10:00 AM

## 2023-12-06 ENCOUNTER — Telehealth (HOSPITAL_COMMUNITY): Payer: MEDICAID | Admitting: Psychiatry

## 2023-12-06 ENCOUNTER — Encounter (HOSPITAL_COMMUNITY): Payer: Self-pay | Admitting: Psychiatry

## 2023-12-06 DIAGNOSIS — F411 Generalized anxiety disorder: Secondary | ICD-10-CM

## 2023-12-06 DIAGNOSIS — F200 Paranoid schizophrenia: Secondary | ICD-10-CM

## 2023-12-06 MED ORDER — VITAMIN D-3 25 MCG (1000 UT) PO CAPS: 1000.0000 [IU] | ORAL_CAPSULE | Freq: Every day | ORAL | 5 refills | Status: DC

## 2023-12-06 MED ORDER — METFORMIN HCL ER 500 MG PO TB24: 500.0000 mg | ORAL_TABLET | Freq: Every morning | ORAL | 2 refills | Status: DC

## 2023-12-06 MED ORDER — HYDROXYZINE HCL 25 MG PO TABS: 25.0000 mg | ORAL_TABLET | Freq: Two times a day (BID) | ORAL | 2 refills | Status: DC | PRN

## 2023-12-06 MED ORDER — FLUOXETINE HCL 20 MG PO CAPS: 20.0000 mg | ORAL_CAPSULE | Freq: Every day | ORAL | 2 refills | Status: DC

## 2023-12-06 NOTE — Patient Instructions (Signed)
Thank you for attending your appointment today.  -- START Vitamin D3 1000 IU daily -- Continue other medications as prescribed.  Please do not make any changes to medications without first discussing with your provider. If you are experiencing a psychiatric emergency, please call 911 or present to your nearest emergency department. Additional crisis, medication management, and therapy resources are included below.  [facility]  [address], [place], [place] [address] [phone] WALK-IN URGENT CARE 24/7 FOR ANYONE [address] [place], [place]  [phone] Fax: [phone] [email or web address] *Interpreters available *Accepts all insurance and uninsured for Urgent Care needs *Accepts Medicaid and uninsured for outpatient treatment (below)      ONLY FOR [facility]  Below:    Outpatient New Patient Assessment/Therapy Walk-ins:        Monday, Wednesday, and Thursday 8am until slots are full (first come, first served)                   New Patient Psychiatry/Medication Management        Monday-Friday 8am-11am (first come, first served)               For all walk-ins we ask that you arrive by 7:15am, because patients will be seen in the order of arrival.

## 2023-12-14 ENCOUNTER — Telehealth (HOSPITAL_COMMUNITY): Payer: Self-pay

## 2023-12-14 ENCOUNTER — Telehealth (HOSPITAL_COMMUNITY): Payer: Self-pay | Admitting: *Deleted

## 2023-12-14 NOTE — Telephone Encounter (Signed)
 Medication management - Prior authorization for patient's prescribed Abilify Maintena 400 mg IM injection every 28 days submitted online with CoverMyMeds and sent to [employer] for review and pending decision.

## 2023-12-14 NOTE — Telephone Encounter (Signed)
 Fax received for approval of Abilify Maintena 400mg  until 12/13/24. Pharmacy notified.

## 2023-12-23 ENCOUNTER — Ambulatory Visit (HOSPITAL_COMMUNITY): Payer: MEDICAID

## 2023-12-28 ENCOUNTER — Ambulatory Visit (HOSPITAL_COMMUNITY): Payer: MEDICAID

## 2023-12-29 ENCOUNTER — Ambulatory Visit (INDEPENDENT_AMBULATORY_CARE_PROVIDER_SITE_OTHER): Payer: MEDICAID

## 2023-12-29 ENCOUNTER — Encounter (HOSPITAL_COMMUNITY): Payer: Self-pay

## 2023-12-29 VITALS — BP 116/71 | HR 74 | Wt 223.6 lb

## 2023-12-29 DIAGNOSIS — F411 Generalized anxiety disorder: Secondary | ICD-10-CM

## 2023-12-29 DIAGNOSIS — G4700 Insomnia, unspecified: Secondary | ICD-10-CM | POA: Diagnosis not present

## 2023-12-29 DIAGNOSIS — F200 Paranoid schizophrenia: Secondary | ICD-10-CM | POA: Diagnosis not present

## 2023-12-29 NOTE — Progress Notes (Cosign Needed)
Patient presented to the office for Abilify maintenna 400 MG injection . Pt tolerated injection well in the RIGHT DELTOID . Pt stated that she is doing really well on her LAI and looks forward to coming back for the next one .

## 2024-01-10 ENCOUNTER — Ambulatory Visit (HOSPITAL_COMMUNITY): Payer: MEDICAID | Admitting: Mental Health

## 2024-01-10 ENCOUNTER — Encounter (HOSPITAL_COMMUNITY): Payer: Self-pay

## 2024-01-20 NOTE — Progress Notes (Signed)
 BH MD Outpatient Progress Note  01/24/2024 10:28 AM [name]  MRN:  [id number]  Assessment:  Cartha Rotert presents for follow-up evaluation. Today, 01/24/24, patient reports overall psychiatric stability without mood disturbance or signs/sx of psychosis. She has recently started new job which she reports is going well. She presents as somewhat constricted on interview however overall has improved spontaneity in speech and affective reactivity compared to initial visit after hospitalization. She has continued to receive Abilify Maintena although again expresses interest in possibly transitioning back to oral medication; due to historical issues with compliance reviewed with patient benefits/risks of injection vs. oral antipsychotic. She is amenable to remaining on monthly injection for time being and will review option of Q2 month injection at next visit if stability is maintained.  Plan to RTC in 2 months by video.   Identifying Information: Sarah Solis is a 23 y.o. female with a history of schizophrenia, anxiety, and insomnia who is an established patient with [facility] for management of symptoms of psychosis.   Plan:  # Schizophrenia Past medication trials: Risperdal (weight gain) Status of problem: stable Interventions: -- Continue Abilify Maintena 400 mg monthly  -- Patient expresses interest in possibly transitioning to Asimtufii for Q2 month dosing in the future; will revisit at next visit if stability persists  -- First episode psychosis workup performed while inpatient September 2023 negative -- ANA-negative, Ceruloplasmin-22.2, RPR non reactive, heavy metal, Vitamin B1 WNL, Head CT noncontrast with no acute findings -- Patient has deferred psychotherapy for time being   # Depression  GAD Past medication trials: Remeron, trazodone, propranolol Status of problem: stable Interventions: -- Continue Prozac 20 mg daily -- Continue Atarax 25 mg BID PRN  anxiety/sleep   # Vitamin D deficiency Status of problem: acute Interventions: -- Vitamin D 19.51 09/28/23 -- S/p Vitamin D2 50,000 units weekly (s10/22/24) -- START Vitamin D3 1000 IU daily (patient again reminded today)  # Weight gain on antipsychotic Past medication trials: none Status of problem: acute Interventions: -- Continue metformin 500 mg daily with breakfast -- Diet and exercise interventions extensively discussed   # Metabolic Monitoring Interventions: -- Lipid panel revealing for low HDL otherwise wnl (09/23/23) -- HgbA1c 5.6 (09/23/23)   # Healthcare maintenance -- Referral placed to [facility] to establish PCP  Patient was given contact information for behavioral health clinic and was instructed to call 911 for emergencies.   Subjective:  Chief Complaint:  Chief Complaint  Patient presents with   Medication Management   Interval History:   Patient reports she is doing "fine" and [place] assisting with cleaning and visitors. Describes mood overall as "pretty good" and denies feeling depressed or significantly anxious. Some low energy but able to fulfill responsibilities. Has not been taking vitamin D but plans to do so. Not taking metformin. Appetite has been stable and denies overeating. Reports weight gain has curbed due to more physical activity. Right now, wants to wait to start metformin and focus on diet/exercise.   Ran out of hydroxyzine but did find it helpful for sleep and anxiety in the past. Recently, sleep has been better due to new work hours; getting about 7 hours nightly.   Denies AVH/paranoia, IOR. Denies SI/HI.   Reports she may want to switch back to pill due to muscle soreness in her arm for 1-2 days after injection. Reviewed risks of returning to pill including increased risk for hospitalization if noncompliant. She admits she can have  trouble remembering to take medications; denies any missed doses of  fluoxetine although this is because mom helps her take this medication every day. Identifies desire to live independently one day. Amenable to remaining on injection for time being and reassessing at next visit transition to Q2 month injection vs. Tablet.  She does not have a primary care doctor and interested in getting established for general healthcare maintenance (pap smear, etc).   Visit Diagnosis:    ICD-10-CM   1. Schizophrenia, paranoid (HCC)  F20.0     2. Generalized anxiety disorder  F41.1     3. Healthcare maintenance  Z00.00 Ambulatory referral to Internal Medicine      Past Psychiatric History:  Diagnoses: schizophrenia, anxiety Suicide attempts: denies Hospitalizations: x2 09/24/23-10/02/23 at [facility]; 08/06/22-08/13/22 at [facility] for psychosis Substance use:  -- EtOH: rarely -- Cannabis: denies; last smoked years ago -- Denies use of cocaine or other stimulants, benzodiazepines, mushrooms, LSD. -- Tobacco: vapes El[employer] infrequently  Past Medical History:  Past Medical History:  Diagnosis Date   Schizophrenia [facility])     Past Surgical History:  Procedure Laterality Date   INCISION AND DRAINAGE PERIRECTAL ABSCESS Left 10/16/2020   Procedure: IRRIGATION AND DEBRIDEMENT PERIRECTAL ABSCESS;  Surgeon: Abigail Miyamoto, MD;  Location: [facility] OR;  Service: General;  Laterality: Left;    Family Psychiatric History:  Father: schizophrenia; never received medication treatment  Family History:  Family History  Problem Relation Age of Onset   Schizophrenia Father     Social History:  Social History   Socioeconomic History   Marital status: Single    Spouse name: Not on file   Number of children: Not on file   Years of education: Not on file   Highest education level: Not on file  Occupational History   Not on file  Tobacco Use   Smoking status: Never   Smokeless tobacco: Never  Vaping Use   Vaping status: Some Days  Substance and Sexual Activity   Alcohol use: Not  Currently   Drug use: Not Currently   Sexual activity: Not Currently  Other Topics Concern   Not on file  Social History Narrative   Not currently working but looking to return to work.    Lives with mother, mother's husband, 2 sisters, and brother.   Social Drivers of Health   Financial Resource Strain: Medium Risk (08/31/2022)   Overall Financial Resource Strain (CARDIA)    Difficulty of Paying Living Expenses: Somewhat hard  Food Insecurity: No Food Insecurity (09/23/2023)   Hunger Vital Sign    Worried About Running Out of Food in the Last Year: Never true    Ran Out of Food in the Last Year: Never true  Transportation Needs: Unmet Transportation Needs (09/23/2023)   PRAPARE - Administrator, Civil Service (Medical): Yes    Lack of Transportation (Non-Medical): Yes  Physical Activity: Inactive (08/31/2022)   Exercise Vital Sign    Days of Exercise per Week: 0 days    Minutes of Exercise per Session: 0 min  Stress: Stress Concern Present (08/31/2022)   [employer] of Occupational Health - Occupational Stress Questionnaire    Feeling of Stress : To some extent  Social Connections: Socially Isolated (08/31/2022)   Social Connection and Isolation Panel [NHANES]    Frequency of Communication with Friends and Family: Never    Frequency of Social Gatherings with Friends and Family: Never    Attends Religious Services: 1 to 4 times per year  Active Member of Clubs or Organizations: No    Attends Banker Meetings: Never    Marital Status: Never married    Allergies: No Known Allergies  Current Medications: Current Outpatient Medications  Medication Sig Dispense Refill   ARIPiprazole ER (ABILIFY MAINTENA) 400 MG PRSY prefilled syringe Inject 400 mg into the muscle every 28 (twenty-eight) days. 1 each 11   Cholecalciferol (VITAMIN D-3) 25 MCG (1000 UT) CAPS Take 1 capsule (1,000 Units total) by mouth in the morning. 30 capsule 5   FLUoxetine (PROZAC)  20 MG capsule Take 1 capsule (20 mg total) by mouth daily. 30 capsule 2   hydrOXYzine (ATARAX) 25 MG tablet Take 1 tablet (25 mg total) by mouth 2 (two) times daily as needed for anxiety (or sleep). 60 tablet 2   Current Facility-Administered Medications  Medication Dose Route Frequency Provider Last Rate Last Admin   ARIPiprazole ER (ABILIFY MAINTENA) 400 MG prefilled syringe 400 mg  400 mg Intramuscular Q28 days    400 mg at 12/29/23 1547    ROS: Denies any physical complaints  Objective:  Psychiatric Specialty Exam: There were no vitals taken for this visit.There is no height or weight on file to calculate BMI.  General Appearance: Casual and Well Groomed  Eye Contact:  Good  Speech:  Clear and Coherent and Normal Rate  Volume:  Normal  Mood:   "pretty good"  Affect:   Euthymic; constricted  Thought Content:  Denies AVH; paranoia; IOR    Suicidal Thoughts:  No  Homicidal Thoughts:  No  Thought Process:  Linear/logical although brief  Orientation:  Full (Time, Place, and Person)    Memory:   Grossly intact  Judgment:  Good  Insight:  Fair  Concentration:  Concentration: Good  Recall:  NA  Fund of Knowledge: Good  Language: Good  Psychomotor Activity:  Normal  Akathisia:  No  AIMS (if indicated): not done  Assets:  Desire for Improvement Housing Leisure Time Physical Health Social Support Talents/Skills Transportation Vocational/Educational  ADL's:  Intact  Cognition: WNL  Sleep:  Good   PE: General: sits comfortably in view of camera; no acute distress  Pulm: no increased work of breathing on room air  MSK: all extremity movements appear intact  Neuro: no focal neurological deficits observed  Gait & Station: unable to assess by video   Metabolic Disorder Labs: Lab Results  Component Value Date   HGBA1C 5.6 09/23/2023   MPG 114.02 09/23/2023   MPG 99.67 08/04/2022   No results found for: "PROLACTIN" Lab Results  Component Value Date   CHOL 117  09/23/2023   TRIG 45 09/23/2023   HDL 34 (L) 09/23/2023   CHOLHDL 3.4 09/23/2023   VLDL 9 09/23/2023   LDLCALC 74 09/23/2023   LDLCALC 103 (H) 08/04/2022   Lab Results  Component Value Date   TSH 1.308 09/23/2023   TSH 1.110 08/04/2022    Therapeutic Level Labs: No results found for: "LITHIUM" No results found for: "VALPROATE" No results found for: "CBMZ"  Screenings: AIMS    Flowsheet Row [facility]  AIMS Total Score 0      AUDIT    Flowsheet Row [facility]) [facility] Ad[facility] ([facility]  Alcohol Use Disorder Identification Test Final Score (AUDIT) 2 1      GAD-7    Flowsheet Row Counselor from  09/30/2022 in [facility] Counselor from 08/31/2022 in [facility]  Total GAD-7 Score 6 14      PHQ2-9    Flowsheet Row Counselor from 09/30/2022 in [facility] Counselor from 08/31/2022 in [facility]  PHQ-2 Total Score 2 4  PHQ-9 Total Score 8 13      Flowsheet Row [facility] ED from 09/22/2023 in [facility] Counselor from 08/31/2022 in [facility]  C-SSRS RISK CATEGORY No Risk No Risk No Risk       Collaboration of Care: Collaboration of Care: Medication Management AEB active medication changes, Psychiatrist AEB established with this provider, and Referral or follow-up with counselor/therapist AEB established with individual psychotherapy  Patient/Guardian was advised Release of Information must be obtained prior to any record release in order to collaborate their care with an outside provider. Patient/Guardian was  advised if they have not already done so to contact the registration department to sign all necessary forms in order for [place] to release information regarding their care.   Consent: Patient/Guardian gives verbal consent for treatment and assignment of benefits for services provided during this visit. Patient/Guardian expressed understanding and agreed to proceed.   Virtual Visit via Video Note  I connected with Sarah Solis on 01/24/24 at 10:00 AM EST by a video enabled telemedicine application and verified that I am speaking with the correct person using two identifiers.  Location: Patient: home address in [place] Provider: remote clinic in [place]   I discussed the limitations of evaluation and management by telemedicine and the availability of in person appointments. The patient expressed understanding and agreed to proceed.  I discussed the assessment and treatment plan with the patient. The patient was provided an opportunity to ask questions and all were answered. The patient agreed with the plan and demonstrated an understanding of the instructions.   The patient was advised to call back or seek an in-person evaluation if the symptoms worsen or if the condition fails to improve as anticipated.  I provided 30 minutes dedicated to the care of this patient via video on the date of this encounter to include chart review, face-to-face time with the patient, medication management/counseling.  Lilly Gasser A Grizel Vesely 01/24/2024, 10:28 AM

## 2024-01-24 ENCOUNTER — Telehealth (INDEPENDENT_AMBULATORY_CARE_PROVIDER_SITE_OTHER): Payer: MEDICAID | Admitting: Psychiatry

## 2024-01-24 ENCOUNTER — Encounter (HOSPITAL_COMMUNITY): Payer: Self-pay | Admitting: Psychiatry

## 2024-01-24 DIAGNOSIS — F200 Paranoid schizophrenia: Secondary | ICD-10-CM | POA: Diagnosis not present

## 2024-01-24 DIAGNOSIS — Z0000 Encounter for general adult medical examination without abnormal findings: Secondary | ICD-10-CM

## 2024-01-24 DIAGNOSIS — F411 Generalized anxiety disorder: Secondary | ICD-10-CM

## 2024-01-24 MED ORDER — VITAMIN D-3 25 MCG (1000 UT) PO CAPS: 1000.0000 [IU] | ORAL_CAPSULE | Freq: Every morning | ORAL | 5 refills | Status: AC

## 2024-01-24 MED ORDER — HYDROXYZINE HCL 25 MG PO TABS: 25.0000 mg | ORAL_TABLET | Freq: Two times a day (BID) | ORAL | 2 refills | Status: AC | PRN

## 2024-01-24 MED ORDER — FLUOXETINE HCL 20 MG PO CAPS: 20.0000 mg | ORAL_CAPSULE | Freq: Every day | ORAL | 2 refills | Status: AC

## 2024-01-24 NOTE — Patient Instructions (Signed)

## 2024-01-25 ENCOUNTER — Ambulatory Visit (INDEPENDENT_AMBULATORY_CARE_PROVIDER_SITE_OTHER): Payer: MEDICAID

## 2024-01-25 VITALS — BP 116/85 | HR 79 | Ht 66.0 in | Wt 221.0 lb

## 2024-01-25 DIAGNOSIS — F2089 Other schizophrenia: Secondary | ICD-10-CM | POA: Diagnosis not present

## 2024-01-25 NOTE — Progress Notes (Cosign Needed)
 Pt presents today for injection of Abilify maintena, administered in left deltoid which was tolerated successfully.  Pt states she is doing well and denies AVH, SI, and HI. Patient states that she is recovering from a cold/ flu that she caught not too long ago, other than this she is in good spirits.

## 2024-01-26 ENCOUNTER — Ambulatory Visit (HOSPITAL_COMMUNITY): Payer: MEDICAID

## 2024-02-22 ENCOUNTER — Ambulatory Visit (HOSPITAL_COMMUNITY): Payer: MEDICAID

## 2024-02-23 ENCOUNTER — Ambulatory Visit (INDEPENDENT_AMBULATORY_CARE_PROVIDER_SITE_OTHER): Payer: MEDICAID

## 2024-02-23 VITALS — BP 110/71 | HR 80 | Ht 67.0 in | Wt 221.0 lb

## 2024-02-23 DIAGNOSIS — F2089 Other schizophrenia: Secondary | ICD-10-CM | POA: Diagnosis not present

## 2024-02-23 NOTE — Progress Notes (Cosign Needed)
 PT PRESENTS TODAY FOR INJECTION OF ABILIFY MAINTENNA, WHICH WAS    ADMINISTERED IN Patients right deltoid. With no complaints from PT.  Pt states that she is feeling more tired than usual. Pt was referred to provider. Pt states that she will decide very soon there she will continue [facility] clinic.

## 2024-03-17 NOTE — Progress Notes (Signed)
 Patient did not connect for virtual psychiatric medication management appointment on 03/20/24 at 10:30AM. Sent secure video link with no response. Left VM with callback number to reschedule.  Adell Hones, MD 03/20/24

## 2024-03-20 ENCOUNTER — Encounter (HOSPITAL_COMMUNITY): Payer: MEDICAID | Admitting: Psychiatry

## 2024-03-20 ENCOUNTER — Encounter (HOSPITAL_COMMUNITY): Payer: Self-pay

## 2024-03-22 ENCOUNTER — Ambulatory Visit (HOSPITAL_COMMUNITY): Payer: MEDICAID

## 2024-03-27 ENCOUNTER — Telehealth (HOSPITAL_COMMUNITY): Payer: MEDICAID | Admitting: Psychiatry

## 2024-05-09 ENCOUNTER — Other Ambulatory Visit: Payer: Self-pay

## 2024-05-09 ENCOUNTER — Emergency Department (HOSPITAL_COMMUNITY)
Admission: EM | Admit: 2024-05-09 | Discharge: 2024-05-10 | Disposition: A | Discharge status: Home / Self Care | Payer: Worker's Compensation | Attending: Emergency Medicine | Admitting: Emergency Medicine

## 2024-05-09 DIAGNOSIS — Y990 Civilian activity done for income or pay: Secondary | ICD-10-CM | POA: Diagnosis not present

## 2024-05-09 DIAGNOSIS — Y92410 Unspecified street and highway as the place of occurrence of the external cause: Secondary | ICD-10-CM | POA: Insufficient documentation

## 2024-05-09 DIAGNOSIS — M545 Low back pain, unspecified: Secondary | ICD-10-CM | POA: Diagnosis present

## 2024-05-09 DIAGNOSIS — S39012A Strain of muscle, fascia and tendon of lower back, initial encounter: Secondary | ICD-10-CM
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
## 2024-05-10 ENCOUNTER — Emergency Department (HOSPITAL_COMMUNITY): Payer: MEDICAID

## 2024-05-10 LAB — POC URINE PREG, ED: Preg Test, Ur: NEGATIVE

## 2024-05-10 MED ORDER — METHOCARBAMOL 500 MG PO TABS
500.0000 mg | ORAL_TABLET | Freq: Once | ORAL | Status: AC
  Administered 2024-05-10: 500 mg via ORAL
  Filled 2024-05-10: qty 1

## 2024-05-10 MED ORDER — OXYCODONE-ACETAMINOPHEN 5-325 MG PO TABS
1.0000 | ORAL_TABLET | Freq: Once | ORAL | Status: AC
  Administered 2024-05-10: 1 via ORAL
  Filled 2024-05-10: qty 1

## 2024-05-10 MED ORDER — METHOCARBAMOL 500 MG PO TABS: 500.0000 mg | ORAL_TABLET | Freq: Three times a day (TID) | ORAL | 0 refills | Status: AC | PRN
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
## 2024-05-15 ENCOUNTER — Other Ambulatory Visit: Payer: Self-pay

## 2024-05-15 ENCOUNTER — Encounter (HOSPITAL_COMMUNITY): Payer: Self-pay | Admitting: Emergency Medicine

## 2024-05-15 ENCOUNTER — Ambulatory Visit (HOSPITAL_COMMUNITY)
Admission: EM | Admit: 2024-05-15 | Discharge: 2024-05-15 | Disposition: A | Discharge status: Home / Self Care | Payer: Worker's Compensation | Attending: Family Medicine | Admitting: Family Medicine

## 2024-05-15 DIAGNOSIS — M545 Low back pain, unspecified: Secondary | ICD-10-CM | POA: Diagnosis not present

## 2024-05-15 MED ORDER — KETOROLAC TROMETHAMINE 30 MG/ML IJ SOLN
30.0000 mg | Freq: Once | INTRAMUSCULAR | Status: AC
  Administered 2024-05-15: 30 mg via INTRAMUSCULAR

## 2024-05-15 MED ORDER — KETOROLAC TROMETHAMINE 10 MG PO TABS: 10.0000 mg | ORAL_TABLET | Freq: Four times a day (QID) | ORAL | 0 refills | Status: AC | PRN

## 2024-05-15 MED ORDER — KETOROLAC TROMETHAMINE 30 MG/ML IJ SOLN
INTRAMUSCULAR | Status: AC
  Filled 2024-05-15: qty 1
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
## 2024-05-15 NOTE — Discharge Instructions (Signed)
 You have been given a shot of Toradol 30 mg today.  Ketorolac 10 mg tablets--take 1 tablet every 6 hours as needed for pain.  This is the same medicine that is in the shot we just gave you  You can continue to take the methocarbamol  500 mg at bedtime that she already have.  You can take 1/2 to 1 tablet at bedtime to help you rest.  Keep your appointment with occupational health.
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
## 2024-05-15 NOTE — ED Notes (Signed)
 Repeatedly instructed patient to follow up with occupational health

## 2024-05-17 ENCOUNTER — Other Ambulatory Visit: Payer: Self-pay | Admitting: Nurse Practitioner

## 2024-05-17 ENCOUNTER — Ambulatory Visit
Admission: RE | Admit: 2024-05-17 | Discharge: 2024-05-17 | Disposition: A | Discharge status: Home / Self Care | Payer: Worker's Compensation | Source: Ambulatory Visit | Attending: Nurse Practitioner | Admitting: Nurse Practitioner

## 2024-10-27 ENCOUNTER — Other Ambulatory Visit (HOSPITAL_COMMUNITY): Payer: Self-pay | Admitting: Psychiatry
